# Patient Record
Sex: Male | Born: 1980 | Race: Black or African American | Hispanic: No | Marital: Married | Smoking: Never smoker
Health system: Southern US, Community
[De-identification: ages and names within clinical notes are randomized; demographics above are authoritative.]

## PROBLEM LIST (undated history)

## (undated) DIAGNOSIS — F329 Major depressive disorder, single episode, unspecified: Secondary | ICD-10-CM

## (undated) DIAGNOSIS — I10 Essential (primary) hypertension: Secondary | ICD-10-CM

## (undated) DIAGNOSIS — G4730 Sleep apnea, unspecified: Secondary | ICD-10-CM

## (undated) DIAGNOSIS — F259 Schizoaffective disorder, unspecified: Secondary | ICD-10-CM

## (undated) DIAGNOSIS — E669 Obesity, unspecified: Secondary | ICD-10-CM

## (undated) DIAGNOSIS — F419 Anxiety disorder, unspecified: Secondary | ICD-10-CM

## (undated) DIAGNOSIS — F250 Schizoaffective disorder, bipolar type: Secondary | ICD-10-CM

## (undated) DIAGNOSIS — F32A Depression, unspecified: Secondary | ICD-10-CM

## (undated) DIAGNOSIS — E785 Hyperlipidemia, unspecified: Secondary | ICD-10-CM

## (undated) HISTORY — PX: VASECTOMY: SHX75

## (undated) HISTORY — DX: Sleep apnea, unspecified: G47.30

## (undated) HISTORY — DX: Depression, unspecified: F32.A

## (undated) HISTORY — DX: Major depressive disorder, single episode, unspecified: F32.9

## (undated) HISTORY — DX: Anxiety disorder, unspecified: F41.9

---

## 2014-09-24 ENCOUNTER — Emergency Department (HOSPITAL_COMMUNITY)
Admission: EM | Admit: 2014-09-24 | Discharge: 2014-09-24 | Discharge status: Against Medical Advice | Payer: Medicaid Other | Attending: Emergency Medicine | Admitting: Emergency Medicine

## 2014-09-24 ENCOUNTER — Encounter (HOSPITAL_COMMUNITY): Payer: Self-pay | Admitting: Emergency Medicine

## 2014-09-24 DIAGNOSIS — R0789 Other chest pain: Secondary | ICD-10-CM | POA: Diagnosis not present

## 2014-09-24 DIAGNOSIS — R1013 Epigastric pain: Secondary | ICD-10-CM | POA: Insufficient documentation

## 2014-09-24 DIAGNOSIS — R079 Chest pain, unspecified: Secondary | ICD-10-CM | POA: Diagnosis present

## 2014-09-24 DIAGNOSIS — R42 Dizziness and giddiness: Secondary | ICD-10-CM | POA: Diagnosis not present

## 2014-09-24 HISTORY — DX: Schizoaffective disorder, bipolar type: F25.0

## 2014-09-24 HISTORY — DX: Schizoaffective disorder, unspecified: F25.9

## 2014-09-24 NOTE — ED Notes (Signed)
Intermittent epigastric pain; chest tightness; worse on left side. Feels lightheaded.

## 2014-09-24 NOTE — ED Notes (Signed)
Pt states "I am not going to wait." Pt in NAD.

## 2014-10-19 ENCOUNTER — Telehealth: Payer: Self-pay | Admitting: Neurology

## 2014-10-19 ENCOUNTER — Institutional Professional Consult (permissible substitution): Payer: Self-pay | Admitting: Neurology

## 2014-10-19 NOTE — Telephone Encounter (Signed)
Patient no showed for an appointment today, 10/19/2014, at 0830.

## 2014-10-21 ENCOUNTER — Encounter: Payer: Self-pay | Admitting: Neurology

## 2014-10-23 ENCOUNTER — Ambulatory Visit (HOSPITAL_COMMUNITY): Payer: Self-pay | Admitting: Psychiatry

## 2014-11-05 ENCOUNTER — Ambulatory Visit (HOSPITAL_COMMUNITY): Payer: Medicaid Other | Admitting: Psychiatry

## 2014-11-10 ENCOUNTER — Institutional Professional Consult (permissible substitution): Payer: Medicaid Other | Admitting: Neurology

## 2014-11-10 ENCOUNTER — Encounter: Payer: Self-pay | Admitting: Neurology

## 2014-11-10 ENCOUNTER — Telehealth: Payer: Self-pay | Admitting: Neurology

## 2014-11-10 NOTE — Telephone Encounter (Signed)
Patient is a no show for today's appointment(11/10/14) @10 :00

## 2014-11-13 ENCOUNTER — Encounter: Payer: Self-pay | Admitting: Neurology

## 2014-11-18 ENCOUNTER — Encounter (HOSPITAL_COMMUNITY): Payer: Self-pay | Admitting: Emergency Medicine

## 2014-11-18 ENCOUNTER — Inpatient Hospital Stay (HOSPITAL_COMMUNITY)
Admission: EM | Admit: 2014-11-18 | Discharge: 2014-11-21 | DRG: 392 | Disposition: A | Discharge status: Home / Self Care | Payer: Medicaid Other | Attending: Surgery | Admitting: Surgery

## 2014-11-18 ENCOUNTER — Emergency Department (HOSPITAL_COMMUNITY): Payer: Medicaid Other

## 2014-11-18 DIAGNOSIS — Z79899 Other long term (current) drug therapy: Secondary | ICD-10-CM

## 2014-11-18 DIAGNOSIS — F259 Schizoaffective disorder, unspecified: Secondary | ICD-10-CM | POA: Diagnosis present

## 2014-11-18 DIAGNOSIS — R1032 Left lower quadrant pain: Principal | ICD-10-CM | POA: Diagnosis present

## 2014-11-18 DIAGNOSIS — R109 Unspecified abdominal pain: Secondary | ICD-10-CM

## 2014-11-18 DIAGNOSIS — E119 Type 2 diabetes mellitus without complications: Secondary | ICD-10-CM | POA: Diagnosis present

## 2014-11-18 DIAGNOSIS — E669 Obesity, unspecified: Secondary | ICD-10-CM | POA: Diagnosis present

## 2014-11-18 DIAGNOSIS — G4730 Sleep apnea, unspecified: Secondary | ICD-10-CM | POA: Diagnosis present

## 2014-11-18 DIAGNOSIS — F329 Major depressive disorder, single episode, unspecified: Secondary | ICD-10-CM | POA: Diagnosis present

## 2014-11-18 DIAGNOSIS — Z6841 Body Mass Index (BMI) 40.0 and over, adult: Secondary | ICD-10-CM

## 2014-11-18 HISTORY — DX: Obesity, unspecified: E66.9

## 2014-11-18 LAB — LIPASE, BLOOD: LIPASE: 64 U/L — AB (ref 11–59)

## 2014-11-18 LAB — CBC WITH DIFFERENTIAL/PLATELET
Basophils Absolute: 0 10*3/uL (ref 0.0–0.1)
Basophils Relative: 1 % (ref 0–1)
EOS ABS: 0.1 10*3/uL (ref 0.0–0.7)
Eosinophils Relative: 2 % (ref 0–5)
HCT: 37.6 % — ABNORMAL LOW (ref 39.0–52.0)
HEMOGLOBIN: 12.8 g/dL — AB (ref 13.0–17.0)
Lymphocytes Relative: 34 % (ref 12–46)
Lymphs Abs: 2.2 10*3/uL (ref 0.7–4.0)
MCH: 28.4 pg (ref 26.0–34.0)
MCHC: 34 g/dL (ref 30.0–36.0)
MCV: 83.6 fL (ref 78.0–100.0)
MONO ABS: 0.7 10*3/uL (ref 0.1–1.0)
MONOS PCT: 12 % (ref 3–12)
Neutro Abs: 3.3 10*3/uL (ref 1.7–7.7)
Neutrophils Relative %: 51 % (ref 43–77)
Platelets: 183 10*3/uL (ref 150–400)
RBC: 4.5 MIL/uL (ref 4.22–5.81)
RDW: 13.6 % (ref 11.5–15.5)
WBC: 6.3 10*3/uL (ref 4.0–10.5)

## 2014-11-18 LAB — COMPREHENSIVE METABOLIC PANEL
ALT: 28 U/L (ref 0–53)
ANION GAP: 12 (ref 5–15)
AST: 33 U/L (ref 0–37)
Albumin: 3.6 g/dL (ref 3.5–5.2)
Alkaline Phosphatase: 85 U/L (ref 39–117)
BILIRUBIN TOTAL: 0.2 mg/dL — AB (ref 0.3–1.2)
BUN: 11 mg/dL (ref 6–23)
CO2: 24 mEq/L (ref 19–32)
CREATININE: 1.2 mg/dL (ref 0.50–1.35)
Calcium: 9.2 mg/dL (ref 8.4–10.5)
Chloride: 103 mEq/L (ref 96–112)
GFR calc Af Amer: >90 mL/min (ref >90)
GFR calc non Af Amer: 78 mL/min — ABNORMAL LOW (ref >90)
Glucose, Bld: 107 mg/dL — ABNORMAL HIGH (ref 70–99)
Potassium: 4.1 mEq/L (ref 3.7–5.3)
Sodium: 139 mEq/L (ref 137–147)
Total Protein: 7.4 g/dL (ref 6.0–8.3)

## 2014-11-18 LAB — I-STAT TROPONIN, ED: Troponin i, poc: 0 ng/mL (ref 0.00–0.08)

## 2014-11-18 MED ORDER — FAMOTIDINE IN NACL 20-0.9 MG/50ML-% IV SOLN
20.0000 mg | Freq: Once | INTRAVENOUS | Status: AC
Start: 2014-11-18 — End: 2014-11-18
  Administered 2014-11-18: 20 mg via INTRAVENOUS
  Filled 2014-11-18: qty 50

## 2014-11-18 MED ORDER — MORPHINE SULFATE 4 MG/ML IJ SOLN
4.0000 mg | Freq: Once | INTRAMUSCULAR | Status: AC
  Administered 2014-11-18: 4 mg via INTRAVENOUS
  Filled 2014-11-18: qty 1

## 2014-11-18 MED ORDER — IOHEXOL 300 MG/ML  SOLN
100.0000 mL | Freq: Once | INTRAMUSCULAR | Status: AC | PRN
  Administered 2014-11-18: 100 mL via INTRAVENOUS

## 2014-11-18 MED ORDER — SODIUM CHLORIDE 0.9 % IV BOLUS (SEPSIS)
1000.0000 mL | Freq: Once | INTRAVENOUS | Status: AC
  Administered 2014-11-18: 1000 mL via INTRAVENOUS

## 2014-11-18 NOTE — ED Provider Notes (Signed)
CSN: [id number]     Arrival date & time 11/18/14  2032 History   None    Chief Complaint  Patient presents with  . Abdominal Pain     (Consider location/radiation/quality/duration/timing/severity/associated sxs/prior Treatment) HPI Comments: Patient is a 33 year old male past medical history significant for schizoaffective schizophrenia, diabetes mellitus, anxiety, depression presenting to the emergency department for one week of mid abdominal burning sensation with radiation to left lower quadrant. He does endorse occasional emesis and diarrhea. States his pain has now localized to the left lower quadrant. He denies any fevers or chills. States he has had similar pain in the past, was advised to see a gastroenterologist for endoscopy but never followed up. He has not tried taking any medications at home for his symptoms. No abdominal surgical history.   Past Medical History  Diagnosis Date  . Schizo affective schizophrenia   . Diabetes mellitus without complication   . Sleep apnea   . Anxiety   . Depression   . Obesity    History reviewed. No pertinent past surgical history. No family history on file. History  Substance Use Topics  . Smoking status: Never Smoker   . Smokeless tobacco: Never Used  . Alcohol Use: No    Review of Systems  Gastrointestinal: Positive for nausea, vomiting, abdominal pain and diarrhea.  All other systems reviewed and are negative.     Allergies  Review of patient's allergies indicates no known allergies.  Home Medications   Prior to Admission medications   Medication Sig Start Date End Date Taking? Authorizing Provider  clonazePAM (KLONOPIN) 2 MG tablet Take 2 mg by mouth 2 (two) times daily.   Yes Historical Provider, MD  haloperidol (HALDOL) 5 MG tablet Take 5 mg by mouth at bedtime. 10/06/14  Yes Historical Provider, MD  OXcarbazepine ER 600 MG TB24 Take 1 tablet by mouth 2 (two) times daily.    Yes Historical Provider, MD  QUEtiapine  (SEROQUEL) 300 MG tablet Take 600 mg by mouth daily. 10/06/14  Yes Historical Provider, MD  simvastatin (ZOCOR) 20 MG tablet Take 20 mg by mouth at bedtime. 09/10/14  Yes Historical Provider, MD   BP 144/85 mmHg  Pulse 89  Temp(Src) 98.7 F (37.1 C) (Oral)  Resp 21  Ht 6\' 3"  (1.905 m)  Wt 397 lb (180.078 kg)  BMI 49.62 kg/m2  SpO2 98% Physical Exam  Constitutional: He is oriented to person, place, and time. He appears well-developed and well-nourished. No distress.  HENT:  Head: Normocephalic and atraumatic.  Right Ear: External ear normal.  Left Ear: External ear normal.  Nose: Nose normal.  Mouth/Throat: Oropharynx is clear and moist. No oropharyngeal exudate.  Eyes: Conjunctivae are normal.  Neck: Neck supple.  Cardiovascular: Normal rate, regular rhythm and normal heart sounds.   Pulmonary/Chest: Effort normal and breath sounds normal.  Abdominal: Soft. Bowel sounds are normal. He exhibits no distension. There is tenderness in the periumbilical area, suprapubic area and left lower quadrant. There is no rigidity, no rebound and no guarding.  Musculoskeletal: He exhibits no edema.  Neurological: He is alert and oriented to person, place, and time.  Skin: Skin is warm and dry. He is not diaphoretic.  Nursing note and vitals reviewed.   ED Course  Procedures (including critical care time) Medications  sodium chloride 0.9 % bolus 1,000 mL (0 mLs Intravenous Stopped 11/18/14 2306)  famotidine (PEPCID) IVPB 20 mg (0 mg Intravenous Stopped 11/18/14 2137)  morphine 4 MG/ML injection 4 mg (4 mg  Intravenous Given 11/18/14 2106)  iohexol (OMNIPAQUE) 300 MG/ML solution 100 mL (100 mLs Intravenous Contrast Given 11/18/14 2217)    Labs Review Labs Reviewed  CBC WITH DIFFERENTIAL - Abnormal; Notable for the following:    Hemoglobin 12.8 (*)    HCT 37.6 (*)    All other components within normal limits  COMPREHENSIVE METABOLIC PANEL - Abnormal; Notable for the following:    Glucose,  Bld 107 (*)    Total Bilirubin 0.2 (*)    GFR calc non Af Amer 78 (*)    All other components within normal limits  LIPASE, BLOOD - Abnormal; Notable for the following:    Lipase 64 (*)    All other components within normal limits  URINALYSIS, ROUTINE W REFLEX MICROSCOPIC  I-STAT TROPOININ, ED    Imaging Review Ct Abdomen Pelvis W Contrast  11/18/2014   CLINICAL DATA:  Mid abdominal burning and pain. Emesis and diarrhea. Negative for fever or chills.  EXAM: CT ABDOMEN AND PELVIS WITH CONTRAST  TECHNIQUE: Multidetector CT imaging of the abdomen and pelvis was performed using the standard protocol following bolus administration of intravenous contrast.  CONTRAST:  100mL OMNIPAQUE IOHEXOL 300 MG/ML  SOLN  COMPARISON:  None.  FINDINGS: Lower chest:  Lung bases are clear.  No pleural effusion identified.  Hepatobiliary: There is mild hepatic steatosis noted. The gallbladder appears normal. No biliary dilatation.  Pancreas: The pancreas is unremarkable.  Spleen: The spleen is normal.  Adrenals/Urinary Tract: Normal adrenal glands. The left kidney appears normal. The right kidney is also within normal limits. Urinary bladder appears normal.  Stomach/Bowel: The stomach is within normal limits. The small bowel loops have a normal course and caliber. No obstruction. Normal appearance of the colon. The appendix is visualized in the right lower quadrant. This measures 1.1 cm in maximum diameter, image 36/series 5. The wall of the appendix is slightly indistinct. There is gas identified within the proximal lumen. No significant very appendiceal free fluid.  Vascular/Lymphatic: Normal appearance of the abdominal aorta. No enlarged retroperitoneal or mesenteric adenopathy. No enlarged pelvic or inguinal lymph nodes.  Reproductive: The prostate gland and seminal vesicles are unremarkable.  Other: There is no ascites or focal fluid collections within the abdomen or pelvis.  Musculoskeletal: The visualized osseous  structures are unremarkable.  IMPRESSION: 1. The appearance of the appendix is equivocal for early appendicitis. Early appendicitis cannot be excluded. Correlation for any clinical signs or symptoms of acute appendicitis is recommended. 2. Mild hepatic steatosis.   Electronically Signed   By: Signa Kellaylor  Stroud M.D.   On: 11/18/2014 22:40     EKG Interpretation   Date/Time:  Wednesday November 18 2014 23:48:41 EST Ventricular Rate:  71 PR Interval:  174 QRS Duration: 84 QT Interval:  405 QTC Calculation: 440 R Axis:   10 Text Interpretation:  Sinus rhythm No previous ECGs available Confirmed by  Manus GunningANCOUR  MD, STEPHEN [phone]([id number]) on 11/18/2014 11:51:35 PM      Patient d/w with Dr. Luisa Hartornett who will see the patient in consultation.   MDM   Final diagnoses:  Abdominal pain  Abdominal pain    Filed Vitals:   11/19/14 0015  BP: 144/85  Pulse: 89  Temp:   Resp: 21   Afebrile, NAD, non-toxic appearing, AAOx4.  I have reviewed nursing notes, vital signs, and all appropriate lab and imaging results for this patient. Patient with early appendicitis on Ct scan discussed patient with Dr. Luisa Hartornett who will see and evaluate the patient. He  will admit the patient has service for observation and IV antibiotics. Patient d/w with Dr. Manus Gunning, agrees with plan.      Jeannetta Ellis, PA-C 11/19/14 0025  Glynn Octave, MD 11/19/14 [phone]

## 2014-11-18 NOTE — H&P (Signed)
Allen Sosa is an 33 y.o. male.   Chief Complaint: abdominal pain HPI: pt presents with 1 week history of abdominal pain and diarrhea.  Pain is burning constant and located around umbilicus and LLQ medicine makes it better.  No vomiting but some nausea.  Now wants to eat.  Has had some on off problems with stomach pain. Np blood in hi stool. No RLQ pain.   Past Medical History  Diagnosis Date  . Schizo affective schizophrenia   . Diabetes mellitus without complication   . Sleep apnea   . Anxiety   . Depression   . Obesity     History reviewed. No pertinent past surgical history.  No family history on file. Social History:  reports that he has never smoked. He has never used smokeless tobacco. He reports that he does not drink alcohol or use illicit drugs.  Allergies: No Known Allergies   (Not in a hospital admission)  Results for orders placed or performed during the hospital encounter of 11/18/14 (from the past 48 hour(s))  CBC with Differential     Status: Abnormal   Collection Time: 11/18/14  8:37 PM  Result Value Ref Range   WBC 6.3 4.0 - 10.5 K/uL   RBC 4.50 4.22 - 5.81 MIL/uL   Hemoglobin 12.8 (L) 13.0 - 17.0 g/dL   HCT 37.6 (L) 39.0 - 52.0 %   MCV 83.6 78.0 - 100.0 fL   MCH 28.4 26.0 - 34.0 pg   MCHC 34.0 30.0 - 36.0 g/dL   RDW 13.6 11.5 - 15.5 %   Platelets 183 150 - 400 K/uL   Neutrophils Relative % 51 43 - 77 %   Neutro Abs 3.3 1.7 - 7.7 K/uL   Lymphocytes Relative 34 12 - 46 %   Lymphs Abs 2.2 0.7 - 4.0 K/uL   Monocytes Relative 12 3 - 12 %   Monocytes Absolute 0.7 0.1 - 1.0 K/uL   Eosinophils Relative 2 0 - 5 %   Eosinophils Absolute 0.1 0.0 - 0.7 K/uL   Basophils Relative 1 0 - 1 %   Basophils Absolute 0.0 0.0 - 0.1 K/uL  Comprehensive metabolic panel     Status: Abnormal   Collection Time: 11/18/14  8:37 PM  Result Value Ref Range   Sodium 139 137 - 147 mEq/L   Potassium 4.1 3.7 - 5.3 mEq/L    Comment: HEMOLYSIS AT THIS LEVEL MAY AFFECT RESULT    Chloride 103 96 - 112 mEq/L   CO2 24 19 - 32 mEq/L   Glucose, Bld 107 (H) 70 - 99 mg/dL   BUN 11 6 - 23 mg/dL   Creatinine, Ser 1.20 0.50 - 1.35 mg/dL   Calcium 9.2 8.4 - 10.5 mg/dL   Total Protein 7.4 6.0 - 8.3 g/dL   Albumin 3.6 3.5 - 5.2 g/dL   AST 33 0 - 37 U/L    Comment: HEMOLYSIS AT THIS LEVEL MAY AFFECT RESULT   ALT 28 0 - 53 U/L    Comment: HEMOLYSIS AT THIS LEVEL MAY AFFECT RESULT   Alkaline Phosphatase 85 39 - 117 U/L   Total Bilirubin 0.2 (L) 0.3 - 1.2 mg/dL   GFR calc non Af Amer 78 (L) >90 mL/min   GFR calc Af Amer >90 >90 mL/min    Comment: (NOTE) The eGFR has been calculated using the CKD EPI equation. This calculation has not been validated in all clinical situations. eGFR's persistently <90 mL/min signify possible Chronic Kidney Disease.  Anion gap 12 5 - 15  Lipase, blood     Status: Abnormal   Collection Time: 11/18/14  8:37 PM  Result Value Ref Range   Lipase 64 (H) 11 - 59 U/L  I-stat troponin, ED     Status: None   Collection Time: 11/18/14  9:17 PM  Result Value Ref Range   Troponin i, poc 0.00 0.00 - 0.08 ng/mL   Comment 3            Comment: Due to the release kinetics of cTnI, a negative result within the first hours of the onset of symptoms does not rule out myocardial infarction with certainty. If myocardial infarction is still suspected, repeat the test at appropriate intervals.    Ct Abdomen Pelvis W Contrast  11/18/2014   CLINICAL DATA:  Mid abdominal burning and pain. Emesis and diarrhea. Negative for fever or chills.  EXAM: CT ABDOMEN AND PELVIS WITH CONTRAST  TECHNIQUE: Multidetector CT imaging of the abdomen and pelvis was performed using the standard protocol following bolus administration of intravenous contrast.  CONTRAST:  164m OMNIPAQUE IOHEXOL 300 MG/ML  SOLN  COMPARISON:  None.  FINDINGS: Lower chest:  Lung bases are clear.  No pleural effusion identified.  Hepatobiliary: There is mild hepatic steatosis noted. The gallbladder  appears normal. No biliary dilatation.  Pancreas: The pancreas is unremarkable.  Spleen: The spleen is normal.  Adrenals/Urinary Tract: Normal adrenal glands. The left kidney appears normal. The right kidney is also within normal limits. Urinary bladder appears normal.  Stomach/Bowel: The stomach is within normal limits. The small bowel loops have a normal course and caliber. No obstruction. Normal appearance of the colon. The appendix is visualized in the right lower quadrant. This measures 1.1 cm in maximum diameter, image 36/series 5. The wall of the appendix is slightly indistinct. There is gas identified within the proximal lumen. No significant very appendiceal free fluid.  Vascular/Lymphatic: Normal appearance of the abdominal aorta. No enlarged retroperitoneal or mesenteric adenopathy. No enlarged pelvic or inguinal lymph nodes.  Reproductive: The prostate gland and seminal vesicles are unremarkable.  Other: There is no ascites or focal fluid collections within the abdomen or pelvis.  Musculoskeletal: The visualized osseous structures are unremarkable.  IMPRESSION: 1. The appearance of the appendix is equivocal for early appendicitis. Early appendicitis cannot be excluded. Correlation for any clinical signs or symptoms of acute appendicitis is recommended. 2. Mild hepatic steatosis.   Electronically Signed   By: TKerby MoorsM.D.   On: 11/18/2014 22:40    Review of Systems  Constitutional: Positive for malaise/fatigue. Negative for fever and chills.  Eyes: Negative.   Respiratory: Negative.   Cardiovascular: Negative.   Gastrointestinal: Negative.   Genitourinary: Negative.   Musculoskeletal: Negative.   Skin: Negative.   Neurological: Negative.   Endo/Heme/Allergies: Negative.   Psychiatric/Behavioral: Negative.     Blood pressure 131/70, pulse 75, temperature 98.7 F (37.1 C), temperature source Oral, resp. rate 20, height 6' 3"  (1.905 m), weight 397 lb (180.078 kg), SpO2 94  %. Physical Exam  Constitutional: He is oriented to person, place, and time. He appears well-developed and well-nourished.  HENT:  Head: Normocephalic and atraumatic.  Eyes: Pupils are equal, round, and reactive to light. Scleral icterus is present.  Neck: Normal range of motion.  Cardiovascular: Normal rate and regular rhythm.   Respiratory: Effort normal and breath sounds normal.  GI: Soft. There is tenderness in the left lower quadrant. There is no rigidity, no rebound, no guarding  and no tenderness at McBurney's point.  Musculoskeletal: Normal range of motion.  Neurological: He is alert and oriented to person, place, and time.  Skin: Skin is warm and dry.  Psychiatric: He has a normal mood and affect. His behavior is normal. Judgment and thought content normal.     Assessment/Plan Abdominal pain 1 week LLQ and periumbilical with CT showing possible acute early appendicitis Pt exam and history do not favor appendicitis with pain and diarrhea for a week.  History of abdominal pain.  May have diverticulitis.  Admit for IV abx and follow exam.  May need GI consult to better define.  If he gets worse then laparoscopy.  Pt understands and agrees with plan.   Dontrel Smethers A. 11/18/2014, 11:41 PM

## 2014-11-18 NOTE — ED Notes (Signed)
Pt. arrived with PTAR reports mid abdominal pain " burning " onset this week with occasional emesis and diarrhea , denies fever or chills.

## 2014-11-19 ENCOUNTER — Encounter (HOSPITAL_COMMUNITY): Payer: Self-pay | Admitting: General Practice

## 2014-11-19 DIAGNOSIS — Z6841 Body Mass Index (BMI) 40.0 and over, adult: Secondary | ICD-10-CM | POA: Diagnosis not present

## 2014-11-19 DIAGNOSIS — E119 Type 2 diabetes mellitus without complications: Secondary | ICD-10-CM | POA: Diagnosis present

## 2014-11-19 DIAGNOSIS — R109 Unspecified abdominal pain: Secondary | ICD-10-CM | POA: Diagnosis present

## 2014-11-19 DIAGNOSIS — F329 Major depressive disorder, single episode, unspecified: Secondary | ICD-10-CM | POA: Diagnosis present

## 2014-11-19 DIAGNOSIS — E669 Obesity, unspecified: Secondary | ICD-10-CM | POA: Diagnosis present

## 2014-11-19 DIAGNOSIS — Z79899 Other long term (current) drug therapy: Secondary | ICD-10-CM | POA: Diagnosis not present

## 2014-11-19 DIAGNOSIS — R1032 Left lower quadrant pain: Secondary | ICD-10-CM | POA: Diagnosis present

## 2014-11-19 DIAGNOSIS — F259 Schizoaffective disorder, unspecified: Secondary | ICD-10-CM | POA: Diagnosis present

## 2014-11-19 DIAGNOSIS — G4730 Sleep apnea, unspecified: Secondary | ICD-10-CM | POA: Diagnosis present

## 2014-11-19 LAB — CBC
HCT: 37 % — ABNORMAL LOW (ref 39.0–52.0)
Hemoglobin: 12.6 g/dL — ABNORMAL LOW (ref 13.0–17.0)
MCH: 28.6 pg (ref 26.0–34.0)
MCHC: 34.1 g/dL (ref 30.0–36.0)
MCV: 83.9 fL (ref 78.0–100.0)
PLATELETS: 183 10*3/uL (ref 150–400)
RBC: 4.41 MIL/uL (ref 4.22–5.81)
RDW: 13.5 % (ref 11.5–15.5)
WBC: 5.9 10*3/uL (ref 4.0–10.5)

## 2014-11-19 LAB — COMPREHENSIVE METABOLIC PANEL
ALK PHOS: 69 U/L (ref 39–117)
ALT: 27 U/L (ref 0–53)
AST: 36 U/L (ref 0–37)
Albumin: 3.6 g/dL (ref 3.5–5.2)
Anion gap: 11 (ref 5–15)
BILIRUBIN TOTAL: 0.5 mg/dL (ref 0.3–1.2)
BUN: 10 mg/dL (ref 6–23)
CHLORIDE: 103 meq/L (ref 96–112)
CO2: 27 meq/L (ref 19–32)
Calcium: 8.9 mg/dL (ref 8.4–10.5)
Creatinine, Ser: 1.16 mg/dL (ref 0.50–1.35)
GFR, EST NON AFRICAN AMERICAN: 81 mL/min — AB (ref >90)
GLUCOSE: 93 mg/dL (ref 70–99)
Potassium: 4.4 mEq/L (ref 3.7–5.3)
SODIUM: 141 meq/L (ref 137–147)
Total Protein: 7.1 g/dL (ref 6.0–8.3)

## 2014-11-19 LAB — URINALYSIS, ROUTINE W REFLEX MICROSCOPIC
BILIRUBIN URINE: NEGATIVE
Glucose, UA: NEGATIVE mg/dL
Hgb urine dipstick: NEGATIVE
KETONES UR: NEGATIVE mg/dL
LEUKOCYTES UA: NEGATIVE
Nitrite: NEGATIVE
Protein, ur: NEGATIVE mg/dL
SPECIFIC GRAVITY, URINE: 1.025 (ref 1.005–1.030)
UROBILINOGEN UA: 0.2 mg/dL (ref 0.0–1.0)
pH: 6.5 (ref 5.0–8.0)

## 2014-11-19 LAB — CLOSTRIDIUM DIFFICILE BY PCR: CDIFFPCR: NEGATIVE

## 2014-11-19 MED ORDER — QUETIAPINE FUMARATE 300 MG PO TABS
600.0000 mg | ORAL_TABLET | Freq: Every day | ORAL | Status: DC
  Administered 2014-11-19 – 2014-11-20 (×2): 600 mg via ORAL
  Filled 2014-11-19 (×3): qty 2

## 2014-11-19 MED ORDER — DEXTROSE IN LACTATED RINGERS 5 % IV SOLN
INTRAVENOUS | Status: DC
  Administered 2014-11-19 – 2014-11-20 (×3): via INTRAVENOUS

## 2014-11-19 MED ORDER — SIMVASTATIN 20 MG PO TABS
20.0000 mg | ORAL_TABLET | Freq: Every day | ORAL | Status: DC
  Administered 2014-11-19 – 2014-11-20 (×3): 20 mg via ORAL
  Filled 2014-11-19 (×4): qty 1
  Filled 2014-11-19: qty 2

## 2014-11-19 MED ORDER — ENOXAPARIN SODIUM 100 MG/ML ~~LOC~~ SOLN
90.0000 mg | SUBCUTANEOUS | Status: DC
  Administered 2014-11-19 – 2014-11-20 (×2): 90 mg via SUBCUTANEOUS
  Filled 2014-11-19 (×3): qty 1

## 2014-11-19 MED ORDER — OXYCODONE HCL 5 MG PO TABS: 5.0000 mg | ORAL_TABLET | ORAL | Status: DC | PRN

## 2014-11-19 MED ORDER — PIPERACILLIN-TAZOBACTAM 3.375 G IVPB 30 MIN
3.3750 g | Freq: Once | INTRAVENOUS | Status: AC
  Administered 2014-11-19: 3.375 g via INTRAVENOUS
  Filled 2014-11-19: qty 50

## 2014-11-19 MED ORDER — QUETIAPINE FUMARATE 300 MG PO TABS
600.0000 mg | ORAL_TABLET | Freq: Every day | ORAL | Status: DC
  Filled 2014-11-19: qty 2

## 2014-11-19 MED ORDER — HYDROMORPHONE HCL 1 MG/ML IJ SOLN
1.0000 mg | INTRAMUSCULAR | Status: DC | PRN
  Administered 2014-11-19: 1 mg via INTRAVENOUS
  Filled 2014-11-19: qty 1

## 2014-11-19 MED ORDER — ONDANSETRON HCL 4 MG/2ML IJ SOLN: 4.0000 mg | Freq: Four times a day (QID) | INTRAMUSCULAR | Status: DC | PRN

## 2014-11-19 MED ORDER — PIPERACILLIN-TAZOBACTAM 3.375 G IVPB
3.3750 g | Freq: Three times a day (TID) | INTRAVENOUS | Status: DC
  Administered 2014-11-19 – 2014-11-21 (×7): 3.375 g via INTRAVENOUS
  Filled 2014-11-19 (×8): qty 50

## 2014-11-19 MED ORDER — HALOPERIDOL 5 MG PO TABS
5.0000 mg | ORAL_TABLET | Freq: Every day | ORAL | Status: DC
  Administered 2014-11-19 – 2014-11-20 (×3): 5 mg via ORAL
  Filled 2014-11-19 (×5): qty 1

## 2014-11-19 MED ORDER — CLONAZEPAM 1 MG PO TABS
2.0000 mg | ORAL_TABLET | Freq: Two times a day (BID) | ORAL | Status: DC
  Administered 2014-11-19 – 2014-11-21 (×6): 2 mg via ORAL
  Filled 2014-11-19 (×6): qty 2

## 2014-11-19 NOTE — Progress Notes (Signed)
Patient ID: Allen Sosa, male   DOB: 11-21-1981, 33 y.o.   MRN: [id number] [facility] Surgery Progress Note:   * No surgery found *  Subjective: Mental status is clear.  Patient interviewed with wife on speaker phone.  Pain is better but definitely on the left side.   Objective: Vital signs in last 24 hours: Temp:  [97.6 F (36.4 C)-98.7 F (37.1 C)] 97.6 F (36.4 C) (11/26 0558) Pulse Rate:  [60-98] 60 (11/26 0558) Resp:  [0-31] 19 (11/26 0558) BP: (119-166)/(62-128) 122/83 mmHg (11/26 0558) SpO2:  [93 %-98 %] 96 % (11/26 0558) Weight:  [397 lb (180.078 kg)-400 lb 12.8 oz (181.802 kg)] 400 lb 12.8 oz (181.802 kg) (11/26 0038)  Intake/Output from previous day: 11/25 0701 - 11/26 0700 In: 1246.3 [P.O.:840; I.V.:356.3; IV Piggyback:50] Out: 825 [Urine:825] Intake/Output this shift:    Physical Exam: Work of breathing is normal.  Abdomen is obese;  Complains of pain in the LLQ but no rebound or guarding.    Lab Results:  Results for orders placed or performed during the hospital encounter of 11/18/14 (from the past 48 hour(s))  CBC with Differential     Status: Abnormal   Collection Time: 11/18/14  8:37 PM  Result Value Ref Range   WBC 6.3 4.0 - 10.5 K/uL   RBC 4.50 4.22 - 5.81 MIL/uL   Hemoglobin 12.8 (L) 13.0 - 17.0 g/dL   HCT 37.6 (L) 39.0 - 52.0 %   MCV 83.6 78.0 - 100.0 fL   MCH 28.4 26.0 - 34.0 pg   MCHC 34.0 30.0 - 36.0 g/dL   RDW 13.6 11.5 - 15.5 %   Platelets 183 150 - 400 K/uL   Neutrophils Relative % 51 43 - 77 %   Neutro Abs 3.3 1.7 - 7.7 K/uL   Lymphocytes Relative 34 12 - 46 %   Lymphs Abs 2.2 0.7 - 4.0 K/uL   Monocytes Relative 12 3 - 12 %   Monocytes Absolute 0.7 0.1 - 1.0 K/uL   Eosinophils Relative 2 0 - 5 %   Eosinophils Absolute 0.1 0.0 - 0.7 K/uL   Basophils Relative 1 0 - 1 %   Basophils Absolute 0.0 0.0 - 0.1 K/uL  Comprehensive metabolic panel     Status: Abnormal   Collection Time: 11/18/14  8:37 PM  Result Value Ref Range   Sodium  139 137 - 147 mEq/L   Potassium 4.1 3.7 - 5.3 mEq/L    Comment: HEMOLYSIS AT THIS LEVEL MAY AFFECT RESULT   Chloride 103 96 - 112 mEq/L   CO2 24 19 - 32 mEq/L   Glucose, Bld 107 (H) 70 - 99 mg/dL   BUN 11 6 - 23 mg/dL   Creatinine, Ser 1.20 0.50 - 1.35 mg/dL   Calcium 9.2 8.4 - 10.5 mg/dL   Total Protein 7.4 6.0 - 8.3 g/dL   Albumin 3.6 3.5 - 5.2 g/dL   AST 33 0 - 37 U/L    Comment: HEMOLYSIS AT THIS LEVEL MAY AFFECT RESULT   ALT 28 0 - 53 U/L    Comment: HEMOLYSIS AT THIS LEVEL MAY AFFECT RESULT   Alkaline Phosphatase 85 39 - 117 U/L   Total Bilirubin 0.2 (L) 0.3 - 1.2 mg/dL   GFR calc non Af Amer 78 (L) >90 mL/min   GFR calc Af Amer >90 >90 mL/min    Comment: (NOTE) The eGFR has been calculated using the CKD EPI equation. This calculation has not been validated in  all clinical situations. eGFR's persistently <90 mL/min signify possible Chronic Kidney Disease.    Anion gap 12 5 - 15  Lipase, blood     Status: Abnormal   Collection Time: 11/18/14  8:37 PM  Result Value Ref Range   Lipase 64 (H) 11 - 59 U/L  I-stat troponin, ED     Status: None   Collection Time: 11/18/14  9:17 PM  Result Value Ref Range   Troponin i, poc 0.00 0.00 - 0.08 ng/mL   Comment 3            Comment: Due to the release kinetics of cTnI, a negative result within the first hours of the onset of symptoms does not rule out myocardial infarction with certainty. If myocardial infarction is still suspected, repeat the test at appropriate intervals.   Urinalysis, Routine w reflex microscopic     Status: None   Collection Time: 11/18/14 11:43 PM  Result Value Ref Range   Color, Urine YELLOW YELLOW   APPearance CLEAR CLEAR   Specific Gravity, Urine 1.025 1.005 - 1.030   pH 6.5 5.0 - 8.0   Glucose, UA NEGATIVE NEGATIVE mg/dL   Hgb urine dipstick NEGATIVE NEGATIVE   Bilirubin Urine NEGATIVE NEGATIVE   Ketones, ur NEGATIVE NEGATIVE mg/dL   Protein, ur NEGATIVE NEGATIVE mg/dL   Urobilinogen, UA 0.2 0.0  - 1.0 mg/dL   Nitrite NEGATIVE NEGATIVE   Leukocytes, UA NEGATIVE NEGATIVE    Comment: MICROSCOPIC NOT DONE ON URINES WITH NEGATIVE PROTEIN, BLOOD, LEUKOCYTES, NITRITE, OR GLUCOSE <1000 mg/dL.  Comprehensive metabolic panel     Status: Abnormal   Collection Time: 11/19/14  4:05 AM  Result Value Ref Range   Sodium 141 137 - 147 mEq/L   Potassium 4.4 3.7 - 5.3 mEq/L    Comment: HEMOLYSIS AT THIS LEVEL MAY AFFECT RESULT   Chloride 103 96 - 112 mEq/L   CO2 27 19 - 32 mEq/L   Glucose, Bld 93 70 - 99 mg/dL   BUN 10 6 - 23 mg/dL   Creatinine, Ser 1.16 0.50 - 1.35 mg/dL   Calcium 8.9 8.4 - 10.5 mg/dL   Total Protein 7.1 6.0 - 8.3 g/dL   Albumin 3.6 3.5 - 5.2 g/dL   AST 36 0 - 37 U/L   ALT 27 0 - 53 U/L   Alkaline Phosphatase 69 39 - 117 U/L   Total Bilirubin 0.5 0.3 - 1.2 mg/dL   GFR calc non Af Amer 81 (L) >90 mL/min   GFR calc Af Amer >90 >90 mL/min    Comment: (NOTE) The eGFR has been calculated using the CKD EPI equation. This calculation has not been validated in all clinical situations. eGFR's persistently <90 mL/min signify possible Chronic Kidney Disease.    Anion gap 11 5 - 15  CBC     Status: Abnormal   Collection Time: 11/19/14  4:05 AM  Result Value Ref Range   WBC 5.9 4.0 - 10.5 K/uL   RBC 4.41 4.22 - 5.81 MIL/uL   Hemoglobin 12.6 (L) 13.0 - 17.0 g/dL   HCT 37.0 (L) 39.0 - 52.0 %   MCV 83.9 78.0 - 100.0 fL   MCH 28.6 26.0 - 34.0 pg   MCHC 34.1 30.0 - 36.0 g/dL   RDW 13.5 11.5 - 15.5 %   Platelets 183 150 - 400 K/uL    Radiology/Results: Ct Abdomen Pelvis W Contrast  11/18/2014   CLINICAL DATA:  Mid abdominal burning and pain. Emesis and diarrhea. Negative  for fever or chills.  EXAM: CT ABDOMEN AND PELVIS WITH CONTRAST  TECHNIQUE: Multidetector CT imaging of the abdomen and pelvis was performed using the standard protocol following bolus administration of intravenous contrast.  CONTRAST:  115m OMNIPAQUE IOHEXOL 300 MG/ML  SOLN  COMPARISON:  None.  FINDINGS: Lower  chest:  Lung bases are clear.  No pleural effusion identified.  Hepatobiliary: There is mild hepatic steatosis noted. The gallbladder appears normal. No biliary dilatation.  Pancreas: The pancreas is unremarkable.  Spleen: The spleen is normal.  Adrenals/Urinary Tract: Normal adrenal glands. The left kidney appears normal. The right kidney is also within normal limits. Urinary bladder appears normal.  Stomach/Bowel: The stomach is within normal limits. The small bowel loops have a normal course and caliber. No obstruction. Normal appearance of the colon. The appendix is visualized in the right lower quadrant. This measures 1.1 cm in maximum diameter, image 36/series 5. The wall of the appendix is slightly indistinct. There is gas identified within the proximal lumen. No significant very appendiceal free fluid.  Vascular/Lymphatic: Normal appearance of the abdominal aorta. No enlarged retroperitoneal or mesenteric adenopathy. No enlarged pelvic or inguinal lymph nodes.  Reproductive: The prostate gland and seminal vesicles are unremarkable.  Other: There is no ascites or focal fluid collections within the abdomen or pelvis.  Musculoskeletal: The visualized osseous structures are unremarkable.  IMPRESSION: 1. The appearance of the appendix is equivocal for early appendicitis. Early appendicitis cannot be excluded. Correlation for any clinical signs or symptoms of acute appendicitis is recommended. 2. Mild hepatic steatosis.   Electronically Signed   By: TKerby MoorsM.D.   On: 11/18/2014 22:40    Anti-infectives: Anti-infectives    Start     Dose/Rate Route Frequency Ordered Stop   11/19/14 0800  piperacillin-tazobactam (ZOSYN) IVPB 3.375 g     3.375 g12.5 mL/hr over 240 Minutes Intravenous Every 8 hours 11/19/14 0032     11/19/14 0045  piperacillin-tazobactam (ZOSYN) IVPB 3.375 g     3.375 g100 mL/hr over 30 Minutes Intravenous  Once 11/19/14 0035 11/19/14 0207      Assessment/Plan: Problem  List: Patient Active Problem List   Diagnosis Date Noted  . Abdominal pain in male 11/18/2014    Patient has a history of diarrhea after eating and not constipation.  Pain location more like left colon and diverticulitis.  Would continue observation and Zosyn.  Patient agrees.  * No surgery found *    LOS: 1 day   Matt B. MHassell Done MD, FCharlton Memorial HospitalSurgery, P.A. [phone] 11/19/2014 8:19 AM

## 2014-11-19 NOTE — Progress Notes (Signed)
UR completed 

## 2014-11-20 DIAGNOSIS — R109 Unspecified abdominal pain: Secondary | ICD-10-CM

## 2014-11-20 NOTE — Progress Notes (Signed)
Patient ID: Allen Sosa, male   DOB: 04/29/1981, 33 y.o.   MRN: [id number] [facility] Progress Note:   * No surgery found *  Subjective: Mental status is alert.  Patient is hungry and wants to eat solids.  Not complaining of abdominal pain Objective: Vital signs in last 24 hours: Temp:  [98 F (36.7 C)-98.6 F (37 C)] 98 F (36.7 C) (11/27 0606) Pulse Rate:  [60-70] 68 (11/27 0606) Resp:  [14-19] 18 (11/27 0606) BP: (107-146)/(59-90) 122/70 mmHg (11/27 0606) SpO2:  [97 %-100 %] 99 % (11/27 0606)  Intake/Output from previous day: 11/26 0701 - 11/27 0700 In: 2134.5 [P.O.:220; I.V.:1814.5; IV Piggyback:100] Out: -  Intake/Output this shift:    Physical Exam: Work of breathing is normal.  Nontender abdomen exam  Lab Results:  Results for orders placed or performed during the hospital encounter of 11/18/14 (from the past 48 hour(s))  CBC with Differential     Status: Abnormal   Collection Time: 11/18/14  8:37 PM  Result Value Ref Range   WBC 6.3 4.0 - 10.5 K/uL   RBC 4.50 4.22 - 5.81 MIL/uL   Hemoglobin 12.8 (L) 13.0 - 17.0 g/dL   HCT 37.6 (L) 39.0 - 52.0 %   MCV 83.6 78.0 - 100.0 fL   MCH 28.4 26.0 - 34.0 pg   MCHC 34.0 30.0 - 36.0 g/dL   RDW 13.6 11.5 - 15.5 %   Platelets 183 150 - 400 K/uL   Neutrophils Relative % 51 43 - 77 %   Neutro Abs 3.3 1.7 - 7.7 K/uL   Lymphocytes Relative 34 12 - 46 %   Lymphs Abs 2.2 0.7 - 4.0 K/uL   Monocytes Relative 12 3 - 12 %   Monocytes Absolute 0.7 0.1 - 1.0 K/uL   Eosinophils Relative 2 0 - 5 %   Eosinophils Absolute 0.1 0.0 - 0.7 K/uL   Basophils Relative 1 0 - 1 %   Basophils Absolute 0.0 0.0 - 0.1 K/uL  Comprehensive metabolic panel     Status: Abnormal   Collection Time: 11/18/14  8:37 PM  Result Value Ref Range   Sodium 139 137 - 147 mEq/L   Potassium 4.1 3.7 - 5.3 mEq/L    Comment: HEMOLYSIS AT THIS LEVEL MAY AFFECT RESULT   Chloride 103 96 - 112 mEq/L   CO2 24 19 - 32 mEq/L   Glucose, Bld 107 (H) 70 - 99  mg/dL   BUN 11 6 - 23 mg/dL   Creatinine, Ser 1.20 0.50 - 1.35 mg/dL   Calcium 9.2 8.4 - 10.5 mg/dL   Total Protein 7.4 6.0 - 8.3 g/dL   Albumin 3.6 3.5 - 5.2 g/dL   AST 33 0 - 37 U/L    Comment: HEMOLYSIS AT THIS LEVEL MAY AFFECT RESULT   ALT 28 0 - 53 U/L    Comment: HEMOLYSIS AT THIS LEVEL MAY AFFECT RESULT   Alkaline Phosphatase 85 39 - 117 U/L   Total Bilirubin 0.2 (L) 0.3 - 1.2 mg/dL   GFR calc non Af Amer 78 (L) >90 mL/min   GFR calc Af Amer >90 >90 mL/min    Comment: (NOTE) The eGFR has been calculated using the CKD EPI equation. This calculation has not been validated in all clinical situations. eGFR's persistently <90 mL/min signify possible Chronic Kidney Disease.    Anion gap 12 5 - 15  Lipase, blood     Status: Abnormal   Collection Time: 11/18/14  8:37 PM  Result Value Ref Range   Lipase 64 (H) 11 - 59 U/L  I-stat troponin, ED     Status: None   Collection Time: 11/18/14  9:17 PM  Result Value Ref Range   Troponin i, poc 0.00 0.00 - 0.08 ng/mL   Comment 3            Comment: Due to the release kinetics of cTnI, a negative result within the first hours of the onset of symptoms does not rule out myocardial infarction with certainty. If myocardial infarction is still suspected, repeat the test at appropriate intervals.   Urinalysis, Routine w reflex microscopic     Status: None   Collection Time: 11/18/14 11:43 PM  Result Value Ref Range   Color, Urine YELLOW YELLOW   APPearance CLEAR CLEAR   Specific Gravity, Urine 1.025 1.005 - 1.030   pH 6.5 5.0 - 8.0   Glucose, UA NEGATIVE NEGATIVE mg/dL   Hgb urine dipstick NEGATIVE NEGATIVE   Bilirubin Urine NEGATIVE NEGATIVE   Ketones, ur NEGATIVE NEGATIVE mg/dL   Protein, ur NEGATIVE NEGATIVE mg/dL   Urobilinogen, UA 0.2 0.0 - 1.0 mg/dL   Nitrite NEGATIVE NEGATIVE   Leukocytes, UA NEGATIVE NEGATIVE    Comment: MICROSCOPIC NOT DONE ON URINES WITH NEGATIVE PROTEIN, BLOOD, LEUKOCYTES, NITRITE, OR GLUCOSE <1000  mg/dL.  Comprehensive metabolic panel     Status: Abnormal   Collection Time: 11/19/14  4:05 AM  Result Value Ref Range   Sodium 141 137 - 147 mEq/L   Potassium 4.4 3.7 - 5.3 mEq/L    Comment: HEMOLYSIS AT THIS LEVEL MAY AFFECT RESULT   Chloride 103 96 - 112 mEq/L   CO2 27 19 - 32 mEq/L   Glucose, Bld 93 70 - 99 mg/dL   BUN 10 6 - 23 mg/dL   Creatinine, Ser 1.16 0.50 - 1.35 mg/dL   Calcium 8.9 8.4 - 10.5 mg/dL   Total Protein 7.1 6.0 - 8.3 g/dL   Albumin 3.6 3.5 - 5.2 g/dL   AST 36 0 - 37 U/L   ALT 27 0 - 53 U/L   Alkaline Phosphatase 69 39 - 117 U/L   Total Bilirubin 0.5 0.3 - 1.2 mg/dL   GFR calc non Af Amer 81 (L) >90 mL/min   GFR calc Af Amer >90 >90 mL/min    Comment: (NOTE) The eGFR has been calculated using the CKD EPI equation. This calculation has not been validated in all clinical situations. eGFR's persistently <90 mL/min signify possible Chronic Kidney Disease.    Anion gap 11 5 - 15  CBC     Status: Abnormal   Collection Time: 11/19/14  4:05 AM  Result Value Ref Range   WBC 5.9 4.0 - 10.5 K/uL   RBC 4.41 4.22 - 5.81 MIL/uL   Hemoglobin 12.6 (L) 13.0 - 17.0 g/dL   HCT 37.0 (L) 39.0 - 52.0 %   MCV 83.9 78.0 - 100.0 fL   MCH 28.6 26.0 - 34.0 pg   MCHC 34.1 30.0 - 36.0 g/dL   RDW 13.5 11.5 - 15.5 %   Platelets 183 150 - 400 K/uL  Clostridium Difficile by PCR     Status: None   Collection Time: 11/19/14 10:50 AM  Result Value Ref Range   C difficile by pcr NEGATIVE NEGATIVE    Radiology/Results: Ct Abdomen Pelvis W Contrast  11/18/2014   CLINICAL DATA:  Mid abdominal burning and pain. Emesis and diarrhea. Negative for fever or chills.  EXAM: CT ABDOMEN  AND PELVIS WITH CONTRAST  TECHNIQUE: Multidetector CT imaging of the abdomen and pelvis was performed using the standard protocol following bolus administration of intravenous contrast.  CONTRAST:  122m OMNIPAQUE IOHEXOL 300 MG/ML  SOLN  COMPARISON:  None.  FINDINGS: Lower chest:  Lung bases are clear.  No  pleural effusion identified.  Hepatobiliary: There is mild hepatic steatosis noted. The gallbladder appears normal. No biliary dilatation.  Pancreas: The pancreas is unremarkable.  Spleen: The spleen is normal.  Adrenals/Urinary Tract: Normal adrenal glands. The left kidney appears normal. The right kidney is also within normal limits. Urinary bladder appears normal.  Stomach/Bowel: The stomach is within normal limits. The small bowel loops have a normal course and caliber. No obstruction. Normal appearance of the colon. The appendix is visualized in the right lower quadrant. This measures 1.1 cm in maximum diameter, image 36/series 5. The wall of the appendix is slightly indistinct. There is gas identified within the proximal lumen. No significant very appendiceal free fluid.  Vascular/Lymphatic: Normal appearance of the abdominal aorta. No enlarged retroperitoneal or mesenteric adenopathy. No enlarged pelvic or inguinal lymph nodes.  Reproductive: The prostate gland and seminal vesicles are unremarkable.  Other: There is no ascites or focal fluid collections within the abdomen or pelvis.  Musculoskeletal: The visualized osseous structures are unremarkable.  IMPRESSION: 1. The appearance of the appendix is equivocal for early appendicitis. Early appendicitis cannot be excluded. Correlation for any clinical signs or symptoms of acute appendicitis is recommended. 2. Mild hepatic steatosis.   Electronically Signed   By: TKerby MoorsM.D.   On: 11/18/2014 22:40    Anti-infectives: Anti-infectives    Start     Dose/Rate Route Frequency Ordered Stop   11/19/14 0800  piperacillin-tazobactam (ZOSYN) IVPB 3.375 g     3.375 g12.5 mL/hr over 240 Minutes Intravenous Every 8 hours 11/19/14 0032     11/19/14 0045  piperacillin-tazobactam (ZOSYN) IVPB 3.375 g     3.375 g100 mL/hr over 30 Minutes Intravenous  Once 11/19/14 0035 11/19/14 0207      Assessment/Plan: Problem List: Patient Active Problem List    Diagnosis Date Noted  . Abdominal pain 11/20/2014  . Abdominal pain in male 11/18/2014    Lab OK.  Pain resolved.  Hungry.  Will advance diet  * No surgery found *    LOS: 2 days   Matt B. MHassell Done MD, FPalms [facility], [facility] [phone] 11/20/2014 9:08 AM

## 2014-11-21 NOTE — Discharge Summary (Signed)
Physician Discharge Summary  Patient ID: Allen RenMichael Phillippi V MRN: [id number] DOB/AGE: May 13, 1981 33 y.o.  Admit date: 11/18/2014 Discharge date: 11/21/2014  Admission Diagnoses:  Abdominal pain LLQ  Discharge Diagnoses:  Same pain resolved  Active Problems:   Abdominal pain in male   Abdominal pain   Surgery:  none  Discharged Condition: improved;  No pain and diet resumed  Hospital Course:   Admitted for observation.  Pain was in LLQ initially and after one day it resolved.  Began eating and advanced to regular diet.  Dressed and ready to go home  Consults: none  Significant Diagnostic Studies: CT scan    Discharge Exam: Blood pressure 125/80, pulse 98, temperature 98 F (36.7 C), temperature source Oral, resp. rate 17, height 6\' 3"  (1.905 m), weight 400 lb 12.8 oz (181.802 kg), SpO2 98 %. Abdomen is nontender  Disposition: 07-Left Against Medical Advice  Discharge Instructions    Diet - low sodium heart healthy    Complete by:  As directed      Discharge instructions    Complete by:  As directed   Resume diet and home medications.     Increase activity slowly    Complete by:  As directed             Medication List    TAKE these medications        clonazePAM 2 MG tablet  Commonly known as:  KLONOPIN  Take 2 mg by mouth 2 (two) times daily.     haloperidol 5 MG tablet  Commonly known as:  HALDOL  Take 5 mg by mouth at bedtime.     OXcarbazepine ER 600 MG Tb24  Take 1 tablet by mouth 2 (two) times daily.     QUEtiapine 300 MG tablet  Commonly known as:  SEROQUEL  Take 600 mg by mouth daily.     simvastatin 20 MG tablet  Commonly known as:  ZOCOR  Take 20 mg by mouth at bedtime.           Follow-up Information    Follow up with [facility] [place]. Schedule an appointment as soon as possible for a visit in 3 weeks.   Why:  As needed   Contact information:   [address]   [place] [address] [phone]        Signed: Valarie MerinoMARTIN,Gokul Waybright B 11/21/2014, 9:32 AM

## 2014-11-21 NOTE — Discharge Instructions (Signed)
Resume you meds and your diet

## 2014-11-23 LAB — STOOL CULTURE: SPECIAL REQUESTS: NORMAL

## 2014-12-03 ENCOUNTER — Emergency Department (HOSPITAL_COMMUNITY)
Admission: EM | Admit: 2014-12-03 | Discharge: 2014-12-04 | Disposition: A | Discharge status: Home / Self Care | Payer: Medicaid Other | Attending: Emergency Medicine | Admitting: Emergency Medicine

## 2014-12-03 ENCOUNTER — Encounter (HOSPITAL_COMMUNITY): Payer: Self-pay | Admitting: Emergency Medicine

## 2014-12-03 DIAGNOSIS — R109 Unspecified abdominal pain: Secondary | ICD-10-CM | POA: Diagnosis not present

## 2014-12-03 DIAGNOSIS — F419 Anxiety disorder, unspecified: Secondary | ICD-10-CM | POA: Diagnosis not present

## 2014-12-03 DIAGNOSIS — R05 Cough: Secondary | ICD-10-CM | POA: Diagnosis present

## 2014-12-03 DIAGNOSIS — E669 Obesity, unspecified: Secondary | ICD-10-CM | POA: Insufficient documentation

## 2014-12-03 DIAGNOSIS — Z8669 Personal history of other diseases of the nervous system and sense organs: Secondary | ICD-10-CM | POA: Insufficient documentation

## 2014-12-03 DIAGNOSIS — F329 Major depressive disorder, single episode, unspecified: Secondary | ICD-10-CM | POA: Diagnosis not present

## 2014-12-03 DIAGNOSIS — E119 Type 2 diabetes mellitus without complications: Secondary | ICD-10-CM | POA: Insufficient documentation

## 2014-12-03 DIAGNOSIS — Z79899 Other long term (current) drug therapy: Secondary | ICD-10-CM | POA: Diagnosis not present

## 2014-12-03 DIAGNOSIS — F250 Schizoaffective disorder, bipolar type: Secondary | ICD-10-CM | POA: Diagnosis not present

## 2014-12-03 DIAGNOSIS — J069 Acute upper respiratory infection, unspecified: Secondary | ICD-10-CM | POA: Diagnosis not present

## 2014-12-03 NOTE — ED Notes (Signed)
Pt. reports nasal congestion , productive cough , fatigue and body aches for several days , denies fever or chills.

## 2014-12-03 NOTE — ED Provider Notes (Signed)
CSN: [id number]     Arrival date & time 12/03/14  2346 History  This chart was scribed for non-physician practitioner, Joycie PeekBenjamin Kersten Salmons, PA-C, working with Purvis SheffieldForrest Harrison, MD, by Bronson CurbJacqueline Melvin, ED Scribe. This patient was seen in room TR04C/TR04C and the patient's care was started at 11:54 PM.   Chief Complaint  Patient presents with  . Nasal Congestion  . Cough    The history is provided by the patient. No language interpreter was used.     HPI Comments: Allen Sosa is a 33 y.o. male who presents to the Emergency Department complaining of flu-like symptoms for the past 3 days. There is associated "itchy" throat, abdominal pain, nausea, one episode of vomiting, intermittent generalized body aches, rhinorrhea, nasal congestion, postnasal drip, and cough. Patient reports taking prescription pain pill for relief. He also reports taking a prescription nasal spray with some relief. He reports sick contacts, stating his 33 year old son is experiencing similar symptoms. He is unsure of fever, but denies diarrhea, constipation, hematemesis. Patient reports past medical history of schizo affective schizophrenia for which he takes medications. He has not received the flu vaccination. Patient is established with a PCP. No other modifying factors   Past Medical History  Diagnosis Date  . Schizo affective schizophrenia   . Diabetes mellitus without complication   . Sleep apnea   . Anxiety   . Depression   . Obesity    History reviewed. No pertinent past surgical history. No family history on file. History  Substance Use Topics  . Smoking status: Never Smoker   . Smokeless tobacco: Never Used  . Alcohol Use: No    Review of Systems  HENT: Positive for congestion and postnasal drip.   Respiratory: Positive for cough. Negative for stridor.   Gastrointestinal: Positive for nausea, vomiting and abdominal pain. Negative for diarrhea and constipation.  Musculoskeletal: Positive for  myalgias.  All other systems reviewed and are negative.     Allergies  Review of patient's allergies indicates no known allergies.  Home Medications   Prior to Admission medications   Medication Sig Start Date End Date Taking? Authorizing Provider  chlorpheniramine-HYDROcodone (TUSSIONEX PENNKINETIC ER) 10-8 MG/5ML LQCR Take 5 mLs by mouth every 12 (twelve) hours as needed for cough (Cough). 12/04/14   Earle GellBenjamin W Leshae Mcclay, PA-C  clonazePAM (KLONOPIN) 2 MG tablet Take 2 mg by mouth 2 (two) times daily.    Historical Provider, MD  diphenhydrAMINE (BENADRYL) 25 MG tablet Take 2 tablets (50 mg total) by mouth every 4 (four) hours as needed for itching. 12/04/14   Earle GellBenjamin W Miliyah Luper, PA-C  haloperidol (HALDOL) 5 MG tablet Take 5 mg by mouth at bedtime. 10/06/14   Historical Provider, MD  OXcarbazepine ER 600 MG TB24 Take 1 tablet by mouth 2 (two) times daily.     Historical Provider, MD  QUEtiapine (SEROQUEL) 300 MG tablet Take 600 mg by mouth daily. 10/06/14   Historical Provider, MD  simvastatin (ZOCOR) 20 MG tablet Take 20 mg by mouth at bedtime. 09/10/14   Historical Provider, MD   Triage Vitals: BP 149/81 mmHg  Pulse 73  Temp(Src) 98.7 F (37.1 C) (Oral)  Resp 18  SpO2 96%  Physical Exam  Constitutional: He is oriented to person, place, and time. He appears well-developed and well-nourished. No distress.  Obese  HENT:  Head: Normocephalic and atraumatic.  Mouth/Throat: Oropharynx is clear and moist. No oropharyngeal exudate.  Eyes: Conjunctivae and EOM are normal.  Neck: Normal range of motion.  Neck supple. No tracheal deviation present.  No meningismus  Cardiovascular: Normal rate, regular rhythm and normal heart sounds.  Exam reveals no gallop and no friction rub.   No murmur heard. Pulmonary/Chest: Effort normal and breath sounds normal. No respiratory distress.  Clear to auscultation bilaterally.  Abdominal: Soft. There is no tenderness.  Musculoskeletal: Normal range of  motion.  Lymphadenopathy:    He has no cervical adenopathy.  Neurological: He is alert and oriented to person, place, and time.  Skin: Skin is warm and dry.  Psychiatric: He has a normal mood and affect. His behavior is normal.  Nursing note and vitals reviewed.   ED Course  Procedures (including critical care time)  DIAGNOSTIC STUDIES: Oxygen Saturation is 96% on room air, adequate by my interpretation.    COORDINATION OF CARE: At 0002 Discussed treatment plan with patient. Patient agrees.   Labs Review Labs Reviewed - No data to display  Imaging Review No results found.   EKG Interpretation None     Filed Vitals:   12/03/14 2350 12/04/14 0030  BP: 149/81 143/86  Pulse: 73 72  Temp: 98.7 F (37.1 C) 98.6 F (37 C)  TempSrc: Oral Oral  Resp: 18 18  SpO2: 96% 95%    MDM  Vitals stable - WNL -afebrile Pt resting comfortably in ED. PE-not concerning further acute or emergent pathology. Benign lung exam, patient with moist mucous membranes, benign abdominal exam. No facial pain Patient likely suffering from viral process. Low concern for acute or emergent bacterial process at this time Encouraged symptomatic support at this time. Will DC with cough medicine, Benadryl, recommended OTC medications. Discussed f/u with PCP and return precautions, pt very amenable to plan. Patient stable, in good condition and is appropriate for discharge   Final diagnoses:  URI (upper respiratory infection)    I personally performed the services described in this documentation, which was scribed in my presence. The recorded information has been reviewed and is accurate.     Earle GellBenjamin W Glendaleartner, PA-C 12/04/14 2035  Purvis SheffieldForrest Harrison, MD 12/04/14 2046

## 2014-12-04 MED ORDER — HYDROCOD POLST-CHLORPHEN POLST 10-8 MG/5ML PO LQCR
5.0000 mL | Freq: Two times a day (BID) | ORAL | Status: DC | PRN
Start: 2014-12-04 — End: 2015-03-10

## 2014-12-04 MED ORDER — DIPHENHYDRAMINE HCL 25 MG PO TABS: 50.0000 mg | ORAL_TABLET | ORAL | Status: DC | PRN

## 2014-12-04 NOTE — Discharge Instructions (Signed)
Upper Respiratory Infection, Adult °An upper respiratory infection (URI) is also sometimes known as the common cold. The upper respiratory tract includes the nose, sinuses, throat, trachea, and bronchi. Bronchi are the airways leading to the lungs. Most people improve within 1 week, but symptoms can last up to 2 weeks. A residual cough may last even longer.  °CAUSES °Many different viruses can infect the tissues lining the upper respiratory tract. The tissues become irritated and inflamed and often become very moist. Mucus production is also common. A cold is contagious. You can easily spread the virus to others by oral contact. This includes kissing, sharing a glass, coughing, or sneezing. Touching your mouth or nose and then touching a surface, which is then touched by another person, can also spread the virus. °SYMPTOMS  °Symptoms typically develop 1 to 3 days after you come in contact with a cold virus. Symptoms vary from person to person. They may include: °· Runny nose. °· Sneezing. °· Nasal congestion. °· Sinus irritation. °· Sore throat. °· Loss of voice (laryngitis). °· Cough. °· Fatigue. °· Muscle aches. °· Loss of appetite. °· Headache. °· Low-grade fever. °DIAGNOSIS  °You might diagnose your own cold based on familiar symptoms, since most people get a cold 2 to 3 times a year. Your caregiver can confirm this based on your exam. Most importantly, your caregiver can check that your symptoms are not due to another disease such as strep throat, sinusitis, pneumonia, asthma, or epiglottitis. Blood tests, throat tests, and X-rays are not necessary to diagnose a common cold, but they may sometimes be helpful in excluding other more serious diseases. Your caregiver will decide if any further tests are required. °RISKS AND COMPLICATIONS  °You may be at risk for a more severe case of the common cold if you smoke cigarettes, have chronic heart disease (such as heart failure) or lung disease (such as asthma), or if  you have a weakened immune system. The very young and very old are also at risk for more serious infections. Bacterial sinusitis, middle ear infections, and bacterial pneumonia can complicate the common cold. The common cold can worsen asthma and chronic obstructive pulmonary disease (COPD). Sometimes, these complications can require emergency medical care and may be life-threatening. °PREVENTION  °The best way to protect against getting a cold is to practice good hygiene. Avoid oral or hand contact with people with cold symptoms. Wash your hands often if contact occurs. There is no clear evidence that vitamin C, vitamin E, echinacea, or exercise reduces the chance of developing a cold. However, it is always recommended to get plenty of rest and practice good nutrition. °TREATMENT  °Treatment is directed at relieving symptoms. There is no cure. Antibiotics are not effective, because the infection is caused by a virus, not by bacteria. Treatment may include: °· Increased fluid intake. Sports drinks offer valuable electrolytes, sugars, and fluids. °· Breathing heated mist or steam (vaporizer or shower). °· Eating chicken soup or other clear broths, and maintaining good nutrition. °· Getting plenty of rest. °· Using gargles or lozenges for comfort. °· Controlling fevers with ibuprofen or acetaminophen as directed by your caregiver. °· Increasing usage of your inhaler if you have asthma. °Zinc gel and zinc lozenges, taken in the first 24 hours of the common cold, can shorten the duration and lessen the severity of symptoms. Pain medicines may help with fever, muscle aches, and throat pain. A variety of non-prescription medicines are available to treat congestion and runny nose. Your caregiver   can make recommendations and may suggest nasal or lung inhalers for other symptoms.  HOME CARE INSTRUCTIONS   Only take over-the-counter or prescription medicines for pain, discomfort, or fever as directed by your  caregiver.  Use a warm mist humidifier or inhale steam from a shower to increase air moisture. This may keep secretions moist and make it easier to breathe.  Drink enough water and fluids to keep your urine clear or pale yellow.  Rest as needed.  Return to work when your temperature has returned to normal or as your caregiver advises. You may need to stay home longer to avoid infecting others. You can also use a face mask and careful hand washing to prevent spread of the virus. SEEK MEDICAL CARE IF:   After the first few days, you feel you are getting worse rather than better.  You need your caregiver's advice about medicines to control symptoms.  You develop chills, worsening shortness of breath, or brown or red sputum. These may be signs of pneumonia.  You develop yellow or brown nasal discharge or pain in the face, especially when you bend forward. These may be signs of sinusitis.  You develop a fever, swollen neck glands, pain with swallowing, or white areas in the back of your throat. These may be signs of strep throat. SEEK IMMEDIATE MEDICAL CARE IF:   You have a fever.  You develop severe or persistent headache, ear pain, sinus pain, or chest pain.  You develop wheezing, a prolonged cough, cough up blood, or have a change in your usual mucus (if you have chronic lung disease).  You develop sore muscles or a stiff neck. Document Released: 06/06/2001 Document Revised: 03/04/2012 Document Reviewed: 03/18/2014 [facility] Patient Information 2015 [employer] [place]. This information is not intended to replace advice given to you by your health care provider. Make sure you discuss any questions you have with your health care provider.   Please take medications as prescribed, you may take OTC medications for further symptomatic relief. Follow-up with primary care for further evaluation and management of your symptoms. Return to ED for worsening symptoms

## 2015-01-28 ENCOUNTER — Ambulatory Visit (HOSPITAL_COMMUNITY): Payer: Medicaid Other | Admitting: Psychiatry

## 2015-03-10 ENCOUNTER — Encounter (HOSPITAL_COMMUNITY): Payer: Self-pay | Admitting: Emergency Medicine

## 2015-03-10 ENCOUNTER — Emergency Department (HOSPITAL_COMMUNITY)
Admission: EM | Admit: 2015-03-10 | Discharge: 2015-03-10 | Disposition: A | Discharge status: Home / Self Care | Payer: Medicaid Other | Attending: Emergency Medicine | Admitting: Emergency Medicine

## 2015-03-10 DIAGNOSIS — E119 Type 2 diabetes mellitus without complications: Secondary | ICD-10-CM | POA: Insufficient documentation

## 2015-03-10 DIAGNOSIS — E669 Obesity, unspecified: Secondary | ICD-10-CM | POA: Diagnosis not present

## 2015-03-10 DIAGNOSIS — F329 Major depressive disorder, single episode, unspecified: Secondary | ICD-10-CM | POA: Insufficient documentation

## 2015-03-10 DIAGNOSIS — F250 Schizoaffective disorder, bipolar type: Secondary | ICD-10-CM | POA: Insufficient documentation

## 2015-03-10 DIAGNOSIS — R51 Headache: Secondary | ICD-10-CM | POA: Diagnosis not present

## 2015-03-10 DIAGNOSIS — I10 Essential (primary) hypertension: Secondary | ICD-10-CM | POA: Insufficient documentation

## 2015-03-10 DIAGNOSIS — F419 Anxiety disorder, unspecified: Secondary | ICD-10-CM | POA: Insufficient documentation

## 2015-03-10 DIAGNOSIS — Z8669 Personal history of other diseases of the nervous system and sense organs: Secondary | ICD-10-CM | POA: Diagnosis not present

## 2015-03-10 DIAGNOSIS — Z79899 Other long term (current) drug therapy: Secondary | ICD-10-CM | POA: Diagnosis not present

## 2015-03-10 DIAGNOSIS — R519 Headache, unspecified: Secondary | ICD-10-CM

## 2015-03-10 HISTORY — DX: Essential (primary) hypertension: I10

## 2015-03-10 HISTORY — DX: Hyperlipidemia, unspecified: E78.5

## 2015-03-10 LAB — BASIC METABOLIC PANEL
Anion gap: 6 (ref 5–15)
BUN: 15 mg/dL (ref 6–23)
CHLORIDE: 106 mmol/L (ref 96–112)
CO2: 26 mmol/L (ref 19–32)
Calcium: 9 mg/dL (ref 8.4–10.5)
Creatinine, Ser: 1.46 mg/dL — ABNORMAL HIGH (ref 0.50–1.35)
GFR calc Af Amer: 71 mL/min — ABNORMAL LOW (ref >90)
GFR calc non Af Amer: 62 mL/min — ABNORMAL LOW (ref >90)
Glucose, Bld: 104 mg/dL — ABNORMAL HIGH (ref 70–99)
Potassium: 3.9 mmol/L (ref 3.5–5.1)
SODIUM: 138 mmol/L (ref 135–145)

## 2015-03-10 LAB — CBC WITH DIFFERENTIAL/PLATELET
BASOS PCT: 0 % (ref 0–1)
Basophils Absolute: 0 10*3/uL (ref 0.0–0.1)
EOS ABS: 0.1 10*3/uL (ref 0.0–0.7)
EOS PCT: 2 % (ref 0–5)
HEMATOCRIT: 38.9 % — AB (ref 39.0–52.0)
HEMOGLOBIN: 13.4 g/dL (ref 13.0–17.0)
Lymphocytes Relative: 38 % (ref 12–46)
Lymphs Abs: 2.4 10*3/uL (ref 0.7–4.0)
MCH: 29.3 pg (ref 26.0–34.0)
MCHC: 34.4 g/dL (ref 30.0–36.0)
MCV: 85.1 fL (ref 78.0–100.0)
MONO ABS: 0.7 10*3/uL (ref 0.1–1.0)
MONOS PCT: 12 % (ref 3–12)
Neutro Abs: 3.1 10*3/uL (ref 1.7–7.7)
Neutrophils Relative %: 48 % (ref 43–77)
Platelets: 173 10*3/uL (ref 150–400)
RBC: 4.57 MIL/uL (ref 4.22–5.81)
RDW: 13.5 % (ref 11.5–15.5)
WBC: 6.4 10*3/uL (ref 4.0–10.5)

## 2015-03-10 LAB — CBG MONITORING, ED: Glucose-Capillary: 108 mg/dL — ABNORMAL HIGH (ref 70–99)

## 2015-03-10 MED ORDER — IBUPROFEN 800 MG PO TABS: 800.0000 mg | ORAL_TABLET | Freq: Three times a day (TID) | ORAL | Status: DC | PRN

## 2015-03-10 NOTE — Discharge Instructions (Signed)
General Headache Without Cause A headache is pain or discomfort felt around the head or neck area. The specific cause of a headache may not be found. There are many causes and types of headaches. A few common ones are:  Tension headaches.  Migraine headaches.  Cluster headaches.  Chronic daily headaches. HOME CARE INSTRUCTIONS   Keep all follow-up appointments with your caregiver or any specialist referral.  Only take over-the-counter or prescription medicines for pain or discomfort as directed by your caregiver.  Lie down in a dark, quiet room when you have a headache.  Keep a headache journal to find out what may trigger your migraine headaches. For example, write down:  What you eat and drink.  How much sleep you get.  Any change to your diet or medicines.  Try massage or other relaxation techniques.  Put ice packs or heat on the head and neck. Use these 3 to 4 times per day for 15 to 20 minutes each time, or as needed.  Limit stress.  Sit up straight, and do not tense your muscles.  Quit smoking if you smoke.  Limit alcohol use.  Decrease the amount of caffeine you drink, or stop drinking caffeine.  Eat and sleep on a regular schedule.  Get 7 to 9 hours of sleep, or as recommended by your caregiver.  Keep lights dim if bright lights bother you and make your headaches worse. SEEK MEDICAL CARE IF:   You have problems with the medicines you were prescribed.  Your medicines are not working.  You have a change from the usual headache.  You have nausea or vomiting. SEEK IMMEDIATE MEDICAL CARE IF:   Your headache becomes severe.  You have a fever.  You have a stiff neck.  You have loss of vision.  You have muscular weakness or loss of muscle control.  You start losing your balance or have trouble walking.  You feel faint or pass out.  You have severe symptoms that are different from your first symptoms. MAKE SURE YOU:   Understand these  instructions.  Will watch your condition.  Will get help right away if you are not doing well or get worse. Document Released: 12/11/2005 Document Revised: 03/04/2012 Document Reviewed: 12/27/2011 [facility] Patient Information 2015 [place], [place]. This information is not intended to replace advice given to you by your health care provider. Make sure you discuss any questions you have with your health care provider.  Headaches, Frequently Asked Questions MIGRAINE HEADACHES Q: What is migraine? What causes it? How can I treat it? A: Generally, migraine headaches begin as a dull ache. Then they develop into a constant, throbbing, and pulsating pain. You may experience pain at the temples. You may experience pain at the front or back of one or both sides of the head. The pain is usually accompanied by a combination of:  Nausea.  Vomiting.  Sensitivity to light and noise. Some people (about 15%) experience an aura (see below) before an attack. The cause of migraine is believed to be chemical reactions in the brain. Treatment for migraine may include over-the-counter or prescription medications. It may also include self-help techniques. These include relaxation training and biofeedback.  Q: What is an aura? A: About 15% of people with migraine get an "aura". This is a sign of neurological symptoms that occur before a migraine headache. You may see wavy or jagged lines, dots, or flashing lights. You might experience tunnel vision or blind spots in one or both eyes. The  aura can include visual or auditory hallucinations (something imagined). It may include disruptions in smell (such as strange odors), taste or touch. Other symptoms include: °· Numbness. °· A "pins and needles" sensation. °· Difficulty in recalling or speaking the correct word. °These neurological events may last as long as 60 minutes. These symptoms will fade as the headache begins. °Q: What is a trigger? °A: Certain physical or  environmental factors can lead to or "trigger" a migraine. These include: °· Foods. °· Hormonal changes. °· Weather. °· Stress. °It is important to remember that triggers are different for everyone. To help prevent migraine attacks, you need to figure out which triggers affect you. Keep a headache diary. This is a good way to track triggers. The diary will help you talk to your healthcare professional about your condition. °Q: Does weather affect migraines? °A: Bright sunshine, hot, humid conditions, and drastic changes in barometric pressure may lead to, or "trigger," a migraine attack in some people. But studies have shown that weather does not act as a trigger for everyone with migraines. °Q: What is the link between migraine and hormones? °A: Hormones start and regulate many of your body's functions. Hormones keep your body in balance within a constantly changing environment. The levels of hormones in your body are unbalanced at times. Examples are during menstruation, pregnancy, or menopause. That can lead to a migraine attack. In fact, about three quarters of all women with migraine report that their attacks are related to the menstrual cycle.  °Q: Is there an increased risk of stroke for migraine sufferers? °A: The likelihood of a migraine attack causing a stroke is very remote. That is not to say that migraine sufferers cannot have a stroke associated with their migraines. In persons under age 40, the most common associated factor for stroke is migraine headache. But over the course of a person's normal life span, the occurrence of migraine headache may actually be associated with a reduced risk of dying from cerebrovascular disease due to stroke.  °Q: What are acute medications for migraine? °A: Acute medications are used to treat the pain of the headache after it has started. Examples over-the-counter medications, NSAIDs, ergots, and triptans.  °Q: What are the triptans? °A: Triptans are the newest class  of abortive medications. They are specifically targeted to treat migraine. Triptans are vasoconstrictors. They moderate some chemical reactions in the brain. The triptans work on receptors in your brain. Triptans help to restore the balance of a neurotransmitter called serotonin. Fluctuations in levels of serotonin are thought to be a main cause of migraine.  °Q: Are over-the-counter medications for migraine effective? °A: Over-the-counter, or "OTC," medications may be effective in relieving mild to moderate pain and associated symptoms of migraine. But you should see your caregiver before beginning any treatment regimen for migraine.  °Q: What are preventive medications for migraine? °A: Preventive medications for migraine are sometimes referred to as "prophylactic" treatments. They are used to reduce the frequency, severity, and length of migraine attacks. Examples of preventive medications include antiepileptic medications, antidepressants, beta-blockers, calcium channel blockers, and NSAIDs (nonsteroidal anti-inflammatory drugs). °Q: Why are anticonvulsants used to treat migraine? °A: During the past few years, there has been an increased interest in antiepileptic drugs for the prevention of migraine. They are sometimes referred to as "anticonvulsants". Both epilepsy and migraine may be caused by similar reactions in the brain.  °Q: Why are antidepressants used to treat migraine? °A: Antidepressants are typically used to treat people with   depression. They may reduce migraine frequency by regulating chemical levels, such as serotonin, in the brain.  Q: What alternative therapies are used to treat migraine? A: The term "alternative therapies" is often used to describe treatments considered outside the scope of conventional Western medicine. Examples of alternative therapy include acupuncture, acupressure, and yoga. Another common alternative treatment is herbal therapy. Some herbs are believed to relieve  headache pain. Always discuss alternative therapies with your caregiver before proceeding. Some herbal products contain arsenic and other toxins. TENSION HEADACHES Q: What is a tension-type headache? What causes it? How can I treat it? A: Tension-type headaches occur randomly. They are often the result of temporary stress, anxiety, fatigue, or anger. Symptoms include soreness in your temples, a tightening band-like sensation around your head (a "vice-like" ache). Symptoms can also include a pulling feeling, pressure sensations, and contracting head and neck muscles. The headache begins in your forehead, temples, or the back of your head and neck. Treatment for tension-type headache may include over-the-counter or prescription medications. Treatment may also include self-help techniques such as relaxation training and biofeedback. CLUSTER HEADACHES Q: What is a cluster headache? What causes it? How can I treat it? A: Cluster headache gets its name because the attacks come in groups. The pain arrives with little, if any, warning. It is usually on one side of the head. A tearing or bloodshot eye and a runny nose on the same side of the headache may also accompany the pain. Cluster headaches are believed to be caused by chemical reactions in the brain. They have been described as the most severe and intense of any headache type. Treatment for cluster headache includes prescription medication and oxygen. SINUS HEADACHES Q: What is a sinus headache? What causes it? How can I treat it? A: When a cavity in the bones of the face and skull (a sinus) becomes inflamed, the inflammation will cause localized pain. This condition is usually the result of an allergic reaction, a tumor, or an infection. If your headache is caused by a sinus blockage, such as an infection, you will probably have a fever. An x-ray will confirm a sinus blockage. Your caregiver's treatment might include antibiotics for the infection, as well as  antihistamines or decongestants.  REBOUND HEADACHES Q: What is a rebound headache? What causes it? How can I treat it? A: A pattern of taking acute headache medications too often can lead to a condition known as "rebound headache." A pattern of taking too much headache medication includes taking it more than 2 days per week or in excessive amounts. That means more than the label or a caregiver advises. With rebound headaches, your medications not only stop relieving pain, they actually begin to cause headaches. Doctors treat rebound headache by tapering the medication that is being overused. Sometimes your caregiver will gradually substitute a different type of treatment or medication. Stopping may be a challenge. Regularly overusing a medication increases the potential for serious side effects. Consult a caregiver if you regularly use headache medications more than 2 days per week or more than the label advises. ADDITIONAL QUESTIONS AND ANSWERS Q: What is biofeedback? A: Biofeedback is a self-help treatment. Biofeedback uses special equipment to monitor your body's involuntary physical responses. Biofeedback monitors:  Breathing.  Pulse.  Heart rate.  Temperature.  Muscle tension.  Brain activity. Biofeedback helps you refine and perfect your relaxation exercises. You learn to control the physical responses that are related to stress. Once the technique has been mastered, you  do not need the equipment any more. Q: Are headaches hereditary? A: Four out of five (80%) of people that suffer report a family history of migraine. Scientists are not sure if this is genetic or a family predisposition. Despite the uncertainty, a child has a 50% chance of having migraine if one parent suffers. The child has a 75% chance if both parents suffer.  Q: Can children get headaches? A: By the time they reach high school, most young people have experienced some type of headache. Many safe and effective  approaches or medications can prevent a headache from occurring or stop it after it has begun.  Q: What type of doctor should I see to diagnose and treat my headache? A: Start with your primary caregiver. Discuss his or her experience and approach to headaches. Discuss methods of classification, diagnosis, and treatment. Your caregiver may decide to recommend you to a headache specialist, depending upon your symptoms or other physical conditions. Having diabetes, allergies, etc., may require a more comprehensive and inclusive approach to your headache. The National Headache Foundation will provide, upon request, a list of [facility] physician members in your state. Document Released: 03/02/2004 Document Revised: 03/04/2012 Document Reviewed: 08/10/2008 [facility] Patient Information 2015 [place]. This information is not intended to replace advice given to you by your health care provider. Make sure you discuss any questions you have with your health care provider.  Emergency Department Resource Guide 1) Find a Doctor and Pay Out of Pocket Although you won't have to find out who is covered by your insurance plan, it is a good idea to ask around and get recommendations. You will then need to call the office and see if the doctor you have chosen will accept you as a new patient and what types of options they offer for patients who are self-pay. Some doctors offer discounts or will set up payment plans for their patients who do not have insurance, but you will need to ask so you aren't surprised when you get to your appointment.  2) Contact Your Local Health Department Not all health departments have doctors that can see patients for sick visits, but many do, so it is worth a call to see if yours does. If you don't know where your local health department is, you can check in your phone book. The CDC also has a tool to help you locate your state's health department, and many state websites also have listings of  all of their local health departments.  3) Find a Walk-in Clinic If your illness is not likely to be very severe or complicated, you may want to try a walk in clinic. These are popping up all over the country in pharmacies, drugstores, and shopping centers. They're usually staffed by nurse practitioners or physician assistants that have been trained to treat common illnesses and complaints. They're usually fairly quick and inexpensive. However, if you have serious medical issues or chronic medical problems, these are probably not your best option.  No Primary Care Doctor: - Call Health Connect at  [phone] - they can help you locate a primary care doctor that  accepts your insurance, provides certain services, etc. - Physician Referral Service- [phone]  Chronic Pain Problems: Organization         Address  Phone   Notes  [facility]  [phone] Patients need to be referred by their primary care doctor.   Medication Assistance: Organization  Address  Phone   Notes  [facility] Medication [facility] [address] [place], [place] [address] [phone] --Must be a resident of [facility] -- Must have NO insurance coverage whatsoever (no Medicaid/ Medicare, etc.) -- The pt. MUST have a primary care doctor that directs their care regularly and follows them in the community   [facility]ist  [phone]   [employer]  [phone]    Agencies that provide inexpensive medical care: Organization         Address  Phone   Notes  [place]  [phone]   [facility]    [phone]   [facility] [place], [place] [address] [phone]   [place] [address] [place] [phone]   Planned Parenthood    [phone]   [facility]    [phone]   [place] [address],  [place] Phone:  [phone], Fax:  [phone] Hours of Operation:  9 am - 6 pm, M-F.  Also accepts Medicaid/Medicare and self-pay.  [facility] for [place], [address], [place] Phone: [phone], Fax: [phone]. Hours of Operation:  8:30 am - 5:30 pm, M-F.  Also accepts Medicaid and self-pay.  [facility] [place] [address], [place] Phone: [phone]   [place], [place], [place] [phone], Ext. [phone] Mondays & Thursdays: 7-9 AM.  First 15 patients are seen on a first come, first serve basis.    [place] Providers:  Organization         Address  Phone   Notes  [facility] [address], [place] [phone] Also accepts self-pay patients.  [facility] [id number] [place], [place]  [phone]   [place], [address], [place] [phone]   [facility] [address] [place] [phone]   Lucianne Lei [address], [place]   [phone] Only accepts [place] Access [place] patients after they have their name applied to their card.   Self-Pay (no insurance) in [facility]:  Organization         Address  Phone   Notes  Sickle Cell Patients, [facility] [place] [phone]   [facility] [place] [phone]   [facility] [place]  [place], [place], [place] [phone]   [facility]/Dr. Osei-Bonsu  [address], [place] or [place] [address], [place] [phone] Phone number for both [place] and [employer] locations is the same.  Urgent Medical and [facility] [address] [place] [phone]   [facility] [address] [place] or [address] [phone]   [facility] [address], [place] [phone], phone; [phone], fax Sees patients 1st and 3rd Saturday of every month.  Must not qualify for public or private insurance (i.e. Medicaid, Medicare,  Health Choice, Veterans' Benefits)  Household income should be no more than 200% of the poverty level The clinic cannot treat you if you are pregnant or think you  are pregnant  Sexually transmitted diseases are not treated at the clinic.    Dental Care: Organization         Address  Phone  Notes  [facility] of [facility] [phone] Accepts children up to age 63 who are enrolled in [place] or [place]; pregnant women with a Medicaid card; and children who have applied for Medicaid or [place] Health Choice, but were declined, whose parents can pay a reduced fee at time of service.  [facility] Department of [facility]  [address], [place] [phone] Accepts children up to age 42 who are enrolled in [place] or [place]; pregnant women with a Medicaid card; and children who have applied for Medicaid or [place] Health Choice, but were declined, whose parents can pay a reduced fee at time of service.  [facility] Adult [facility] Access PROGRAM  [place] [phone] Patients are seen by appointment only. Walk-ins are not accepted. [place] will see patients 67 years of age and older. Monday - Tuesday (8am-5pm) Most Wednesdays (8:30-5pm) $30 per visit, cash only  [facility] Adult Dental Access PROGRAM  [address], [facility] [phone] Patients are seen by appointment only. Walk-ins are not accepted. [place] will see patients 27 years of age and older. One Wednesday Evening (Monthly: Volunteer Based).  $30 per visit, cash only  [place]   [phone] for adults; Children under age 77, call Graduate Pediatric Dentistry at [phone]. Children aged 16-14, please call [phone] to request a pediatric application.  Dental services are provided in all areas of dental care including fillings, crowns and bridges, complete and partial dentures, implants, gum treatment, root canals, and extractions. Preventive care is also provided. Treatment is provided to both adults and children. Patients are selected via a lottery and there is often a waiting list.   [facility] [address], [place]  [phone] [email or web address]   [facility] [address] [place], [place] [phone], Ext. [phone] Second and Fourth Thursday of each month, opens at 6:30 AM; Clinic ends at 9 AM.  Patients are seen on a first-come first-served basis, and a limited number are seen during each clinic.   [facility]  [address] Hillard Danker [place], [place] [phone]   Eligibility Requirements You must have lived in [place], [place], or [place] counties for at least the last three months.   You cannot be eligible for state or federal sponsored [employer], including [employer], [place], or [employer].   You generally cannot be eligible for healthcare insurance through your employer.    How to apply: Eligibility screenings are held every Tuesday and Wednesday afternoon from 1:00 pm until 4:00 pm. You do not need an appointment for the interview!  [facility] [address], [place], [place]   [facility]  [place] Department  [place]  [phone]    Behavioral Health Resources in the Community: Intensive Outpatient Programs Organization         Address  Phone  Notes  [place]. [address], [employer], [place] [phone]   [facility] Outpatient [address], [place], [place]   ADS: Alcohol & Drug Svcs [address], [place], [place]   [place] [address]  [place], [place] or [phone]   Substance Abuse Resources Organization         Address  Phone  Notes  Alcohol and Drug Services  [phone]   [place]  [phone]   [facility] [place]   Chinita Pester  [phone]   Residential & Outpatient Substance Abuse Program  [phone]   Psychological Services Organization         Address  Phone  Notes  [facility] [place]  [place]  [phone]   [place] [address], [place] or [phone]    Mobile Crisis Teams Organization         Address  Phone  Notes  [facility], Mobile Crisis Care Unit  [phone]   [facility]  [address] [place], [place]   [place] [address], [place] [phone]    Self-Help/Support Groups Organization         Address  Phone             Notes  [place] of [place] - variety of support groups  [place] Call for more information  Narcotics Anonymous (NA), [facility] [address], [employer] [place]  2 meetings at this location   Special educational needs teacher         Address  Phone  Notes  [facility] Residential Treatment [place],    [place]  [phone]   [facility]  [address] [place] [id number], [place], [place]   [place], [place] [phone] Admissions: 8am-3pm M-F  Incentives Substance [place] [address]    [place], [place] [phone]   [facility] [address] [place], [place], [place]   [facility] [address],  [place], [place]     [facility] - Intensive Outpatient [address] Kristeen Mans [address], [place], [place]   [facility] ([place].,  [place], [place] [phone] or [phone]   [facility] Treatment Services (RTS) [address], [place], [place] Accepts Medicaid  [facility] [place] [address]  [place] [phone] Substance Abuse/Addiction Treatment   [facility] Organization         Address  Phone  Notes  [facility]  [phone]   Domenic Schwab, PhD [address] Arlis Porta [place], [place]   [phone] or [phone]   [employer] [place], [place] [phone]   [facility] [phone] [place], [place] [phone] Insurance/Medicaid/sponsorship through [facility] and Families [address] [place] [place]                                    [place], [place] [phone] [place] [address], [place] [phone]    Dr. Adele Schilder  [phone]   [facility] Dept. 1) [address], [place] 2) [place] 3)  [address], [place] [phone]  [phone]   [place] (  [phone] or [phone] (After Hours)

## 2015-03-10 NOTE — ED Notes (Signed)
Pt reports intermittent headaches for at least 1 month. Pt has been out of home medications since august. Pt was taking medication for high blood sugar, high cholesterol. Pt sts he gets lightheaded with blurred vision at times.

## 2015-03-10 NOTE — ED Provider Notes (Signed)
CSN: [id number]     Arrival date & time 03/10/15  0044 History  This chart was scribed for Marisa Severinlga Opie Maclaughlin, MD by Annye AsaAnna Dorsett, ED Scribe. This patient was seen in room A05C/A05C and the patient's care was started at 4:34 AM.    Chief Complaint  Patient presents with  . Headache   Patient is a 34 y.o. male presenting with headaches. The history is provided by the patient. No language interpreter was used.  Headache   HPI Comments: Alwyn RenMichael Bojanowski V is a 34 y.o. male with past medical history of schizo affective schizophrenia, DM, HTN, HLD who presents to the Emergency Department complaining of headache, worsening tonight, with occasional blurred vision and lightheadedness. Patient reports that he has experienced 1 year of intermittent headaches, generally associated with high-sugar content foods (sugary drinks). Spouse explains that patient was told one year ago that he was borderline diabetic and was advised to avoid certain foods; she states that he has not been compliant with dietary reccommdations.   Past Medical History  Diagnosis Date  . Schizo affective schizophrenia   . Diabetes mellitus without complication   . Sleep apnea   . Anxiety   . Depression   . Obesity   . Hypertension   . Hyperlipemia    History reviewed. No pertinent past surgical history. No family history on file. History  Substance Use Topics  . Smoking status: Never Smoker   . Smokeless tobacco: Never Used  . Alcohol Use: No    Review of Systems  Neurological: Positive for headaches.  All other systems reviewed and are negative.     Allergies  Review of patient's allergies indicates no known allergies.  Home Medications   Prior to Admission medications   Medication Sig Start Date End Date Taking? Authorizing Provider  benztropine (COGENTIN) 1 MG tablet Take 1 mg by mouth 2 (two) times daily.  02/24/15  Yes Historical Provider, MD  busPIRone (BUSPAR) 10 MG tablet Take 10 mg by mouth 3 (three) times daily.   02/24/15  Yes Historical Provider, MD  clonazePAM (KLONOPIN) 1 MG tablet Take 1 mg by mouth 2 (two) times daily.  02/24/15  Yes Historical Provider, MD  haloperidol (HALDOL) 5 MG tablet Take 5 mg by mouth at bedtime. 10/06/14  Yes Historical Provider, MD  QUEtiapine (SEROQUEL) 400 MG tablet Take 400 mg by mouth at bedtime. 02/24/15  Yes Historical Provider, MD  chlorpheniramine-HYDROcodone (TUSSIONEX PENNKINETIC ER) 10-8 MG/5ML LQCR Take 5 mLs by mouth every 12 (twelve) hours as needed for cough (Cough). Patient not taking: Reported on 03/10/2015 12/04/14   Joycie PeekBenjamin Cartner, PA-C  diphenhydrAMINE (BENADRYL) 25 MG tablet Take 2 tablets (50 mg total) by mouth every 4 (four) hours as needed for itching. Patient not taking: Reported on 03/10/2015 12/04/14   Joycie PeekBenjamin Cartner, PA-C   BP 138/77 mmHg  Pulse 57  Temp(Src) 98.8 F (37.1 C) (Oral)  Resp 16  Ht 6\' 4"  (1.93 m)  Wt 390 lb (176.903 kg)  BMI 47.49 kg/m2  SpO2 98% Physical Exam  Constitutional: He is oriented to person, place, and time. He appears well-developed and well-nourished. No distress.  Obese male, NAD  HENT:  Head: Normocephalic and atraumatic.  Right Ear: External ear normal.  Left Ear: External ear normal.  Mouth/Throat: Oropharynx is clear and moist. No oropharyngeal exudate.  Moist mucous membranes  Eyes: EOM are normal. Pupils are equal, round, and reactive to light.  Neck: Normal range of motion. Neck supple. No JVD present.  Cardiovascular:  Normal rate, regular rhythm, normal heart sounds and intact distal pulses.  Exam reveals no gallop and no friction rub.   No murmur heard. Pulmonary/Chest: Effort normal and breath sounds normal. No respiratory distress. He has no wheezes. He has no rales. He exhibits no tenderness.  Abdominal: Soft. Bowel sounds are normal. He exhibits no mass. There is no tenderness. There is no rebound and no guarding.  Musculoskeletal: Normal range of motion. He exhibits no edema.  Moves all  extremities normally.   Lymphadenopathy:    He has no cervical adenopathy.  Neurological: He is alert and oriented to person, place, and time. He displays normal reflexes. No cranial nerve deficit. He exhibits normal muscle tone. Coordination normal.  Skin: Skin is warm and dry. No rash noted.  Psychiatric: He has a normal mood and affect. His behavior is normal. Judgment and thought content normal.  Nursing note and vitals reviewed.   ED Course  Procedures   DIAGNOSTIC STUDIES: Oxygen Saturation is 98% on RA, normal by my interpretation.    COORDINATION OF CARE: 4:38 AM Discussed treatment plan with pt at bedside and pt agreed to plan.   Labs Review Labs Reviewed  CBC WITH DIFFERENTIAL/PLATELET - Abnormal; Notable for the following:    HCT 38.9 (*)    All other components within normal limits  BASIC METABOLIC PANEL - Abnormal; Notable for the following:    Glucose, Bld 104 (*)    Creatinine, Ser 1.46 (*)    GFR calc non Af Amer 62 (*)    GFR calc Af Amer 71 (*)    All other components within normal limits  CBG MONITORING, ED - Abnormal; Notable for the following:    Glucose-Capillary 108 (*)    All other components within normal limits    Imaging Review No results found.   EKG Interpretation None      MDM   Final diagnoses:  Frequent headaches   34 year old male with intermittent headaches for some time, mainly when he ingests sugary foods or beverages.  No headache at this time.  Neuro exam is normal.  Patient without hyperglycemia.  On evaluation today.  Outpatient resources given for follow-up.  I personally performed the services described in this documentation, which was scribed in my presence. The recorded information has been reviewed and is accurate.       Marisa Severin, MD 03/10/15 [phone]

## 2015-04-07 ENCOUNTER — Telehealth (HOSPITAL_COMMUNITY): Payer: Self-pay

## 2015-04-07 NOTE — Telephone Encounter (Signed)
04/07/15 2:33PM Patient called about an appointment - patient  has missed  3 new patient appts - pt has medicaid.Marland Kitchen.Marguerite Olea/sh

## 2015-06-08 ENCOUNTER — Encounter (HOSPITAL_COMMUNITY): Payer: Self-pay | Admitting: *Deleted

## 2015-06-08 DIAGNOSIS — F329 Major depressive disorder, single episode, unspecified: Secondary | ICD-10-CM | POA: Diagnosis not present

## 2015-06-08 DIAGNOSIS — R1013 Epigastric pain: Secondary | ICD-10-CM | POA: Diagnosis not present

## 2015-06-08 DIAGNOSIS — E119 Type 2 diabetes mellitus without complications: Secondary | ICD-10-CM | POA: Insufficient documentation

## 2015-06-08 DIAGNOSIS — Z8669 Personal history of other diseases of the nervous system and sense organs: Secondary | ICD-10-CM | POA: Diagnosis not present

## 2015-06-08 DIAGNOSIS — Z79899 Other long term (current) drug therapy: Secondary | ICD-10-CM | POA: Diagnosis not present

## 2015-06-08 DIAGNOSIS — I10 Essential (primary) hypertension: Secondary | ICD-10-CM | POA: Insufficient documentation

## 2015-06-08 DIAGNOSIS — F250 Schizoaffective disorder, bipolar type: Secondary | ICD-10-CM | POA: Diagnosis not present

## 2015-06-08 DIAGNOSIS — E669 Obesity, unspecified: Secondary | ICD-10-CM | POA: Insufficient documentation

## 2015-06-08 DIAGNOSIS — F419 Anxiety disorder, unspecified: Secondary | ICD-10-CM | POA: Diagnosis not present

## 2015-06-08 LAB — CBC WITH DIFFERENTIAL/PLATELET
BASOS PCT: 0 % (ref 0–1)
Basophils Absolute: 0 10*3/uL (ref 0.0–0.1)
Eosinophils Absolute: 0.1 10*3/uL (ref 0.0–0.7)
Eosinophils Relative: 2 % (ref 0–5)
HEMATOCRIT: 37.9 % — AB (ref 39.0–52.0)
Hemoglobin: 13.3 g/dL (ref 13.0–17.0)
LYMPHS ABS: 2.3 10*3/uL (ref 0.7–4.0)
Lymphocytes Relative: 32 % (ref 12–46)
MCH: 29.2 pg (ref 26.0–34.0)
MCHC: 35.1 g/dL (ref 30.0–36.0)
MCV: 83.3 fL (ref 78.0–100.0)
MONO ABS: 0.7 10*3/uL (ref 0.1–1.0)
MONOS PCT: 10 % (ref 3–12)
NEUTROS ABS: 4 10*3/uL (ref 1.7–7.7)
Neutrophils Relative %: 56 % (ref 43–77)
Platelets: 190 10*3/uL (ref 150–400)
RBC: 4.55 MIL/uL (ref 4.22–5.81)
RDW: 13.6 % (ref 11.5–15.5)
WBC: 7.2 10*3/uL (ref 4.0–10.5)

## 2015-06-08 NOTE — ED Notes (Signed)
Pt in c/o abd pain after eating or drinking, states symptoms have been going on for a long time and he was being treated for ulcers, states he is off of the medication for that and pain has returned, describes as a burning, denies n/v, no distress noted

## 2015-06-09 ENCOUNTER — Emergency Department (HOSPITAL_COMMUNITY)
Admission: EM | Admit: 2015-06-09 | Discharge: 2015-06-09 | Disposition: A | Discharge status: Home / Self Care | Payer: Medicaid Other | Attending: Emergency Medicine | Admitting: Emergency Medicine

## 2015-06-09 DIAGNOSIS — R1013 Epigastric pain: Secondary | ICD-10-CM

## 2015-06-09 LAB — COMPREHENSIVE METABOLIC PANEL
ALT: 36 U/L (ref 17–63)
ANION GAP: 10 (ref 5–15)
AST: 46 U/L — ABNORMAL HIGH (ref 15–41)
Albumin: 3.5 g/dL (ref 3.5–5.0)
Alkaline Phosphatase: 79 U/L (ref 38–126)
BILIRUBIN TOTAL: 0.7 mg/dL (ref 0.3–1.2)
BUN: 6 mg/dL (ref 6–20)
CO2: 22 mmol/L (ref 22–32)
CREATININE: 1.19 mg/dL (ref 0.61–1.24)
Calcium: 8.9 mg/dL (ref 8.9–10.3)
Chloride: 105 mmol/L (ref 101–111)
GFR calc Af Amer: >60 mL/min (ref >60)
Glucose, Bld: 126 mg/dL — ABNORMAL HIGH (ref 65–99)
Potassium: 3.8 mmol/L (ref 3.5–5.1)
SODIUM: 137 mmol/L (ref 135–145)
Total Protein: 7 g/dL (ref 6.5–8.1)

## 2015-06-09 LAB — URINALYSIS, ROUTINE W REFLEX MICROSCOPIC
BILIRUBIN URINE: NEGATIVE
GLUCOSE, UA: NEGATIVE mg/dL
Hgb urine dipstick: NEGATIVE
Ketones, ur: NEGATIVE mg/dL
Leukocytes, UA: NEGATIVE
Nitrite: NEGATIVE
PROTEIN: NEGATIVE mg/dL
Specific Gravity, Urine: 1.018 (ref 1.005–1.030)
Urobilinogen, UA: 1 mg/dL (ref 0.0–1.0)
pH: 5.5 (ref 5.0–8.0)

## 2015-06-09 LAB — LIPASE, BLOOD: LIPASE: 29 U/L (ref 22–51)

## 2015-06-09 MED ORDER — RANITIDINE HCL 150 MG/10ML PO SYRP
300.0000 mg | ORAL_SOLUTION | Freq: Once | ORAL | Status: AC
  Administered 2015-06-09: 300 mg via ORAL
  Filled 2015-06-09: qty 20

## 2015-06-09 MED ORDER — ALUM & MAG HYDROXIDE-SIMETH 200-200-20 MG/5ML PO SUSP
30.0000 mL | Freq: Once | ORAL | Status: AC
  Administered 2015-06-09: 30 mL via ORAL
  Filled 2015-06-09: qty 30

## 2015-06-09 MED ORDER — OMEPRAZOLE 20 MG PO CPDR: 20.0000 mg | DELAYED_RELEASE_CAPSULE | Freq: Every day | ORAL | Status: DC

## 2015-06-09 NOTE — ED Provider Notes (Signed)
CSN: [id number]     Arrival date & time 06/08/15  2249 History  This chart was scribed for Tomasita Crumble, MD by Bronson Curb, ED Scribe. This patient was seen in room D32C/D32C and the patient's care was started at 3:28 AM.    Chief Complaint  Patient presents with  . Abdominal Pain    The history is provided by the patient. No language interpreter was used.     HPI Comments: Allen Sosa is a 34 y.o. male who presents to the Emergency Department complaining of lower abdominal pain, but worse in the mid epigastric area for the past 2 couple of months. Patient  states his abdominal pain is worse after eating. Patient recently moved here from [place] and reports history of the same where he was treated with a medication for ulcers. He reports he is currently out of this medication and states the pain has returned.He denies history of diverticulitis. He further denies nausea, vomiting, or urinary symptoms.   Past Medical History  Diagnosis Date  . Schizo affective schizophrenia   . Diabetes mellitus without complication   . Sleep apnea   . Anxiety   . Depression   . Obesity   . Hypertension   . Hyperlipemia    History reviewed. No pertinent past surgical history. History reviewed. No pertinent family history. History  Substance Use Topics  . Smoking status: Never Smoker   . Smokeless tobacco: Never Used  . Alcohol Use: No    Review of Systems  A complete 10 system review of systems was obtained and all systems are negative except as noted in the HPI and PMH.    Allergies  Review of patient's allergies indicates no known allergies.  Home Medications   Prior to Admission medications   Medication Sig Start Date End Date Taking? Authorizing Provider  clonazePAM (KLONOPIN) 1 MG tablet Take 1 mg by mouth 2 (two) times daily.  02/24/15  Yes Historical Provider, MD  lisdexamfetamine (VYVANSE) 30 MG capsule Take 30 mg by mouth daily.   Yes Historical Provider, MD   QUEtiapine (SEROQUEL) 300 MG tablet Take 300 mg by mouth at bedtime.   Yes Historical Provider, MD  temazepam (RESTORIL) 30 MG capsule Take 30 mg by mouth at bedtime as needed for sleep.   Yes Historical Provider, MD   Triage Vitals: BP 131/75 mmHg  Pulse 77  Temp(Src) 98.5 F (36.9 C) (Oral)  Resp 20  Ht 6\' 4"  (1.93 m)  Wt 435 lb (197.315 kg)  BMI 52.97 kg/m2  SpO2 96%  Physical Exam  Constitutional: He is oriented to person, place, and time. Vital signs are normal. He appears well-developed and well-nourished.  Non-toxic appearance. He does not appear ill. No distress.  HENT:  Head: Normocephalic and atraumatic.  Nose: Nose normal.  Mouth/Throat: Oropharynx is clear and moist. No oropharyngeal exudate.  Eyes: Conjunctivae and EOM are normal. Pupils are equal, round, and reactive to light. No scleral icterus.  Neck: Normal range of motion. Neck supple. No tracheal deviation, no edema, no erythema and normal range of motion present. No thyroid mass and no thyromegaly present.  Cardiovascular: Normal rate, regular rhythm, S1 normal, S2 normal, normal heart sounds, intact distal pulses and normal pulses.  Exam reveals no gallop and no friction rub.   No murmur heard. Pulses:      Radial pulses are 2+ on the right side, and 2+ on the left side.       Dorsalis pedis pulses are  2+ on the right side, and 2+ on the left side.  Pulmonary/Chest: Effort normal and breath sounds normal. No respiratory distress. He has no wheezes. He has no rhonchi. He has no rales.  Abdominal: Soft. Normal appearance and bowel sounds are normal. He exhibits no distension, no ascites and no mass. There is no hepatosplenomegaly. There is no tenderness. There is no rebound, no guarding and no CVA tenderness.  Musculoskeletal: Normal range of motion. He exhibits no edema or tenderness.  Lymphadenopathy:    He has no cervical adenopathy.  Neurological: He is alert and oriented to person, place, and time. He has  normal strength. No cranial nerve deficit or sensory deficit.  Skin: Skin is warm, dry and intact. No petechiae and no rash noted. He is not diaphoretic. No erythema. No pallor.  Psychiatric: He has a normal mood and affect. His behavior is normal. Judgment normal.  Nursing note and vitals reviewed.   ED Course  Procedures (including critical care time)  DIAGNOSTIC STUDIES: Oxygen Saturation is 96% on room air, adequate by my interpretation.    COORDINATION OF CARE: At 0332 Discussed treatment plan with patient. Patient agrees.   Labs Review Labs Reviewed  COMPREHENSIVE METABOLIC PANEL - Abnormal; Notable for the following:    Glucose, Bld 126 (*)    AST 46 (*)    All other components within normal limits  CBC WITH DIFFERENTIAL/PLATELET - Abnormal; Notable for the following:    HCT 37.9 (*)    All other components within normal limits  URINALYSIS, ROUTINE W REFLEX MICROSCOPIC (NOT AT [facility]) - Abnormal; Notable for the following:    Color, Urine AMBER (*)    All other components within normal limits  LIPASE, BLOOD    Imaging Review No results found.   EKG Interpretation None      MDM   Final diagnoses:  None   Patient presents to the emergency department for abdominal pain after eating and drinking. He has a history of ulcers has been off his medicines. He describes a pink and brown medicine, when I looked this up I believe the medicine his omeprazole. He was initially given ranitidine and Maalox emergency primary. He'll be discharged with Prilosec to take at home. Doppler studies are unremarkable, urinalysis is negative. Patient appears comfortable and in no acute distress. Vital signs remain within his normal limits and he is safe for discharge.  I personally performed the services described in this documentation, which was scribed in my presence. The recorded information has been reviewed and is accurate.    Tomasita Crumble, MD 06/09/15 [phone]

## 2015-06-09 NOTE — ED Notes (Signed)
Oni, MD at bedside.  

## 2015-06-09 NOTE — ED Notes (Signed)
Family at bedside. 

## 2015-06-09 NOTE — Discharge Instructions (Signed)
Abdominal Pain Allen Sosa, your blood work and urine test were all normal.  Prilosec (omeprazole) is the pink/brown pill you were taking before.  Take this for your symptoms and see a primary care doctor within 3 days for close follow-up. For any worsening symptoms come back to the emergency department immediately. Thank you.   Many things can cause belly (abdominal) pain. Most times, the belly pain is not dangerous. Many cases of belly pain can be watched and treated at home. HOME CARE   Do not take medicines that help you go poop (laxatives) unless told to by your doctor.  Only take medicine as told by your doctor.  Eat or drink as told by your doctor. Your doctor will tell you if you should be on a special diet. GET HELP IF:  You do not know what is causing your belly pain.  You have belly pain while you are sick to your stomach (nauseous) or have runny poop (diarrhea).  You have pain while you pee or poop.  Your belly pain wakes you up at night.  You have belly pain that gets worse or better when you eat.  You have belly pain that gets worse when you eat fatty foods.  You have a fever. GET HELP RIGHT AWAY IF:   The pain does not go away within 2 hours.  You keep throwing up (vomiting).  The pain changes and is only in the right or left part of the belly.  You have bloody or tarry looking poop. MAKE SURE YOU:   Understand these instructions.  Will watch your condition.  Will get help right away if you are not doing well or get worse. Document Released: 05/29/2008 Document Revised: 12/16/2013 Document Reviewed: 08/20/2013 [facility] Patient Information 2015 [place], [place]. This information is not intended to replace advice given to you by your health care provider. Make sure you discuss any questions you have with your health care provider.

## 2015-07-28 ENCOUNTER — Emergency Department (HOSPITAL_COMMUNITY)
Admission: EM | Admit: 2015-07-28 | Discharge: 2015-07-29 | Disposition: A | Discharge status: Home / Self Care | Payer: Medicaid Other | Attending: Emergency Medicine | Admitting: Emergency Medicine

## 2015-07-28 ENCOUNTER — Encounter (HOSPITAL_COMMUNITY): Payer: Self-pay | Admitting: *Deleted

## 2015-07-28 DIAGNOSIS — E119 Type 2 diabetes mellitus without complications: Secondary | ICD-10-CM | POA: Diagnosis not present

## 2015-07-28 DIAGNOSIS — Z79899 Other long term (current) drug therapy: Secondary | ICD-10-CM | POA: Diagnosis not present

## 2015-07-28 DIAGNOSIS — Z8669 Personal history of other diseases of the nervous system and sense organs: Secondary | ICD-10-CM | POA: Diagnosis not present

## 2015-07-28 DIAGNOSIS — F419 Anxiety disorder, unspecified: Secondary | ICD-10-CM | POA: Insufficient documentation

## 2015-07-28 DIAGNOSIS — F259 Schizoaffective disorder, unspecified: Secondary | ICD-10-CM | POA: Diagnosis not present

## 2015-07-28 DIAGNOSIS — I10 Essential (primary) hypertension: Secondary | ICD-10-CM | POA: Insufficient documentation

## 2015-07-28 DIAGNOSIS — R2243 Localized swelling, mass and lump, lower limb, bilateral: Secondary | ICD-10-CM | POA: Diagnosis present

## 2015-07-28 DIAGNOSIS — E669 Obesity, unspecified: Secondary | ICD-10-CM | POA: Insufficient documentation

## 2015-07-28 DIAGNOSIS — F329 Major depressive disorder, single episode, unspecified: Secondary | ICD-10-CM | POA: Diagnosis not present

## 2015-07-28 DIAGNOSIS — R600 Localized edema: Secondary | ICD-10-CM | POA: Diagnosis not present

## 2015-07-28 DIAGNOSIS — R609 Edema, unspecified: Secondary | ICD-10-CM

## 2015-07-28 NOTE — ED Notes (Signed)
Patient transported to X-ray 

## 2015-07-28 NOTE — ED Provider Notes (Signed)
CSN: [id number]     Arrival date & time 07/28/15  2306 History   First MD Initiated Contact with Patient 07/28/15 2342     Chief Complaint  Patient presents with  . Joint Swelling     (Consider location/radiation/quality/duration/timing/severity/associated sxs/prior Treatment) HPI Comments: Patient is a 34 yo M PMHx significant for DM, HTN, HLD, Schizo affective schizophrenia presenting to the ED for bilateral ankle and hand swelling with some dyspnea on exertion. Patient states the swelling has been present for two days, he states he did not notice he was becoming more short of breath with exertion. Patient states his family mentioned it to him. He endorses an associated mild non-productive cough. PERC negative.    Past Medical History  Diagnosis Date  . Schizo affective schizophrenia   . Diabetes mellitus without complication   . Sleep apnea   . Anxiety   . Depression   . Obesity   . Hypertension   . Hyperlipemia    History reviewed. No pertinent past surgical history. No family history on file. History  Substance Use Topics  . Smoking status: Never Smoker   . Smokeless tobacco: Never Used  . Alcohol Use: No    Review of Systems  Cardiovascular: Positive for leg swelling. Negative for chest pain and palpitations.  All other systems reviewed and are negative.     Allergies  Review of patient's allergies indicates no known allergies.  Home Medications   Prior to Admission medications   Medication Sig Start Date End Date Taking? Authorizing Provider  clonazePAM (KLONOPIN) 1 MG tablet Take 1 mg by mouth 2 (two) times daily.  02/24/15  Yes Historical Provider, MD  lisdexamfetamine (VYVANSE) 30 MG capsule Take 30 mg by mouth daily.   Yes Historical Provider, MD  QUEtiapine (SEROQUEL) 300 MG tablet Take 300 mg by mouth at bedtime.   Yes Historical Provider, MD  furosemide (LASIX) 20 MG tablet Take 1 tablet (20 mg total) by mouth daily. 07/29/15   Maki Hege, PA-C   omeprazole (PRILOSEC) 20 MG capsule Take 1 capsule (20 mg total) by mouth daily. Patient not taking: Reported on 07/29/2015 06/09/15   Tomasita Crumble, MD   BP 166/72 mmHg  Pulse 93  Temp(Src) 98.3 F (36.8 C) (Oral)  Resp 22  Ht 6\' 4"  (1.93 m)  Wt 456 lb 2 oz (206.897 kg)  BMI 55.54 kg/m2  SpO2 96% Physical Exam  Constitutional: He is oriented to person, place, and time. He appears well-developed and well-nourished.  Exam limited due to body habitus.   HENT:  Head: Normocephalic and atraumatic.  Right Ear: External ear normal.  Left Ear: External ear normal.  Nose: Nose normal.  Eyes: Conjunctivae are normal.  Neck: Neck supple.  Cardiovascular: Normal rate, regular rhythm and normal heart sounds.   Pulmonary/Chest: Effort normal and breath sounds normal. No respiratory distress. He has no wheezes. He exhibits no tenderness.  Abdominal: Soft. There is no tenderness.  Musculoskeletal: Normal range of motion. He exhibits edema (Bilateral ankle 1+ non pitting. no erythema or warmth).  Neurological: He is alert and oriented to person, place, and time.  Skin: Skin is warm and dry.  Nursing note and vitals reviewed.   ED Course  Procedures (including critical care time) Medications  furosemide (LASIX) tablet 20 mg (20 mg Oral Given 07/29/15 0131)    Labs Review Labs Reviewed  CBC WITH DIFFERENTIAL/PLATELET - Abnormal; Notable for the following:    Hemoglobin 12.7 (*)    HCT 36.7 (*)  Monocytes Relative 14 (*)    All other components within normal limits  COMPREHENSIVE METABOLIC PANEL - Abnormal; Notable for the following:    Glucose, Bld 106 (*)    Total Protein 6.4 (*)    AST 53 (*)    All other components within normal limits  BRAIN NATRIURETIC PEPTIDE  TROPONIN I    Imaging Review Dg Chest 2 View  07/29/2015   CLINICAL DATA:  Left-sided chest pain and dyspnea.  EXAM: CHEST  2 VIEW  COMPARISON:  None.  FINDINGS: The heart size and mediastinal contours are within normal  limits. Both lungs are clear. The visualized skeletal structures are unremarkable.  IMPRESSION: No active cardiopulmonary disease.   Electronically Signed   By: Ellery Plunk M.D.   On: 07/29/2015 00:50     EKG Interpretation None      MDM   Final diagnoses:  Peripheral edema    Filed Vitals:   07/29/15 0230  BP: 166/72  Pulse: 93  Temp: 98.3 F (36.8 C)  Resp: 22    I have reviewed nursing notes, vital signs, and all lab and all imaging results as noted above.  Patient presenting to the ED for two days of worsening peripheral edema. Family has noticed increased dyspnea on exertion. Patient endorses a night time cough w/o new orthopnea or PND. No upper extremity edema appreciated on examination. Exam is limited due to body habitus however. Bilateral lower extremity edema noted ankles, 1+ nonpitting. No diminished breath sounds appreciated on examination. No hypoxia noted. Will obtain BNP and chest x-ray to evaluate for possible cardiac etiology of peripheral edema.  Chest x-ray is unremarkable, no pulmonary vasculature edema noted. BMP is within normal limits. Troponin is also negative and EKG is unremarkable. So patient will benefit from dose of Lasix for peripheral edema. Patient is able to handle even the emergency department without complaint of shortness of breath. He is also able to handle it without hypoxia. Patient did become tachycardic while ambulating around emergency department, asymptomatic, resolved with rest. Advised PCP follow-up. Return precautions discussed. Patient is agreeable to plan and stable at time of discharge.  Francee Piccolo, PA-C 07/29/15 0510  Blake Divine, MD 07/29/15 [phone]

## 2015-07-28 NOTE — ED Notes (Signed)
The pt is c/o bi-lateral ankle swelling and swollen hands with some back pain fior the past 2 days.  Sob  For 2 days

## 2015-07-29 ENCOUNTER — Emergency Department (HOSPITAL_COMMUNITY): Payer: Medicaid Other

## 2015-07-29 LAB — CBC WITH DIFFERENTIAL/PLATELET
BASOS ABS: 0 10*3/uL (ref 0.0–0.1)
Basophils Relative: 0 % (ref 0–1)
EOS PCT: 4 % (ref 0–5)
Eosinophils Absolute: 0.2 10*3/uL (ref 0.0–0.7)
HCT: 36.7 % — ABNORMAL LOW (ref 39.0–52.0)
Hemoglobin: 12.7 g/dL — ABNORMAL LOW (ref 13.0–17.0)
LYMPHS PCT: 30 % (ref 12–46)
Lymphs Abs: 1.9 10*3/uL (ref 0.7–4.0)
MCH: 29.5 pg (ref 26.0–34.0)
MCHC: 34.6 g/dL (ref 30.0–36.0)
MCV: 85.3 fL (ref 78.0–100.0)
MONOS PCT: 14 % — AB (ref 3–12)
Monocytes Absolute: 0.9 10*3/uL (ref 0.1–1.0)
Neutro Abs: 3.3 10*3/uL (ref 1.7–7.7)
Neutrophils Relative %: 52 % (ref 43–77)
Platelets: 192 10*3/uL (ref 150–400)
RBC: 4.3 MIL/uL (ref 4.22–5.81)
RDW: 14.1 % (ref 11.5–15.5)
WBC: 6.3 10*3/uL (ref 4.0–10.5)

## 2015-07-29 LAB — COMPREHENSIVE METABOLIC PANEL
ALBUMIN: 3.5 g/dL (ref 3.5–5.0)
ALT: 45 U/L (ref 17–63)
AST: 53 U/L — AB (ref 15–41)
Alkaline Phosphatase: 74 U/L (ref 38–126)
Anion gap: 8 (ref 5–15)
BUN: 8 mg/dL (ref 6–20)
CO2: 25 mmol/L (ref 22–32)
Calcium: 8.9 mg/dL (ref 8.9–10.3)
Chloride: 104 mmol/L (ref 101–111)
Creatinine, Ser: 1.09 mg/dL (ref 0.61–1.24)
GFR calc Af Amer: >60 mL/min (ref >60)
GFR calc non Af Amer: >60 mL/min (ref >60)
Glucose, Bld: 106 mg/dL — ABNORMAL HIGH (ref 65–99)
POTASSIUM: 4.2 mmol/L (ref 3.5–5.1)
Sodium: 137 mmol/L (ref 135–145)
TOTAL PROTEIN: 6.4 g/dL — AB (ref 6.5–8.1)
Total Bilirubin: 0.6 mg/dL (ref 0.3–1.2)

## 2015-07-29 LAB — TROPONIN I: Troponin I: <0.03 ng/mL (ref <0.031)

## 2015-07-29 LAB — BRAIN NATRIURETIC PEPTIDE: B NATRIURETIC PEPTIDE 5: 10 pg/mL (ref 0.0–100.0)

## 2015-07-29 MED ORDER — FUROSEMIDE 20 MG PO TABS: 20.0000 mg | ORAL_TABLET | Freq: Every day | ORAL | Status: DC

## 2015-07-29 MED ORDER — FUROSEMIDE 20 MG PO TABS
20.0000 mg | ORAL_TABLET | Freq: Once | ORAL | Status: AC
  Administered 2015-07-29: 20 mg via ORAL
  Filled 2015-07-29: qty 1

## 2015-07-29 NOTE — ED Notes (Signed)
Pt left with all belongings and ambulated out of treatment area.  

## 2015-07-29 NOTE — ED Notes (Signed)
Pt with an O2 sat of 96% and HR of 115 after ambulating around the department.

## 2015-07-29 NOTE — Discharge Instructions (Signed)
Please follow up with your primary care physician in 1-2 days. If you do not have one please call the [facility] number listed above. Please read all discharge instructions and return precautions.  ° ° °Peripheral Edema °You have swelling in your legs (peripheral edema). This swelling is due to excess accumulation of salt and water in your body. Edema may be a sign of heart, kidney or liver disease, or a side effect of a medication. It may also be due to problems in the leg veins. Elevating your legs and using special support stockings may be very helpful, if the cause of the swelling is due to poor venous circulation. Avoid long periods of standing, whatever the cause. °Treatment of edema depends on identifying the cause. Chips, pretzels, pickles and other salty foods should be avoided. Restricting salt in your diet is almost always needed. Water pills (diuretics) are often used to remove the excess salt and water from your body via urine. These medicines prevent the kidney from reabsorbing sodium. This increases urine flow. °Diuretic treatment may also result in lowering of potassium levels in your body. Potassium supplements may be needed if you have to use diuretics daily. Daily weights can help you keep track of your progress in clearing your edema. You should call your caregiver for follow up care as recommended. °SEEK IMMEDIATE MEDICAL CARE IF:  °· You have increased swelling, pain, redness, or heat in your legs. °· You develop shortness of breath, especially when lying down. °· You develop chest or abdominal pain, weakness, or fainting. °· You have a fever. °Document Released: 01/18/2005 Document Revised: 03/04/2012 Document Reviewed: 12/29/2009 °[employer]® Patient Information ©2015 [employer]. This information is not intended to replace advice given to you by your health care provider. Make sure you discuss any questions you have with your health care provider. ° °

## 2015-08-13 ENCOUNTER — Emergency Department (HOSPITAL_COMMUNITY)
Admission: EM | Admit: 2015-08-13 | Discharge: 2015-08-13 | Disposition: A | Discharge status: Home / Self Care | Payer: Medicaid Other | Attending: Emergency Medicine | Admitting: Emergency Medicine

## 2015-08-13 ENCOUNTER — Encounter (HOSPITAL_COMMUNITY): Payer: Self-pay | Admitting: Vascular Surgery

## 2015-08-13 DIAGNOSIS — Z8669 Personal history of other diseases of the nervous system and sense organs: Secondary | ICD-10-CM | POA: Insufficient documentation

## 2015-08-13 DIAGNOSIS — I10 Essential (primary) hypertension: Secondary | ICD-10-CM | POA: Diagnosis not present

## 2015-08-13 DIAGNOSIS — E119 Type 2 diabetes mellitus without complications: Secondary | ICD-10-CM | POA: Diagnosis not present

## 2015-08-13 DIAGNOSIS — E785 Hyperlipidemia, unspecified: Secondary | ICD-10-CM | POA: Diagnosis not present

## 2015-08-13 DIAGNOSIS — E669 Obesity, unspecified: Secondary | ICD-10-CM | POA: Insufficient documentation

## 2015-08-13 DIAGNOSIS — R440 Auditory hallucinations: Secondary | ICD-10-CM | POA: Insufficient documentation

## 2015-08-13 DIAGNOSIS — R451 Restlessness and agitation: Secondary | ICD-10-CM | POA: Diagnosis not present

## 2015-08-13 DIAGNOSIS — Z79899 Other long term (current) drug therapy: Secondary | ICD-10-CM | POA: Insufficient documentation

## 2015-08-13 DIAGNOSIS — R45851 Suicidal ideations: Secondary | ICD-10-CM | POA: Diagnosis present

## 2015-08-13 NOTE — Discharge Instructions (Signed)
Stress and Stress Management °Stress is a normal reaction to life events. It is what you feel when life demands more than you are used to or more than you can handle. Some stress can be useful. For example, the stress reaction can help you catch the last bus of the day, study for a test, or meet a deadline at work. But stress that occurs too often or for too long can cause problems. It can affect your emotional health and interfere with relationships and normal daily activities. Too much stress can weaken your immune system and increase your risk for physical illness. If you already have a medical problem, stress can make it worse. °CAUSES  °All sorts of life events may cause stress. An event that causes stress for one person may not be stressful for another person. Major life events commonly cause stress. These may be positive or negative. Examples include losing your job, moving into a new home, getting married, having a baby, or losing a loved one. Less obvious life events may also cause stress, especially if they occur day after day or in combination. Examples include working long hours, driving in traffic, caring for children, being in debt, or being in a difficult relationship. °SIGNS AND SYMPTOMS °Stress may cause emotional symptoms including, the following: °· Anxiety. This is feeling worried, afraid, on edge, overwhelmed, or out of control. °· Anger. This is feeling irritated or impatient. °· Depression. This is feeling sad, down, helpless, or guilty. °· Difficulty focusing, remembering, or making decisions. °Stress may cause physical symptoms, including the following:  °· Aches and pains. These may affect your head, neck, back, stomach, or other areas of your body. °· Tight muscles or clenched jaw. °· Low energy or trouble sleeping.  °Stress may cause unhealthy behaviors, including the following:  °· Eating to feel better (overeating) or skipping meals. °· Sleeping too little, too much, or both. °· Working  too much or putting off tasks (procrastination). °· Smoking, drinking alcohol, or using drugs to feel better. °DIAGNOSIS  °Stress is diagnosed through an assessment by your health care provider. Your health care provider will ask questions about your symptoms and any stressful life events. Your health care provider will also ask about your medical history and may order blood tests or other tests. Certain medical conditions and medicine can cause physical symptoms similar to stress.  Mental illness can cause emotional symptoms and unhealthy behaviors similar to stress. Your health care provider may refer you to a mental health professional for further evaluation.  °TREATMENT  °Stress management is the recommended treatment for stress. The goals of stress management are reducing stressful life events and coping with stress in healthy ways.  °Techniques for reducing stressful life events include the following: °· Stress identification. Self-monitor for stress and identify what causes stress for you. These skills may help you to avoid some stressful events. °· Time management. Set your priorities, keep a calendar of events, and learn to say "no." These tools can help you avoid making too many commitments. °Techniques for coping with stress include the following: °· Rethinking the problem. Try to think realistically about stressful events rather than ignoring them or overreacting. Try to find the positives in a stressful situation rather than focusing on the negatives. °· Exercise. Physical exercise can release both physical and emotional tension. The key is to find a form of exercise you enjoy and do it regularly. °· Relaxation techniques. These relax the body and mind. Examples include yoga, meditation, tai chi, biofeedback, deep   breathing, progressive muscle relaxation, listening to music, being out in nature, journaling, and other hobbies. Again, the key is to find one or more that you enjoy and can do  regularly.  Healthy lifestyle. Eat a balanced diet, get plenty of sleep, and do not smoke. Avoid using alcohol or drugs to relax.  Strong support network. Spend time with family, friends, or other people you enjoy being around.Express your feelings and talk things over with someone you trust. Counseling or talktherapy with a mental health professional may be helpful if you are having difficulty managing stress on your own. Medicine is typically not recommended for the treatment of stress.Talk to your health care provider if you think you need medicine for symptoms of stress. HOME CARE INSTRUCTIONS  Keep all follow-up visits as directed by your health care provider.  Take all medicines as directed by your health care provider. SEEK MEDICAL CARE IF:  Your symptoms get worse or you start having new symptoms.  You feel overwhelmed by your problems and can no longer manage them on your own. SEEK IMMEDIATE MEDICAL CARE IF:  You feel like hurting yourself or someone else. Document Released: 06/06/2001 Document Revised: 04/27/2014 Document Reviewed: 08/05/2013 [facility] Patient Information 2015 [place], [place]. This information is not intended to replace advice given to you by your health care provider. Make sure you discuss any questions you have with your health care provider.  Anger Management Anger is a normal human emotion. However, anger can range from mild irritation to rage. When your anger becomes harmful to yourself or others, it is unhealthy anger.  CAUSES  There are many reasons for unhealthy anger. Many people learn how to express anger from observing how their family expressed anger. In troubled, chaotic, or abusive families, anger can be expressed as rage or even violence. Children can grow up never learning how healthy anger can be expressed. Factors that contribute to unhealthy anger include:   Drug or alcohol abuse.  Post-traumatic stress disorder.  Traumatic brain  injury. COMPLICATIONS  People with unhealthy anger tend to overreact and retaliate against a real or imagined threat. The need to retaliate can turn into violence or verbal abuse against another person. Chronic anger can lead to health problems, such as hypertension, high blood pressure, and depression. TREATMENT  Exercising, relaxing, meditating, or writing out your feelings all can be beneficial in managing moderate anger. For unhealthy anger, the following methods may be used:  Cognitive-behavioral counseling (learning skills to change the thoughts that influence your mood).  Relaxation training.  Interpersonal counseling.  Assertive communication skills.  Medication. Document Released: 10/08/2007 Document Revised: 03/04/2012 Document Reviewed: 02/16/2011 [facility] Patient Information 2015 [place], [place]. This information is not intended to replace advice given to you by your health care provider. Make sure you discuss any questions you have with your health care provider.   Emergency Department Resource Guide 1) Find a Doctor and Pay Out of Pocket Although you won't have to find out who is covered by your insurance plan, it is a good idea to ask around and get recommendations. You will then need to call the office and see if the doctor you have chosen will accept you as a new patient and what types of options they offer for patients who are self-pay. Some doctors offer discounts or will set up payment plans for their patients who do not have insurance, but you will need to ask so you aren't surprised when you get to your appointment.  2) Contact Your  Local Health Department Not all health departments have doctors that can see patients for sick visits, but many do, so it is worth a call to see if yours does. If you don't know where your local health department is, you can check in your phone book. The CDC also has a tool to help you locate your state's health department, and many state  websites also have listings of all of their local health departments.  3) Find a [facility] If your illness is not likely to be very severe or complicated, you may want to try a walk in clinic. These are popping up all over the country in pharmacies, drugstores, and shopping centers. They're usually staffed by nurse practitioners or physician assistants that have been trained to treat common illnesses and complaints. They're usually fairly quick and inexpensive. However, if you have serious medical issues or chronic medical problems, these are probably not your best option.  No Primary Care Doctor: - Call Health Connect at  [phone] - they can help you locate a primary care doctor that  accepts your insurance, provides certain services, etc. - Physician Referral Service- [phone]  Chronic Pain Problems: Organization         Address  Phone   Notes  [facility]  [phone] Patients need to be referred by their primary care doctor.   Medication Assistance: Organization         Address  Phone   Notes  [facility] Medication [facility] [address] [place], [place] [address] [phone] --Must be a resident of [facility] -- Must have NO insurance coverage whatsoever (no Medicaid/ Medicare, etc.) -- The pt. MUST have a primary care doctor that directs their care regularly and follows them in the community   [facility]ist  [phone]   [employer]  [phone]    Agencies that provide inexpensive medical care: Organization         Address  Phone   Notes  [place]  [phone]   [facility]    [phone]   [facility] [address] [place], [place] [address] [phone]   [place] [address], [place] [phone]   Planned Parenthood    [phone]   [facility]    [phone]   [place] [address] [place] [address], [place] Phone:  [phone], Fax:  [phone] Hours of Operation:  9 am - 6 pm, M-F.  Also accepts Medicaid/Medicare and self-pay.  [facility] for [place], [address], [place] Phone: [phone], Fax: [phone]. Hours of Operation:  8:30 am - 5:30 pm, M-F.  Also accepts Medicaid and self-pay.  [facility] [place] [address], [place] Phone: [phone]   [place], [place], [place] [phone], Ext. [phone] Mondays & Thursdays: 7-9 AM.  First 15 patients are seen on a first come, first serve basis.    [place] Providers:  Organization         Address  Phone   Notes  [facility] [address],  [phone] Also accepts self-pay patients.  [place], [place]  [phone]   [place], [address], [place] [phone])  [place] [address], [place] [phone]   Lucianne Lei [address], [place]   [phone] Only accepts [place] Access [place] patients after they have their name applied to their card.   Self-Pay (no insurance) in [facility]:  Organization         Address  Phone   Notes  Sickle Cell Patients, [facility] [place] [phone]   [facility] Urgent Care [place] [phone]   [facility] [place]  [place], [place], [place] [phone]   [facility]/Dr. Osei-Bonsu  [address] [place] or [address], [place] [phone] Phone number for both [place] and [place] locations is the same.  Urgent Medical and [facility] [address] [place] [phone]   [facility] [address] [place] or [address] [phone]   [facility] [address] [place] [phone], phone; [phone], fax Sees patients 1st and 3rd Saturday of every month.  Must not qualify for public or private insurance (i.e. Medicaid, Medicare, [place] Health Choice, Veterans' Benefits)  Household income should be no more than 200% of the poverty level The clinic cannot treat you if you are pregnant or think you are pregnant  Sexually transmitted diseases are not treated at the clinic.    Dental Care: Organization         Address  Phone  Notes  [facility] Department of [facility] [place] [phone] Accepts children up to age 31 who are enrolled in [place] or [place]; pregnant women with a Medicaid card; and children who have applied for Medicaid or [place] Health Choice, but were declined, whose parents can pay a reduced fee at time of service.  [facility] Department of [facility]  [address], [place] [phone] Accepts children up to age 66 who are enrolled in [place] or [place]; pregnant women with a Medicaid card; and children who have applied for Medicaid or  Health Choice, but were declined, whose parents can pay a reduced fee at time of service.  [place] PRO[facility]  [place] [phone] Patients are seen by appointment only. Walk-ins are not accepted. [place] will see patients 20 years of age and older. Monday - Tuesday (8am-5pm) Most Wednesdays (8:30-5pm) $30 per visit, cash only  [facility] Adult [facility] Access PROGRAM  [address], [facility] [phone] Patients are seen by appointment only. Walk-ins are not accepted. [place] will see patients 80 years of age and older. One Wednesday Evening (Monthly: Volunteer Based).  $30 per visit, cash only  [employer]  [phone] for adults; Children under age 45, call Graduate Pediatric Dentistry at [phone]. Children aged 36-14, please call [phone] to request a pediatric application.  Dental services are provided in all areas of dental care including fillings, crowns and bridges, complete and partial dentures, implants, gum treatment, root canals, and extractions. Preventive care is also provided. Treatment is provided to both adults and children. Patients are selected via a lottery and there is often a waiting list.   [facility] [address], [place]  [phone])  [phone] [email or web address]   [facility] [address] [place], [place] [phone], Ext. [phone] Second and Fourth Thursday of each month, opens at 6:30 AM; Clinic ends at 9 AM.  Patients are seen on a first-come first-served basis, and a limited number are seen during each clinic.   [facility]  [address] Hillard Danker [place], [place] [phone]   Eligibility Requirements You must have lived in [place], [place], or [place] counties for at least the last three months.   You cannot be eligible for state or federal sponsored [employer], including [employer], [place], or [employer].   You generally cannot be eligible for healthcare insurance through your employer.    How to apply: Eligibility screenings are held every Tuesday and Wednesday afternoon from 1:00 pm until 4:00 pm. You do not need an appointment for the interview!  [facility] [place] [address], [place], [place]   [place]  [place] Department  [place]  [phone]    Behavioral Health Resources in the Community: Intensive Outpatient Programs Organization         Address  Phone  Notes  [place]. [address], [place], [place]  [phone]   [facility] Outpatient [address] [place], [place]   ADS: Alcohol & Drug Svcs [address] [place], [place]   [place] [address],  [place], [place] or [phone]   Substance Abuse Resources Organization         Address  Phone  Notes  Alcohol and Drug Services  [phone]   [place]  [phone]   [facility] [place]   Chinita Pester  [phone]   Residential & Outpatient Substance Abuse Program  [phone]   Psychological Services Organization         Address  Phone  Notes  [facility] [place]  [place]  [phone]   [place] [address], [place] or [phone]    Mobile Crisis Teams Organization         Address  Phone  Notes  [facility], Mobile Crisis Care Unit  [phone]   Assertive Psychotherapeutic Services  [address] [place], [place]   [place] [address] [place] [phone]    Self-Help/Support Groups Organization         Address  Phone             Notes  [place]. of [place] - variety of support groups  [place] Call for more information  Narcotics Anonymous (NA), [facility] [address], [employer] [place]  2 meetings at this location   Special educational needs teacher         Address  Phone  Notes  [facility] [place],    [place]  [phone]   [facility]  [address], [place] [address], [place], [place]   [place], [place] [phone] Admissions: 8am-3pm M-F  Incentives Substance [place] [address]    [place], [place] [phone]   [facility] [address] Jadene Pierini [place], [place]   [facility] [place] [address]   [place], [place]   [facility] -  Intensive Outpatient [address], [place], [place] [phone]   [facility] [address]  [place], [place] [phone] or [phone]   [facility] (RTS) [address], [place], [place] Accepts Medicaid  [facility] [place] [address],  [place] [phone] Substance Abuse/Addiction Treatment   [facility] Organization         Address  Phone  Notes  [facility]  [phone]   Domenic Schwab, PhD [address] Arlis Port[place], [place]   [phone] or [phone]   [place] Kellogg Williams [place], [place] [phone]   [address], [place], [place] [phone] Insurance/Medicaid/sponsorship through [facility] and Families [address], Ste [place]                                    [place], [place] [phone] [place] [address], [place] [phone]    Dr. Adele Schilder  [phone]   [facility] Dept. 1) [address], [place] 2) [place] 3)  [address], [place] [phone]  [phone]   Start [phone] or [phone] (After Hours)

## 2015-08-13 NOTE — ED Notes (Signed)
Pt reports to the ED for eval of SI/HI. He reports he has a hx of schizophrenia and he has been compliant with all of his psych medication but he reports that since he moved here with his wife a year ago they have been arguing a lot and tonight he heard voices telling him to hurt her. Denies any plan, substance abuse, or hallucinations at this time. Pt reports a 4/10 HA at this time but denies any other complaints. Denies any substance abuse. Pt A&Ox4, resp e/u, and skin warm and dry.

## 2015-08-13 NOTE — ED Provider Notes (Signed)
CSN: [id number]     Arrival date & time 08/13/15  2148 History   First MD Initiated Contact with Patient 08/13/15 2203     Chief Complaint  Patient presents with  . Homicidal  . Suicidal    (Consider location/radiation/quality/duration/timing/severity/associated sxs/prior Treatment) HPI Comments: 34 year old male with history of schizoaffective schizophrenia, diabetes mellitus, depression, hypertension, and dyslipidemia presents to the emergency department for further evaluation of agitation. Patient reports that he got into an argument with his wife this evening which caused him to become very angry. He states that his level of anger prompted him to call the police. He states that when he lived in [place] he had a place where he could go to "cool off" for sure period of time before he returned home. He states that, since moving to [place], he has not had a similar place that he can go to. He states that he has been talking to his psychiatrist about this. He has plans to enter a program on Monday to help with anger and stress management as well as social skills. He reports dealing hallucinations, but explicitly denies command hallucinations. He denies any suicidal or homicidal thoughts. Patient states that he was brought in by [facility] who urged him to speak to someone. He does not believe he needs to be evaluated by a counselor. Patient has been compliant with his psychiatric medications.  The history is provided by the patient. No language interpreter was used.    Past Medical History  Diagnosis Date  . Schizo affective schizophrenia   . Diabetes mellitus without complication   . Sleep apnea   . Anxiety   . Depression   . Obesity   . Hypertension   . Hyperlipemia    No past surgical history on file. No family history on file. Social History  Substance Use Topics  . Smoking status: Never Smoker   . Smokeless tobacco: Never Used  . Alcohol Use: No    Review of Systems   Psychiatric/Behavioral: Positive for hallucinations, behavioral problems and agitation. Negative for suicidal ideas.  All other systems reviewed and are negative.   Allergies  Review of patient's allergies indicates no known allergies.  Home Medications   Prior to Admission medications   Medication Sig Start Date End Date Taking? Authorizing Provider  clonazePAM (KLONOPIN) 1 MG tablet Take 1 mg by mouth 2 (two) times daily.  02/24/15   Historical Provider, MD  furosemide (LASIX) 20 MG tablet Take 1 tablet (20 mg total) by mouth daily. 07/29/15   Jennifer Piepenbrink, PA-C  lisdexamfetamine (VYVANSE) 30 MG capsule Take 30 mg by mouth daily.    Historical Provider, MD  omeprazole (PRILOSEC) 20 MG capsule Take 1 capsule (20 mg total) by mouth daily. Patient not taking: Reported on 07/29/2015 06/09/15   Tomasita Crumble, MD  QUEtiapine (SEROQUEL) 300 MG tablet Take 300 mg by mouth at bedtime.    Historical Provider, MD   BP 160/100 mmHg  Pulse 97  Temp(Src) 98.7 F (37.1 C) (Oral)  Resp 18  Ht  (1.93 m)  Wt 440 lb (199.583 kg)  BMI 53.58 kg/m2  SpO2 94%   Physical Exam  Constitutional: He is oriented to person, place, and time. He appears well-developed and well-nourished. No distress.  Nontoxic/nonseptic appearing  HENT:  Head: Normocephalic and atraumatic.  Eyes: Conjunctivae and EOM are normal. No scleral icterus.  Neck: Normal range of motion.  Pulmonary/Chest: Effort normal. No respiratory distress.  Respirations even and unlabored  Musculoskeletal: Normal range of motion.  Neurological: He is alert and oriented to person, place, and time. He exhibits normal muscle tone. Coordination normal.  GCS 15. Patient moving all extremities.  Skin: Skin is warm and dry. No rash noted. He is not diaphoretic. No erythema. No pallor.  Psychiatric: He has a normal mood and affect. His speech is normal. He is actively hallucinating. He expresses no homicidal and no suicidal ideation. He  expresses no suicidal plans and no homicidal plans.  Patient denies command hallucinations, suicidal ideations, and homicidal thoughts.  Nursing note and vitals reviewed.   ED Course  Procedures (including critical care time) Labs Review Labs Reviewed - No data to display  Imaging Review No results found. I have personally reviewed and evaluated these images and lab results as part of my medical decision-making.   EKG Interpretation None      MDM   Final diagnoses:  Agitation    35 year old male with history of schizoaffective schizophrenia presents to the emergency department with [facility]. He reports that he had an argument with his wife this evening which prompted him to call the police. He denies any suicidal or homicidal thoughts. He states that he has calmed down and he wishes to go back home. He reports having hallucinations on a daily basis, but denies any command hallucinations. He denies hearing voices telling him to hurt his wife. I have spoken with the patient's wife, Muslima. She is comfortable with the patient returning home. She denies the patient ever expressing suicidal ideations at home. I do not believe the patient is a danger to himself or others. Plan to d/c the patient with outpatient resource guide.   Filed Vitals:   08/13/15 2155  BP: 160/100  Pulse: 97  Temp: 98.7 F (37.1 C)  TempSrc: Oral  Resp: 18  Height: 6\' 4"  (1.93 m)  Weight: 440 lb (199.583 kg)  SpO2: 94%     Antony Madura, PA-C 08/13/15 2252  Elwin Mocha, MD 08/14/15 0009

## 2015-10-15 ENCOUNTER — Encounter (HOSPITAL_COMMUNITY): Payer: Self-pay | Admitting: Emergency Medicine

## 2015-10-15 ENCOUNTER — Emergency Department (HOSPITAL_COMMUNITY)
Admission: EM | Admit: 2015-10-15 | Discharge: 2015-10-15 | Disposition: A | Discharge status: Home / Self Care | Payer: Medicaid Other | Attending: Emergency Medicine | Admitting: Emergency Medicine

## 2015-10-15 DIAGNOSIS — Z8669 Personal history of other diseases of the nervous system and sense organs: Secondary | ICD-10-CM | POA: Diagnosis not present

## 2015-10-15 DIAGNOSIS — F439 Reaction to severe stress, unspecified: Secondary | ICD-10-CM

## 2015-10-15 DIAGNOSIS — I10 Essential (primary) hypertension: Secondary | ICD-10-CM | POA: Insufficient documentation

## 2015-10-15 DIAGNOSIS — E119 Type 2 diabetes mellitus without complications: Secondary | ICD-10-CM | POA: Insufficient documentation

## 2015-10-15 DIAGNOSIS — F430 Acute stress reaction: Secondary | ICD-10-CM | POA: Insufficient documentation

## 2015-10-15 DIAGNOSIS — Z008 Encounter for other general examination: Secondary | ICD-10-CM | POA: Diagnosis present

## 2015-10-15 DIAGNOSIS — Z79899 Other long term (current) drug therapy: Secondary | ICD-10-CM | POA: Insufficient documentation

## 2015-10-15 DIAGNOSIS — F419 Anxiety disorder, unspecified: Secondary | ICD-10-CM | POA: Diagnosis not present

## 2015-10-15 NOTE — Discharge Instructions (Signed)
Stress and Stress Management °Stress is a normal reaction to life events. It is what you feel when life demands more than you are used to or more than you can handle. Some stress can be useful. For example, the stress reaction can help you catch the last bus of the day, study for a test, or meet a deadline at work. But stress that occurs too often or for too long can cause problems. It can affect your emotional health and interfere with relationships and normal daily activities. Too much stress can weaken your immune system and increase your risk for physical illness. If you already have a medical problem, stress can make it worse. °CAUSES  °All sorts of life events may cause stress. An event that causes stress for one person may not be stressful for another person. Major life events commonly cause stress. These may be positive or negative. Examples include losing your job, moving into a new home, getting married, having a baby, or losing a loved one. Less obvious life events may also cause stress, especially if they occur day after day or in combination. Examples include working long hours, driving in traffic, caring for children, being in debt, or being in a difficult relationship. °SIGNS AND SYMPTOMS °Stress may cause emotional symptoms including, the following: °· Anxiety. This is feeling worried, afraid, on edge, overwhelmed, or out of control. °· Anger. This is feeling irritated or impatient. °· Depression. This is feeling sad, down, helpless, or guilty. °· Difficulty focusing, remembering, or making decisions. °Stress may cause physical symptoms, including the following:  °· Aches and pains. These may affect your head, neck, back, stomach, or other areas of your body. °· Tight muscles or clenched jaw. °· Low energy or trouble sleeping.  °Stress may cause unhealthy behaviors, including the following:  °· Eating to feel better (overeating) or skipping meals. °· Sleeping too little, too much, or both. °· Working  too much or putting off tasks (procrastination). °· Smoking, drinking alcohol, or using drugs to feel better. °DIAGNOSIS  °Stress is diagnosed through an assessment by your health care provider. Your health care provider will ask questions about your symptoms and any stressful life events. Your health care provider will also ask about your medical history and may order blood tests or other tests. Certain medical conditions and medicine can cause physical symptoms similar to stress.  Mental illness can cause emotional symptoms and unhealthy behaviors similar to stress. Your health care provider may refer you to a mental health professional for further evaluation.  °TREATMENT  °Stress management is the recommended treatment for stress. The goals of stress management are reducing stressful life events and coping with stress in healthy ways.  °Techniques for reducing stressful life events include the following: °· Stress identification. Self-monitor for stress and identify what causes stress for you. These skills may help you to avoid some stressful events. °· Time management. Set your priorities, keep a calendar of events, and learn to say "no." These tools can help you avoid making too many commitments. °Techniques for coping with stress include the following: °· Rethinking the problem. Try to think realistically about stressful events rather than ignoring them or overreacting. Try to find the positives in a stressful situation rather than focusing on the negatives. °· Exercise. Physical exercise can release both physical and emotional tension. The key is to find a form of exercise you enjoy and do it regularly. °· Relaxation techniques. These relax the body and mind. Examples include yoga, meditation, tai chi, biofeedback, deep   breathing, progressive muscle relaxation, listening to music, being out in nature, journaling, and other hobbies. Again, the key is to find one or more that you enjoy and can do  regularly.  Healthy lifestyle. Eat a balanced diet, get plenty of sleep, and do not smoke. Avoid using alcohol or drugs to relax.  Strong support network. Spend time with family, friends, or other people you enjoy being around.Express your feelings and talk things over with someone you trust. Counseling or talktherapy with a mental health professional may be helpful if you are having difficulty managing stress on your own. Medicine is typically not recommended for the treatment of stress.Talk to your health care provider if you think you need medicine for symptoms of stress. HOME CARE INSTRUCTIONS  Keep all follow-up visits as directed by your health care provider.  Take all medicines as directed by your health care provider. SEEK MEDICAL CARE IF:  Your symptoms get worse or you start having new symptoms.  You feel overwhelmed by your problems and can no longer manage them on your own. SEEK IMMEDIATE MEDICAL CARE IF:  You feel like hurting yourself or someone else.   This information is not intended to replace advice given to you by your health care provider. Make sure you discuss any questions you have with your health care provider.   Document Released: 06/06/2001 Document Revised: 01/01/2015 Document Reviewed: 08/05/2013 Elsevier Interactive Patient Education [employer].

## 2015-10-15 NOTE — ED Provider Notes (Signed)
CSN: [id number]     Arrival date & time 10/15/15  1211 History   First MD Initiated Contact with Patient 10/15/15 1227     Chief Complaint  Patient presents with  . Medical Clearance  . Stress     (Consider location/radiation/quality/duration/timing/severity/associated sxs/prior Treatment) HPI Comments: 34 year old male presenting voluntarily feeling stressed out. States he is about to be homeless which is making him very stressed in causing him guilt because his wife and son are about to be homeless and it is his fault. Once he got to the emergency department he started to feel better and states he "just needed to walk away". Denies suicidal or homicidal ideations. Denies any alcohol or drug use. Initially stated he hears "quotes" in his head, however on my evaluation, patient is denying this and stating he is fine.  The history is provided by the patient.    Past Medical History  Diagnosis Date  . Schizo affective schizophrenia (HCC)   . Diabetes mellitus without complication (HCC)   . Sleep apnea   . Anxiety   . Depression   . Obesity   . Hypertension   . Hyperlipemia    No past surgical history on file. No family history on file. Social History  Substance Use Topics  . Smoking status: Never Smoker   . Smokeless tobacco: Never Used  . Alcohol Use: No    Review of Systems  Psychiatric/Behavioral: Positive for dysphoric mood. Negative for hallucinations.  All other systems reviewed and are negative.     Allergies  Review of patient's allergies indicates no known allergies.  Home Medications   Prior to Admission medications   Medication Sig Start Date End Date Taking? Authorizing Provider  clonazePAM (KLONOPIN) 2 MG tablet Take 2 mg by mouth 3 (three) times daily. 09/30/15  Yes Historical Provider, MD  LATUDA 60 MG TABS Take 60 mg by mouth at bedtime. 09/24/15  Yes Historical Provider, MD  temazepam (RESTORIL) 30 MG capsule Take 30 mg by mouth at bedtime as needed  for sleep.  10/05/15  Yes Historical Provider, MD  VYVANSE 50 MG capsule Take 50 mg by mouth every morning. 09/24/15  Yes Historical Provider, MD  furosemide (LASIX) 20 MG tablet Take 1 tablet (20 mg total) by mouth daily. Patient not taking: Reported on 10/15/2015 07/29/15   Francee Piccolo, PA-C  omeprazole (PRILOSEC) 20 MG capsule Take 1 capsule (20 mg total) by mouth daily. Patient not taking: Reported on 07/29/2015 06/09/15   Tomasita Crumble, MD   BP 143/100 mmHg  Pulse 96  Temp(Src) 98.8 F (37.1 C) (Oral)  Resp 18  SpO2 97% Physical Exam  Constitutional: He is oriented to person, place, and time. He appears well-developed and well-nourished. No distress.  Morbidly obese.  HENT:  Head: Normocephalic and atraumatic.  Eyes: Conjunctivae and EOM are normal. Pupils are equal, round, and reactive to light.  Neck: Normal range of motion. Neck supple.  Cardiovascular: Normal rate, regular rhythm and normal heart sounds.   Pulmonary/Chest: Effort normal and breath sounds normal.  Musculoskeletal: Normal range of motion. He exhibits no edema.  Neurological: He is alert and oriented to person, place, and time.  Skin: Skin is warm and dry.  Psychiatric: He has a normal mood and affect. His speech is normal and behavior is normal. He is not actively hallucinating. He expresses no homicidal and no suicidal ideation.  Nursing note and vitals reviewed.   ED Course  Procedures (including critical care time) Labs Review Labs Reviewed -  No data to display  Imaging Review No results found. I have personally reviewed and evaluated these images and lab results as part of my medical decision-making.   EKG Interpretation None      MDM   Final diagnoses:  Feeling stressed out   Nontoxic appearing, NAD. AFVSS. Presenting with feeling stressed out due to possibly losing his home. No SI/HI. No hallucinations. Requesting to be discharged without any further evaluation by psych. I do not feel any  further workup is necessary today as the patient is not expressing any self-harm, suicidal/homicidal ideation, hallucination, confusion. Discussed stress management and advised him to follow-up with his PCP and psychologist. Stable for discharge. Return precautions given. Pt/family/caregiver aware medical decision making process and agreeable with plan.  Kathrynn SpeedRobyn M Shaan Rhoads, PA-C 10/15/15 1245  Lyndal Pulleyaniel Knott, MD 10/16/15 [phone]

## 2015-10-15 NOTE — ED Notes (Addendum)
Pt BIB [facility] voluntarily.  Pt states that he is stressed out because he is about to be homeless.  States that he feels guilt because his wife and son are about to be homeless and it is his fault.  Pt adamantly denies SI/HI.  Denies substance abuse.  Pt states he is hearing "quotes" in his head.

## 2015-12-02 ENCOUNTER — Encounter (HOSPITAL_COMMUNITY): Payer: Self-pay | Admitting: Physical Medicine and Rehabilitation

## 2015-12-02 ENCOUNTER — Emergency Department (HOSPITAL_COMMUNITY)
Admission: EM | Admit: 2015-12-02 | Discharge: 2015-12-03 | Disposition: A | Discharge status: Home / Self Care | Payer: Medicaid Other | Attending: Emergency Medicine | Admitting: Emergency Medicine

## 2015-12-02 DIAGNOSIS — Z79899 Other long term (current) drug therapy: Secondary | ICD-10-CM | POA: Diagnosis not present

## 2015-12-02 DIAGNOSIS — R0602 Shortness of breath: Secondary | ICD-10-CM | POA: Diagnosis present

## 2015-12-02 DIAGNOSIS — F419 Anxiety disorder, unspecified: Secondary | ICD-10-CM | POA: Insufficient documentation

## 2015-12-02 DIAGNOSIS — G4733 Obstructive sleep apnea (adult) (pediatric): Secondary | ICD-10-CM | POA: Diagnosis not present

## 2015-12-02 DIAGNOSIS — E119 Type 2 diabetes mellitus without complications: Secondary | ICD-10-CM | POA: Insufficient documentation

## 2015-12-02 DIAGNOSIS — F209 Schizophrenia, unspecified: Secondary | ICD-10-CM | POA: Diagnosis not present

## 2015-12-02 DIAGNOSIS — E669 Obesity, unspecified: Secondary | ICD-10-CM | POA: Diagnosis not present

## 2015-12-02 DIAGNOSIS — I10 Essential (primary) hypertension: Secondary | ICD-10-CM | POA: Insufficient documentation

## 2015-12-02 DIAGNOSIS — F329 Major depressive disorder, single episode, unspecified: Secondary | ICD-10-CM | POA: Diagnosis not present

## 2015-12-02 NOTE — ED Provider Notes (Signed)
CSN: [id number]     Arrival date & time 12/02/15  2341 History  By signing my name below, I, Allen Sosa, attest that this documentation has been prepared under the direction and in the presence of Tomasita CrumbleAdeleke Shamar Engelmann, MD. Electronically Signed: Bethel BornBritney Sosa, ED Scribe. 12/03/2015. 12:34 AM   Chief Complaint  Patient presents with  . Shortness of Breath   The history is provided by the patient. No language interpreter was used.   Allen Sosa is a 34 y.o. male with history of HTN, HLD, DM, sleep apnea, and obesity who presents to the Emergency Department complaining of chronic SOB with onset years ago. Pt states that he wakes up in the middle of the night gasping for air.  In the past he slept with a CPAP machine that helped, but he lost it in a move. Pt states that he "dozes off" in the middle of the day regardless of what time he goes to bed and that his brain feels "weak" throughout the day, this has been worse over the last several weeks. He has a scheduled appointment in 2 months for a sleep study, but he states that it is too far away. Pt denies fever, rhinorrhea, cough, and chest pain.  Past Medical History  Diagnosis Date  . Schizo affective schizophrenia (HCC)   . Diabetes mellitus without complication (HCC)   . Sleep apnea   . Anxiety   . Depression   . Obesity   . Hypertension   . Hyperlipemia    History reviewed. No pertinent past surgical history. History reviewed. No pertinent family history. Social History  Substance Use Topics  . Smoking status: Never Smoker   . Smokeless tobacco: Never Used  . Alcohol Use: No    Review of Systems 10 Systems reviewed and all are negative for acute change except as noted in the HPI.  Allergies  Review of patient's allergies indicates no known allergies.  Home Medications   Prior to Admission medications   Medication Sig Start Date End Date Taking? Authorizing Provider  clonazePAM (KLONOPIN) 2 MG tablet Take 2 mg by mouth 3  (three) times daily. 09/30/15   Historical Provider, MD  furosemide (LASIX) 20 MG tablet Take 1 tablet (20 mg total) by mouth daily. Patient not taking: Reported on 10/15/2015 07/29/15   Victorino DikeJennifer Piepenbrink, PA-C  LATUDA 60 MG TABS Take 60 mg by mouth at bedtime. 09/24/15   Historical Provider, MD  omeprazole (PRILOSEC) 20 MG capsule Take 1 capsule (20 mg total) by mouth daily. Patient not taking: Reported on 07/29/2015 06/09/15   Tomasita CrumbleAdeleke Maralee Higuchi, MD  temazepam (RESTORIL) 30 MG capsule Take 30 mg by mouth at bedtime as needed for sleep.  10/05/15   Historical Provider, MD  VYVANSE 50 MG capsule Take 50 mg by mouth every morning. 09/24/15   Historical Provider, MD   BP 134/96 mmHg  Pulse 79  Temp(Src) 98.5 F (36.9 C) (Oral)  Resp 18  Ht 6\' 4"  (1.93 m)  Wt 440 lb (199.583 kg)  BMI 53.58 kg/m2  SpO2 99% Physical Exam  Constitutional: He is oriented to person, place, and time. Vital signs are normal. He appears well-developed and well-nourished.  Non-toxic appearance. He does not appear ill. No distress.  HENT:  Head: Normocephalic and atraumatic.  Nose: Nose normal.  Mouth/Throat: Oropharynx is clear and moist. No oropharyngeal exudate.  Eyes: Conjunctivae and EOM are normal. Pupils are equal, round, and reactive to light. No scleral icterus.  Neck: Normal range of motion.  Neck supple. No tracheal deviation, no edema, no erythema and normal range of motion present. No thyroid mass and no thyromegaly present.  Cardiovascular: Normal rate, regular rhythm, S1 normal, S2 normal, normal heart sounds, intact distal pulses and normal pulses.  Exam reveals no gallop and no friction rub.   No murmur heard. Pulmonary/Chest: Effort normal and breath sounds normal. No respiratory distress. He has no wheezes. He has no rhonchi. He has no rales.  Abdominal: Soft. Normal appearance and bowel sounds are normal. He exhibits no distension, no ascites and no mass. There is no hepatosplenomegaly. There is no tenderness.  There is no rebound, no guarding and no CVA tenderness.  Musculoskeletal: Normal range of motion. He exhibits no edema or tenderness.  Lymphadenopathy:    He has no cervical adenopathy.  Neurological: He is alert and oriented to person, place, and time. He has normal strength. No cranial nerve deficit or sensory deficit.  Skin: Skin is warm, dry and intact. No petechiae and no rash noted. He is not diaphoretic. No erythema. No pallor.  Psychiatric: He has a normal mood and affect. His behavior is normal. Judgment normal.  Nursing note and vitals reviewed.   ED Course  Procedures (including critical care time)  DIAGNOSTIC STUDIES: Oxygen Saturation is 99% on RA,  normal by my interpretation.    COORDINATION OF CARE: 12:25 AM Discussed treatment plan which includes lab work, CXR, EKG with pt at bedside and pt agreed to plan.  Labs Review Labs Reviewed  COMPREHENSIVE METABOLIC PANEL - Abnormal; Notable for the following:    Creatinine, Ser 1.38 (*)    All other components within normal limits  CBC WITH DIFFERENTIAL/PLATELET  Rosezena Sensor, ED    Imaging Review Dg Chest 2 View  12/03/2015  CLINICAL DATA:  Shortness of breath.  Sleep apnea. EXAM: CHEST  2 VIEW COMPARISON:  07/28/2015 FINDINGS: The heart size and mediastinal contours are within normal limits. Both lungs are clear. The visualized skeletal structures are unremarkable. IMPRESSION: No active cardiopulmonary disease. Electronically Signed   By: Myles Rosenthal M.D.   On: 12/03/2015 00:05   I have personally reviewed and evaluated these images and lab results as part of my medical decision-making.   EKG Interpretation   Date/Time:  Thursday December 02 2015 23:51:47 EST Ventricular Rate:  76 PR Interval:  170 QRS Duration: 80 QT Interval:  386 QTC Calculation: 434 R Axis:   18 Text Interpretation:  Normal sinus rhythm Normal ECG No significant change  since last tracing Confirmed by Erroll Luna [phone]) on  12/03/2015  12:08:13 AM      MDM   Final diagnoses:  OSA (obstructive sleep apnea)   Patient presents to the emergency department for shortness of breath. This is likely related to his obstructive sleep apnea. Extensive education was provided. He was offered to stay in emergency department to see social work for [facility] at [facility]. He elects to leave and possibly come back. He also has a sleep appointment in the next 2 months. He was advised to call and schedule an earlier appointment. Patient states good understanding of the plan. He appears well and in no acute distress, vital signs were within his normal limits and he is safe for discharge.   I personally performed the services described in this documentation, which was scribed in my presence. The recorded information has been reviewed and is accurate.      Tomasita Crumble, MD 12/03/15 [phone]

## 2015-12-02 NOTE — ED Notes (Signed)
Pt reports SOB x2 days, increases on exertion. Respirations unlabored upon arrival to ED. Speaking complete sentences. Pt is alert and oriented x4.

## 2015-12-03 ENCOUNTER — Emergency Department (HOSPITAL_COMMUNITY): Payer: Medicaid Other

## 2015-12-03 LAB — CBC WITH DIFFERENTIAL/PLATELET
Basophils Absolute: 0 10*3/uL (ref 0.0–0.1)
Basophils Relative: 1 %
EOS ABS: 0.1 10*3/uL (ref 0.0–0.7)
Eosinophils Relative: 2 %
HCT: 39.6 % (ref 39.0–52.0)
Hemoglobin: 13.9 g/dL (ref 13.0–17.0)
LYMPHS ABS: 2 10*3/uL (ref 0.7–4.0)
LYMPHS PCT: 34 %
MCH: 29.1 pg (ref 26.0–34.0)
MCHC: 35.1 g/dL (ref 30.0–36.0)
MCV: 83 fL (ref 78.0–100.0)
MONO ABS: 0.9 10*3/uL (ref 0.1–1.0)
Monocytes Relative: 15 %
Neutro Abs: 2.9 10*3/uL (ref 1.7–7.7)
Neutrophils Relative %: 48 %
PLATELETS: 206 10*3/uL (ref 150–400)
RBC: 4.77 MIL/uL (ref 4.22–5.81)
RDW: 13.6 % (ref 11.5–15.5)
WBC: 5.9 10*3/uL (ref 4.0–10.5)

## 2015-12-03 LAB — I-STAT TROPONIN, ED: Troponin i, poc: 0 ng/mL (ref 0.00–0.08)

## 2015-12-03 LAB — COMPREHENSIVE METABOLIC PANEL
ALT: 41 U/L (ref 17–63)
AST: 40 U/L (ref 15–41)
Albumin: 3.9 g/dL (ref 3.5–5.0)
Alkaline Phosphatase: 79 U/L (ref 38–126)
Anion gap: 10 (ref 5–15)
BUN: 8 mg/dL (ref 6–20)
CHLORIDE: 102 mmol/L (ref 101–111)
CO2: 26 mmol/L (ref 22–32)
CREATININE: 1.38 mg/dL — AB (ref 0.61–1.24)
Calcium: 9.4 mg/dL (ref 8.9–10.3)
GFR calc non Af Amer: >60 mL/min (ref >60)
Glucose, Bld: 96 mg/dL (ref 65–99)
POTASSIUM: 4.3 mmol/L (ref 3.5–5.1)
Sodium: 138 mmol/L (ref 135–145)
Total Bilirubin: 0.4 mg/dL (ref 0.3–1.2)
Total Protein: 7.6 g/dL (ref 6.5–8.1)

## 2015-12-03 NOTE — Discharge Instructions (Signed)
Sleep Apnea Allen Sosa, you will need a CPAP mask to help with your symptoms and to treat your OSA.  Weight loss will tremendously help as well.  Call your doctor to get an earlier appointment for treatment.  If any symptoms worsen, come back to the ED immediately. Thank you. Sleep apnea is disorder that affects a person's sleep. A person with sleep apnea has abnormal pauses in their breathing when they sleep. It is hard for them to get a good sleep. This makes a person tired during the day. It also can lead to other physical problems. There are three types of sleep apnea. One type is when breathing stops for a short time because your airway is blocked (obstructive sleep apnea). Another type is when the brain sometimes fails to give the normal signal to breathe to the muscles that control your breathing (central sleep apnea). The third type is a combination of the other two types. HOME CARE  Do not sleep on your back. Try to sleep on your side.  Take all medicine as told by your doctor.  Avoid alcohol, calming medicines (sedatives), and depressant drugs.  Try to lose weight if you are overweight. Talk to your doctor about a healthy weight goal. Your doctor may have you use a device that helps to open your airway. It can help you get the air that you need. It is called a positive airway pressure (PAP) device. There are three types of PAP devices:  Continuous positive airway pressure (CPAP) device.  Nasal expiratory positive airway pressure (EPAP) device.  Bilevel positive airway pressure (BPAP) device. MAKE SURE YOU:  Understand these instructions.  Will watch your condition.  Will get help right away if you are not doing well or get worse.   This information is not intended to replace advice given to you by your health care provider. Make sure you discuss any questions you have with your health care provider.   Document Released: 09/19/2008 Document Revised: 01/01/2015 Document Reviewed:  04/13/2012 Elsevier Interactive Patient Education 2016 Elsevier Inc. CPAP and BIPAP Information CPAP and BIPAP are methods of helping you breathe with the use of air pressure. CPAP stands for "continuous positive airway pressure." BIPAP stands for "bi-level positive airway pressure." In both methods, air is blown into your air passages to help keep you breathing well. With CPAP, the amount of pressure stays the same while you breathe in and out. CPAP is most commonly used for obstructive sleep apnea. For obstructive sleep apnea, CPAP works by holding your airways open so that they do not collapse when your muscles relax during sleep. BIPAP is similar to CPAP except the amount of pressure is increased when you inhale. This helps you take larger breaths. Your health care provider will recommend whether CPAP or BIPAP would be more helpful for you.  WHY ARE CPAP AND BIPAP TREATMENTS USED? CPAP or BIPAP can be helpful if you have:   Sleep apnea.   Chronic obstructive pulmonary disease (COPD).   Diseases that weaken the muscles of the chest, including muscular dystrophy or neurological diseases such as amyotrophic lateral sclerosis (ALS).  Other problems that cause breathing to be weak, abnormal, or difficult.  HOW IS CPAP OR BIPAP ADMINISTERED? Both CPAP and BIPAP are provided by a small machine with a flexible plastic tube that attaches to a plastic mask. The mask fits on your face, and air is blown into your air passages through your nose or mouth. The amount of pressure that is  used to blow the air into your air passages can be set on the machine. Your health care provider will determine the pressure setting that should be used based on your individual needs. WHEN SHOULD CPAP OR BIPAP BE USED? In most cases, the mask is worn only when sleeping. Generally, you will need to wear the mask throughout the night and during the daytime if you take a nap. In a few cases involving certain medical  conditions, people also need to wear the mask at other times when they are awake. Follow your health care provider's instructions for when to use the machine.  USING THE MASK  Because the mask needs to be snug, some people feel a trapped or closed-in feeling (claustrophobic) when first using the mask. You may need to get used to the mask gradually. To do this, you can first hold the mask loosely over your nose or mouth. Gradually apply the mask more snugly. You can also gradually increase the amount of time that you use the mask.  Masks are available in various types and sizes. Some fit over your mouth and nose, and some fit over just your nose. If your mask does not fit well, talk to your health care provider about getting a different one.  If you are using a nasal mask and you tend to breathe through your mouth, a chin strap may be applied to help keep your mouth closed.   The CPAP and BIPAP machines have alarms that may sound if the mask comes off or develops a leak.  If you have trouble with the mask, it is very important that you talk to your health care provider about finding a way to make the mask easier to tolerate. Do not stop using the mask. This could have a negative impact on your health. TIPS FOR USING THE MACHINE  Place your CPAP or BIPAP machine on a secure table or stand near an electrical outlet.   Know where the on-off switch is located on the machine.  Follow your health care provider's instructions for how to set the pressure on your machine and when you should use it.  Do not eat or drink while the CPAP or BIPAP machine is on. Food or fluids could get pushed into your lungs by the pressure of the CPAP or BIPAP.  Do not smoke. Tobacco smoke residue can damage the machine.   For home use, CPAP and BIPAP machines can be rented or purchased through home health care companies. Many different brands of machines are available. Renting a machine before purchasing may help you  find out which particular machine works well for you. SEEK IMMEDIATE MEDICAL CARE IF:  You have redness or open areas around your nose or mouth where the mask fits.   You have trouble operating the CPAP or BIPAP machine.   You cannot tolerate wearing the CPAP or BIPAP mask.    This information is not intended to replace advice given to you by your health care provider. Make sure you discuss any questions you have with your health care provider.   Document Released: 09/08/2004 Document Revised: 01/01/2015 Document Reviewed: 07/10/2013 Elsevier Interactive Patient Education [employer]2016 Elsevier Inc.

## 2015-12-03 NOTE — ED Notes (Signed)
Pt left with all his belongings and ambulated out of the treatment area.  

## 2016-02-08 ENCOUNTER — Telehealth: Payer: Self-pay

## 2016-02-08 ENCOUNTER — Institutional Professional Consult (permissible substitution): Payer: Medicaid Other | Admitting: Neurology

## 2016-02-08 NOTE — Telephone Encounter (Signed)
Patient came but did not stay for appt due to having another appt at 10:40.

## 2016-02-09 ENCOUNTER — Encounter: Payer: Self-pay | Admitting: Neurology

## 2016-02-09 ENCOUNTER — Telehealth: Payer: Self-pay | Admitting: *Deleted

## 2016-02-09 ENCOUNTER — Ambulatory Visit (INDEPENDENT_AMBULATORY_CARE_PROVIDER_SITE_OTHER): Payer: Medicaid Other | Admitting: Neurology

## 2016-02-09 VITALS — BP 122/78 | HR 78 | Resp 22 | Ht 76.0 in | Wt >= 6400 oz

## 2016-02-09 DIAGNOSIS — R51 Headache: Secondary | ICD-10-CM

## 2016-02-09 DIAGNOSIS — G4719 Other hypersomnia: Secondary | ICD-10-CM

## 2016-02-09 DIAGNOSIS — G4733 Obstructive sleep apnea (adult) (pediatric): Secondary | ICD-10-CM

## 2016-02-09 DIAGNOSIS — R519 Headache, unspecified: Secondary | ICD-10-CM

## 2016-02-09 DIAGNOSIS — G2581 Restless legs syndrome: Secondary | ICD-10-CM

## 2016-02-09 NOTE — Telephone Encounter (Signed)
Release faxed to [facility] requesting records on 02/09/16.

## 2016-02-09 NOTE — Progress Notes (Signed)
Subjective:    Patient ID: Allen Sosa is a 35 y.o. male.  HPI     [name], MD, PhD [facility] [address], [address] [address] [place], [place] [address]  Dear Dr. Regan Lemming,   I saw your patient, Allen Sosa, upon your kind request in my neurologic clinic today for initial consultation of his obstructive sleep apnea, in particular, reevaluation of a prior diagnosis of severe OSA. The patient is unaccompanied today. Of note, the patient no showed for an appointment with me on 11/10/2014 and was not able to keep his appointment for 02/08/2016. As you know, Mr. Allen Sosa is a 35 year old right-handed gentleman with an underlying medical history of reflux disease, mood disorder with a diagnosis of schizoaffective disorder, reactive hypoglycemia, and morbid obesity, who was previously diagnosed with severe obstructive sleep apnea. He had a split-night sleep study on 07/22/2014 which I reviewed. The study was conducted at the [facility] at [facility] [place], [place]. This was a split-night sleep study. Baseline RDI was reported to be 41.6 per hour, arousal index was 17.6 per hour. **and o CPAP was titrated from 4 cm to 10 cm.his sleep disordered breathing was apparently not eliminated with CPAP pressure of 10 cm. Patient apparently could not tolerate higher CPAP pressures and a BiPAP titration was recommended at the time.  He does not have a CPAP or BiPAP machine currently. Originally his sleep apnea was diagnosed in 2013 when he was still residing in [place]. I also reviewed his sleep study results from 04/12/2012 which showed a sleep latency of 28 minutes, REM latency of 35 minutes. His sleep efficiency was 91.5%. His total RDI was 35.1 per hour. Snoring was noted to be severe. Average oxygen saturation was 94%, nadir was 80%. Infrequent PVCs were seen. No significant PLMS were seen. On 04/13/2012 he had an MSLT, mean sleep  latency was 3 minutes with no sleep onset REM periods.  His Epworth sleepiness score is 15 out of 24 today, his fatigue score is 56 out of 63. His bedtime is around 11 PM, rise time is 5 or 6 AM. 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 He lives at home with his wife an 18 year old son. He is a nonsmoker, does not drink alcohol or use illicit drugs. He drinks caffeine in excess in the form of coffee, 8-10 cups per day. He also drinks a lot of water he says. He denies a family history of OSA. He has restless leg symptoms at night and finds it difficult to find a comfortable sleeping position. He ends up sleeping on his stomach with his face in his hands most of the time. His wife has noticed apneic pauses while he is asleep. His snoring is loud. He is a restless sleeper. He wakes up with a headache frequently. He does not have night to night nocturia. This is a dull achy headache typically in the mornings. He never wakes up rested.  His Past Medical History Is Significant For: Past Medical History  Diagnosis Date  . Schizo affective schizophrenia (HCC)   . Diabetes mellitus without complication (HCC)   . Sleep apnea   . Anxiety   . Depression   . Obesity   . Hypertension   . Hyperlipemia     His Past Surgical History Is Significant For: No past surgical history on file.  His Family History Is Significant For: Family History  Problem Relation Age  of Onset  . Heart attack Mother   . Cancer Mother   . Cancer Sister     His Social History Is Significant For: Social History   Social History  . Marital Status: Married    Spouse Name: N/A  . Number of Children: 1  . Years of Education: 11   Occupational History  . [employer]    Social History Main Topics  . Smoking status: Never Smoker   . Smokeless tobacco: Never Used  . Alcohol Use: No  . Drug Use: No  . Sexual Activity: Not Asked   Other Topics Concern  . None   Social History Narrative   Drinks caffeine  daily     His Allergies Are:  No Known Allergies:   His Current Medications Are:  Outpatient Encounter Prescriptions as of 02/09/2016  Medication Sig  . clonazePAM (KLONOPIN) 2 MG tablet Take 2 mg by mouth 3 (three) times daily.  . furosemide (LASIX) 20 MG tablet Take 1 tablet (20 mg total) by mouth daily.  Marland Kitchen LATUDA 60 MG TABS Take 60 mg by mouth at bedtime.  Marland Kitchen omeprazole (PRILOSEC) 20 MG capsule Take 1 capsule (20 mg total) by mouth daily.  . temazepam (RESTORIL) 30 MG capsule Take 30 mg by mouth at bedtime as needed for sleep.   Marland Kitchen VYVANSE 50 MG capsule Take 50 mg by mouth every morning.   No facility-administered encounter medications on file as of 02/09/2016.  :  Review of Systems:  Out of a complete 14 point review of systems, all are reviewed and negative with the exception of these symptoms as listed below:   Review of Systems  Respiratory: Positive for shortness of breath.   Cardiovascular: Positive for chest pain.  Neurological: Positive for headaches.       Patient had sleep study 2-3 years ago. He was put on CPAP but has not used in about 2 years.   Wakes up feeling tired in the morning, daytime tiredness, morning headaches, reports dozing off during the day.   Psychiatric/Behavioral:       Depression and anxiety, hallucinations, racing thoughts.    Epworth Sleepiness Scale 0= would never doze 1= slight chance of dozing 2= moderate chance of dozing 3= high chance of dozing  Sitting and reading:2 Watching TV:3 Sitting inactive in a public place (ex. Theater or meeting):3 As a passenger in a car for an hour without a break:2 Lying down to rest in the afternoon:1 Sitting and talking to someone:2 Sitting quietly after lunch (no alcohol):1 In a car, while stopped in traffic:1 Total:15  Objective:  Neurologic Exam  Physical Exam Physical Examination:   Filed Vitals:   02/09/16 1123  BP: 122/78  Pulse: 78  Resp: 22   General Examination: The patient is a  very pleasant 35 y.o. male in no acute distress. He is morbidly obese, he is adequately groomed.   HEENT: Normocephalic, atraumatic, pupils are equal, round and reactive to light and accommodation. Funduscopic exam is normal with sharp disc margins noted. Extraocular tracking is good without limitation to gaze excursion or nystagmus noted. Normal smooth pursuit is noted. Hearing is grossly intact. Tympanic membranes are clear bilaterally. Face is symmetric with normal facial animation and normal facial sensation. Speech is clear with no dysarthria noted. There is no hypophonia. There is no lip, neck/head, jaw or voice tremor. Neck is supple with full range of passive and active motion. There are no carotid bruits on auscultation. Oropharynx exam reveals: mild mouth dryness,  adequate dental hygiene and severe airway crowding, due Larger tongue, larger tonsils, large uvula, narrow airway entry, thicker soft palate . Mallampati is class III. Tongue protrudes centrally and palate elevates symmetrically. Tonsils appear to be 3+. Neck size is 21 inches.  He has a mild overbite. Nasal inspection reveals no significant nasal mucosal bogginess or redness and no septal deviation.   Chest: Clear to auscultation without wheezing, rhonchi or crackles noted.  Heart: S1+S2+0, regular and normal without murmurs, rubs or gallops noted.   Abdomen: Soft, non-tender and non-distended with normal bowel sounds appreciated on auscultation.  Extremities: There is trace pitting edema in the distal lower extremities bilaterally. Pedal pulses are intact.  Skin: Warm and dry without trophic changes noted. There are no varicose veins.  Musculoskeletal: exam reveals no obvious joint deformities, tenderness or joint swelling or erythema.   Neurologically:  Mental status: The patient is awake, alert and oriented in all 4 spheres. His immediate and remote memory, attention, language skills and fund of knowledge are appropriate.  There is no evidence of aphasia, agnosia, apraxia or anomia. Speech is clear with normal prosody and enunciation. Thought process is linear. Mood is normal and affect is normal.  Cranial nerves II - XII are as described above under HEENT exam. In addition: shoulder shrug is normal with equal shoulder height noted. Motor exam: Normal bulk, strength and tone is noted. There is no drift, tremor or rebound. Romberg is negative. Reflexes are trace to 1+ throughout. Fine motor skills and coordination: intact with normal finger taps, normal hand movements, normal rapid alternating patting, normal foot taps and normal foot agility.  Cerebellar testing: No dysmetria or intention tremor on finger to nose testing. Heel to shin is limited secondary to body habitus.   Sensory exam: intact to light touch, pinprick, vibration, temperature sense in the upper and lower extremities.  Gait, station and balance: He stands with difficulty. No veering to one side is noted. No leaning to one side is noted. Posture is age-appropriate and stance is narrow based. Gait shows normal stride length and normal pace. No problems turning are noted. He turns en bloc. Tandem walk is  slightly difficult for him, likely due to body habitus.               Assessment and Plan:   In summary, Chad Donoghue is a very pleasant 35 y.o.-year old male with an underlying medical history of reflux disease, mood disorder with a diagnosis of schizoaffective disorder, reactive hypoglycemia, and morbid obesity, whose history and physical exam are in keeping with obstructive sleep apnea (OSA). I had a long chat with the patient about my findings and the diagnosis of OSA, its prognosis and treatment options. We talked about medical treatments, surgical interventions and non-pharmacological approaches. I explained in particular the risks and ramifications of untreated moderate to severe OSA, especially with respect to developing cardiovascular disease down  the Road, including congestive heart failure, difficult to treat hypertension, cardiac arrhythmias, or stroke. Even type 2 diabetes has, in part, been linked to untreated OSA. Symptoms of untreated OSA include daytime sleepiness, memory problems, mood irritability and mood disorder such as depression and anxiety, lack of energy, as well as recurrent headaches, especially morning headaches. We talked about limiting caffeine and trying to maintain a healthy lifestyle in general, as well as the importance of weight control. I encouraged the patient to eat healthy, exercise daily and keep well hydrated, to keep a scheduled bedtime and wake time routine,  to not skip any meals and eat healthy snacks in between meals. I advised the patient not to drive when feeling sleepy. I recommended the following at this time: sleep study with potential positive airway pressure titration. (We will score hypopneas at 4% and split the sleep study into diagnostic and treatment portion, if the estimated. 2 hour AHI is >15/h).   I explained the sleep test procedure to the patient and also outlined possible surgical and non-surgical treatment options of OSA, including the use of a custom-made dental device (which would require a referral to a specialist dentist or oral surgeon), upper airway surgical options, such as pillar implants, radiofrequency surgery, tongue base surgery, and UPPP (which would involve a referral to an ENT surgeon). Rarely, jaw surgery such as mandibular advancement may be considered.  I also explained the CPAP treatment option to the patient, who indicated that he would be willing to try CPAP if the need arises. I explained the importance of being compliant with PAP treatment, not only for insurance purposes but primarily to improve His symptoms, and for the patient's long term health benefit, including to reduce Her cardiovascular risks. I answered all his questions today and the patient was in agreement. I would  like to see him back after the sleep study is completed and encouraged him to call with any interim questions, concerns, problems or updates.   Thank you very much for allowing me to participate in the care of this nice patient. If I can be of any further assistance to you please do not hesitate to call me at [phone].  Sincerely,   [name], MD, PhD

## 2016-02-09 NOTE — Patient Instructions (Addendum)
Based on your symptoms and your exam I believe you are at risk for obstructive sleep apnea or OSA, and I think we should proceed with a sleep study to determine whether you do or do not have OSA and how severe it is. If you have more than mild OSA, I want you to consider treatment with CPAP. Please remember, the risks and ramifications of moderate to severe obstructive sleep apnea or OSA are: Cardiovascular disease, including congestive heart failure, stroke, difficult to control hypertension, arrhythmias, and even type 2 diabetes has been linked to untreated OSA. Sleep apnea causes disruption of sleep and sleep deprivation in most cases, which, in turn, can cause recurrent headaches, problems with memory, mood, concentration, focus, and vigilance. Most people with untreated sleep apnea report excessive daytime sleepiness, which can affect their ability to drive. Please do not drive if you feel sleepy.   I will likely see you back after your sleep study to go over the test results and where to go from there. We will call you after your sleep study to advise about the results (most likely, you will hear from Allen Sosa, my nurse) and to set up an appointment at the time, as necessary.    We can consider surgical referral to ENT in the future. Weight loss is key you as well.  Our sleep lab administrative assistant, [name] will meet with you or call you to schedule your sleep study. If you don't hear back from her by next week please feel free to call her at [phone]. This is her direct line and please leave a message with your phone number to call back if you get the voicemail box. She will call back as soon as possible.

## 2016-03-03 ENCOUNTER — Ambulatory Visit (INDEPENDENT_AMBULATORY_CARE_PROVIDER_SITE_OTHER): Payer: Medicaid Other | Admitting: Neurology

## 2016-03-03 DIAGNOSIS — G479 Sleep disorder, unspecified: Secondary | ICD-10-CM

## 2016-03-03 DIAGNOSIS — G4734 Idiopathic sleep related nonobstructive alveolar hypoventilation: Secondary | ICD-10-CM

## 2016-03-03 DIAGNOSIS — G4733 Obstructive sleep apnea (adult) (pediatric): Secondary | ICD-10-CM

## 2016-03-03 DIAGNOSIS — G4720 Circadian rhythm sleep disorder, unspecified type: Secondary | ICD-10-CM

## 2016-03-03 NOTE — Sleep Study (Signed)
Please see the scanned sleep study interpretation located in the procedure tab within the chart review section.   

## 2016-03-07 ENCOUNTER — Telehealth: Payer: Self-pay | Admitting: Neurology

## 2016-03-07 DIAGNOSIS — G4733 Obstructive sleep apnea (adult) (pediatric): Secondary | ICD-10-CM

## 2016-03-07 DIAGNOSIS — G479 Sleep disorder, unspecified: Secondary | ICD-10-CM

## 2016-03-07 DIAGNOSIS — G4734 Idiopathic sleep related nonobstructive alveolar hypoventilation: Secondary | ICD-10-CM

## 2016-03-07 DIAGNOSIS — G4720 Circadian rhythm sleep disorder, unspecified type: Secondary | ICD-10-CM

## 2016-03-07 NOTE — Telephone Encounter (Signed)
Patient referred by Dr. Regan LemmingGerlach, seen by me on 02/09/16, diagnostic PSG on 03/03/16, ins: MCD.   Please call and notify the patient that the recent sleep study did confirm the diagnosis of Severe obstructive sleep apnea and that I recommend treatment for this in the form of CPAP. This will require a repeat sleep study for proper titration and mask fitting. We did not split his study as he did not sleep enough during this study. Please explain to patient and arrange for a CPAP titration study. I have placed an order in the chart. Thanks, and please route to [name] for scheduling next sleep study.  Huston FoleySaima Aron Inge, MD, PhD [facility])

## 2016-03-07 NOTE — Telephone Encounter (Signed)
I spoke to patient and he is aware of results and recommendations. He is willing to proceed with titration study.  

## 2016-03-07 NOTE — Telephone Encounter (Signed)
LM for patient to call back for results

## 2016-03-22 ENCOUNTER — Encounter (HOSPITAL_COMMUNITY): Payer: Self-pay

## 2016-03-22 ENCOUNTER — Emergency Department (HOSPITAL_COMMUNITY)
Admission: EM | Admit: 2016-03-22 | Discharge: 2016-03-22 | Disposition: A | Discharge status: Home / Self Care | Payer: Medicaid Other | Attending: Emergency Medicine | Admitting: Emergency Medicine

## 2016-03-22 DIAGNOSIS — Z046 Encounter for general psychiatric examination, requested by authority: Secondary | ICD-10-CM | POA: Diagnosis present

## 2016-03-22 DIAGNOSIS — E669 Obesity, unspecified: Secondary | ICD-10-CM | POA: Diagnosis not present

## 2016-03-22 DIAGNOSIS — I10 Essential (primary) hypertension: Secondary | ICD-10-CM | POA: Diagnosis not present

## 2016-03-22 LAB — CBC
HCT: 40 % (ref 39.0–52.0)
Hemoglobin: 13.8 g/dL (ref 13.0–17.0)
MCH: 28.4 pg (ref 26.0–34.0)
MCHC: 34.5 g/dL (ref 30.0–36.0)
MCV: 82.3 fL (ref 78.0–100.0)
PLATELETS: 217 10*3/uL (ref 150–400)
RBC: 4.86 MIL/uL (ref 4.22–5.81)
RDW: 13.9 % (ref 11.5–15.5)
WBC: 5.6 10*3/uL (ref 4.0–10.5)

## 2016-03-22 LAB — COMPREHENSIVE METABOLIC PANEL
ALK PHOS: 91 U/L (ref 38–126)
ALT: 42 U/L (ref 17–63)
AST: 36 U/L (ref 15–41)
Albumin: 4 g/dL (ref 3.5–5.0)
Anion gap: 6 (ref 5–15)
BUN: 10 mg/dL (ref 6–20)
CALCIUM: 9.6 mg/dL (ref 8.9–10.3)
CO2: 27 mmol/L (ref 22–32)
CREATININE: 1.23 mg/dL (ref 0.61–1.24)
Chloride: 102 mmol/L (ref 101–111)
Glucose, Bld: 111 mg/dL — ABNORMAL HIGH (ref 65–99)
Potassium: 4.3 mmol/L (ref 3.5–5.1)
Sodium: 135 mmol/L (ref 135–145)
Total Bilirubin: 0.6 mg/dL (ref 0.3–1.2)
Total Protein: 7.6 g/dL (ref 6.5–8.1)

## 2016-03-22 LAB — SALICYLATE LEVEL

## 2016-03-22 LAB — ACETAMINOPHEN LEVEL: Acetaminophen (Tylenol), Serum: <10 ug/mL — ABNORMAL LOW (ref 10–30)

## 2016-03-22 LAB — ETHANOL

## 2016-03-22 NOTE — ED Notes (Signed)
Pt here for medication reasons.  Pt denies SI/HI.  Pt does not want to stay and feels he can manage at home.  Pt decided to d/c

## 2016-03-22 NOTE — ED Notes (Signed)
Patient states, "I need some help" Patient states, "I am struggling with my Schizoeffective disorder." Patient denies SI/HI. Patient states he did not take his medications this AM. Patient stated that he feels so tired from his medication and does not want to feel that way. Patient denies any alcohol or drug use.

## 2016-03-25 ENCOUNTER — Encounter (HOSPITAL_COMMUNITY): Payer: Self-pay | Admitting: *Deleted

## 2016-03-25 ENCOUNTER — Emergency Department (HOSPITAL_COMMUNITY): Payer: Medicaid Other

## 2016-03-25 ENCOUNTER — Emergency Department (HOSPITAL_COMMUNITY)
Admission: EM | Admit: 2016-03-25 | Discharge: 2016-03-25 | Disposition: A | Discharge status: Home / Self Care | Payer: Medicaid Other | Attending: Physician Assistant | Admitting: Physician Assistant

## 2016-03-25 DIAGNOSIS — F329 Major depressive disorder, single episode, unspecified: Secondary | ICD-10-CM | POA: Diagnosis not present

## 2016-03-25 DIAGNOSIS — M79671 Pain in right foot: Secondary | ICD-10-CM | POA: Insufficient documentation

## 2016-03-25 DIAGNOSIS — Z8669 Personal history of other diseases of the nervous system and sense organs: Secondary | ICD-10-CM | POA: Diagnosis not present

## 2016-03-25 DIAGNOSIS — I10 Essential (primary) hypertension: Secondary | ICD-10-CM | POA: Diagnosis not present

## 2016-03-25 DIAGNOSIS — F250 Schizoaffective disorder, bipolar type: Secondary | ICD-10-CM | POA: Insufficient documentation

## 2016-03-25 DIAGNOSIS — E669 Obesity, unspecified: Secondary | ICD-10-CM | POA: Insufficient documentation

## 2016-03-25 DIAGNOSIS — F419 Anxiety disorder, unspecified: Secondary | ICD-10-CM | POA: Insufficient documentation

## 2016-03-25 DIAGNOSIS — Z79899 Other long term (current) drug therapy: Secondary | ICD-10-CM | POA: Insufficient documentation

## 2016-03-25 NOTE — ED Notes (Signed)
Pt is in stable condition upon d/c and is escorted from ED via wheelchair. 

## 2016-03-25 NOTE — ED Provider Notes (Signed)
CSN: [id number]     Arrival date & time 03/25/16  2023 History  By signing my name below, I, Marisue Humble, attest that this documentation has been prepared under the direction and in the presence of non-physician practitioner, Rhea Bleacher PA-C. Electronically Signed: Marisue Humble, Scribe. 03/25/2016. 8:56 PM.   Chief Complaint  Patient presents with  . Foot Pain   The history is provided by the patient. No language interpreter was used.   HPI Comments:  Allen Sosa is a 35 y.o. male with PMHx of obesity, HTN and HLD who presents to the Emergency Department complaining of intermittent, worsening, moderate-severe right foot pain for the past few weeks, worsening today. Pt reports associated mild swelling to top of right foot and notes pain radiates to calf when putting pressure on foot. No alleviating factors noted. Pt notes he takes Lasix and HCTZ for HTN, and Advil for pain. Pt denies injury, fall, or h/o heart issues.   Past Medical History  Diagnosis Date  . Schizo affective schizophrenia (HCC)   . Sleep apnea   . Anxiety   . Depression   . Obesity   . Hypertension   . Hyperlipemia    Past Surgical History  Procedure Laterality Date  . Vasectomy     Family History  Problem Relation Age of Onset  . Heart attack Mother   . Cancer Mother   . Cancer Sister    Social History  Substance Use Topics  . Smoking status: Never Smoker   . Smokeless tobacco: Never Used  . Alcohol Use: No    Review of Systems  Constitutional: Negative for activity change.  Musculoskeletal: Positive for myalgias and arthralgias (right foot). Negative for back pain, joint swelling, gait problem and neck pain.  Skin: Negative for wound.  Neurological: Negative for weakness and numbness.   Allergies  Review of patient's allergies indicates no known allergies.  Home Medications   Prior to Admission medications   Medication Sig Start Date End Date Taking? Authorizing Provider  clonazePAM  (KLONOPIN) 2 MG tablet Take 2 mg by mouth 3 (three) times daily as needed for anxiety.  09/30/15  Yes Historical Provider, MD  dicyclomine (BENTYL) 20 MG tablet 1 TABLET BY MOUTH THREE TIMES DAILY 02/08/16  Yes Historical Provider, MD  hydrochlorothiazide (HYDRODIURIL) 25 MG tablet Take 25 mg by mouth daily. 02/08/16  Yes Historical Provider, MD  LATUDA 120 MG TABS Take 1 tablet by mouth at bedtime. 02/17/16  Yes Historical Provider, MD  temazepam (RESTORIL) 30 MG capsule Take 30 mg by mouth at bedtime as needed for sleep.  10/05/15  Yes Historical Provider, MD   BP 151/101 mmHg  Pulse 95  Temp(Src) 98.3 F (36.8 C) (Oral)  Resp 24  Ht  (1.854 m)  Wt 441 lb (200.036 kg)  BMI 58.20 kg/m2  SpO2 96%  Physical Exam  Constitutional: He appears well-developed and well-nourished.  HENT:  Head: Normocephalic and atraumatic.  Eyes: Conjunctivae are normal.  Neck: Normal range of motion. Neck supple.  Cardiovascular:  Pulses:      Dorsalis pedis pulses are 2+ on the right side, and 2+ on the left side.  Pulmonary/Chest: No respiratory distress.  Musculoskeletal: He exhibits tenderness.       Right knee: Normal.       Right ankle: Normal. No tenderness.       Right lower leg: Normal.       Right foot: There is tenderness. There is normal range of motion,  no bony tenderness and no swelling.       Feet:  Neurological: He is alert.  Skin: Skin is warm and dry.  Psychiatric: He has a normal mood and affect.  Nursing note and vitals reviewed.   ED Course  Procedures  DIAGNOSTIC STUDIES:  Oxygen Saturation is 96% on RA, adequate by my interpretation.    COORDINATION OF CARE:  8:48 PM Will x-ray right foot. Recommended follow-up with podiatrist or orthopedist. Discussed treatment plan with pt at bedside and pt agreed to plan. Patient has been ambulatory in the department.  Imaging Review Dg Foot Complete Right  03/25/2016  CLINICAL DATA:  Right foot pain for several weeks. No trauma  history submitted. EXAM: RIGHT FOOT COMPLETE - 3+ VIEW COMPARISON:  None. FINDINGS: No acute fracture or dislocation. No radiopaque foreign object. No periosteal reaction or callus deposition. IMPRESSION: No acute osseous abnormality. Electronically Signed   By: Jeronimo GreavesKyle  Talbot M.D.   On: 03/25/2016 22:14   I have personally reviewed and evaluated these images and lab results as part of my medical decision-making.  10:28 PM X-ray results reviewed. Patient counseled. He will use over-the-counter medications for pain and inflammation. Discussed that he should follow-up with PCP or podiatry in 1 week if still having pain. Patient verbalizes understanding and agrees with plan.  MDM   Final diagnoses:  Right foot pain   Patient with pain in the bottom of his right foot. X-rays are negative. No signs of infection. No signs of gouty arthritis. Foot is well-perfused with normal pulses and cap refill. Possible plantar fasciitis.  I personally performed the services described in this documentation, which was scribed in my presence. The recorded information has been reviewed and is accurate.    Renne CriglerJoshua Braedan Meuth, PA-C 03/25/16 2229  Courteney Randall AnLyn Mackuen, MD 03/25/16 2356

## 2016-03-25 NOTE — Discharge Instructions (Signed)
Please read and follow all provided instructions.  Your diagnoses today include:  1. Right foot pain     Tests performed today include:  An x-ray of the affected area - does NOT show any broken bones  Vital signs. See below for your results today.   Medications prescribed:   None  Take any prescribed medications only as directed.  Home care instructions:   Follow any educational materials contained in this packet  Follow R.I.C.E. Protocol:  R - rest your injury   I  - use ice on injury without applying directly to skin  C - compress injury with bandage or splint  E - elevate the injury as much as possible  Follow-up instructions: Please follow-up with your primary care provider if you continue to have significant pain in 1 week.   Return instructions:   Please return if your toes or feet are numb or tingling, appear gray or blue, or you have severe pain (also elevate the leg and loosen splint or wrap if you were given one)  Please return to the Emergency Department if you experience worsening symptoms.   Please return if you have any other emergent concerns.  Additional Information:  Your vital signs today were: BP 151/101 mmHg   Pulse 95   Temp(Src) 98.3 F (36.8 C) (Oral)   Resp 24   Ht 6\' 1"  (1.854 m)   Wt 200.036 kg   BMI 58.20 kg/m2   SpO2 96% If your blood pressure (BP) was elevated above 135/85 this visit, please have this repeated by your doctor within one month. --------------

## 2016-03-25 NOTE — ED Notes (Addendum)
To ED for eval of right foot pain for the past few weeks. Suddenly worse today. No injury noted. No calf pain. States when walking he can't put any pressure on his foot. Pedal pulses palpable.

## 2016-06-25 ENCOUNTER — Emergency Department (HOSPITAL_COMMUNITY)
Admission: EM | Admit: 2016-06-25 | Discharge: 2016-06-25 | Disposition: A | Discharge status: Home / Self Care | Payer: Medicaid Other | Attending: Emergency Medicine | Admitting: Emergency Medicine

## 2016-06-25 ENCOUNTER — Encounter (HOSPITAL_COMMUNITY): Payer: Self-pay | Admitting: Nurse Practitioner

## 2016-06-25 DIAGNOSIS — I10 Essential (primary) hypertension: Secondary | ICD-10-CM | POA: Diagnosis not present

## 2016-06-25 DIAGNOSIS — Z760 Encounter for issue of repeat prescription: Secondary | ICD-10-CM | POA: Diagnosis not present

## 2016-06-25 DIAGNOSIS — E785 Hyperlipidemia, unspecified: Secondary | ICD-10-CM | POA: Diagnosis not present

## 2016-06-25 DIAGNOSIS — F209 Schizophrenia, unspecified: Secondary | ICD-10-CM | POA: Diagnosis not present

## 2016-06-25 DIAGNOSIS — F439 Reaction to severe stress, unspecified: Secondary | ICD-10-CM | POA: Diagnosis present

## 2016-06-25 DIAGNOSIS — Z79899 Other long term (current) drug therapy: Secondary | ICD-10-CM | POA: Insufficient documentation

## 2016-06-25 DIAGNOSIS — F329 Major depressive disorder, single episode, unspecified: Secondary | ICD-10-CM | POA: Diagnosis not present

## 2016-06-25 MED ORDER — LURASIDONE HCL 40 MG PO TABS
120.0000 mg | ORAL_TABLET | Freq: Every day | ORAL | Status: DC
  Administered 2016-06-25: 120 mg via ORAL
  Filled 2016-06-25: qty 3

## 2016-06-25 NOTE — ED Notes (Signed)
Bed: [id number] Expected date:  Expected time:  Means of arrival:  Comments: 2824 M SI

## 2016-06-25 NOTE — ED Notes (Signed)
Spoke w/ OD who has arranged transport via [employer] for pt back to his home.

## 2016-06-25 NOTE — ED Provider Notes (Signed)
CSN: [id number]     Arrival date & time 06/25/16  2137 History   First MD Initiated Contact with Patient 06/25/16 2144     Chief Complaint  Patient presents with  . Upset   . Not taken his Meds      (Consider location/radiation/quality/duration/timing/severity/associated sxs/prior Treatment) HPI Comments: Patient presents to the emergency department with chief complaint of stress. He states that he has been stressed at home, and has been out of his normal psychiatric medications. States that he had the medications refilled, but did not make it to the pharmacy and time to pick them up today. He reportedly became angry, and call 911. He states that when he is off of his medications, he feels more stressed and feels angry. He adamantly denies any suicidal or homicidal ideation. He states that he has calmed down now, and feels that he can go home and get his prescription filled tomorrow. He asks if there is anything that we can give him tonight. Denies any other symptoms.  The history is provided by the patient. No language interpreter was used.    Past Medical History  Diagnosis Date  . Schizo affective schizophrenia (HCC)   . Sleep apnea   . Anxiety   . Depression   . Obesity   . Hypertension   . Hyperlipemia    Past Surgical History  Procedure Laterality Date  . Vasectomy     Family History  Problem Relation Age of Onset  . Heart attack Mother   . Cancer Mother   . Cancer Sister    Social History  Substance Use Topics  . Smoking status: Never Smoker   . Smokeless tobacco: Never Used  . Alcohol Use: No    Review of Systems  All other systems reviewed and are negative.     Allergies  Review of patient's allergies indicates no known allergies.  Home Medications   Prior to Admission medications   Medication Sig Start Date End Date Taking? Authorizing Provider  buPROPion (WELLBUTRIN XL) 300 MG 24 hr tablet Take 300 mg by mouth every morning. 06/19/16  Yes Historical  Provider, MD  clonazePAM (KLONOPIN) 2 MG tablet Take 2 mg by mouth 3 (three) times daily as needed for anxiety.  09/30/15  Yes Historical Provider, MD  hydrochlorothiazide (HYDRODIURIL) 25 MG tablet Take 25 mg by mouth daily. 02/08/16  Yes Historical Provider, MD  ibuprofen (ADVIL,MOTRIN) 200 MG tablet Take 400 mg by mouth every 6 (six) hours as needed (for pain.).   Yes Historical Provider, MD  LATUDA 120 MG TABS Take 1 tablet by mouth at bedtime. 02/17/16  Yes Historical Provider, MD  Multiple Vitamin (MULTIVITAMIN WITH MINERALS) TABS tablet Take 1 tablet by mouth daily. Centrum   Yes Historical Provider, MD  temazepam (RESTORIL) 30 MG capsule Take 30 mg by mouth at bedtime as needed for sleep.  10/05/15  Yes Historical Provider, MD   BP 181/97 mmHg  Pulse 88  Temp(Src) 98.8 F (37.1 C) (Oral)  Resp 18  SpO2 94% Physical Exam  Constitutional: He is oriented to person, place, and time. He appears well-developed and well-nourished.  HENT:  Head: Normocephalic and atraumatic.  Eyes: Conjunctivae and EOM are normal. Pupils are equal, round, and reactive to light. Right eye exhibits no discharge. Left eye exhibits no discharge. No scleral icterus.  Neck: Normal range of motion. Neck supple. No JVD present.  Cardiovascular: Normal rate, regular rhythm and normal heart sounds.  Exam reveals no gallop and no friction  rub.   No murmur heard. Pulmonary/Chest: Effort normal and breath sounds normal. No respiratory distress. He has no wheezes. He has no rales. He exhibits no tenderness.  Abdominal: Soft. He exhibits no distension and no mass. There is no tenderness. There is no rebound and no guarding.  Musculoskeletal: Normal range of motion. He exhibits no edema or tenderness.  Neurological: He is alert and oriented to person, place, and time.  Skin: Skin is warm and dry.  Psychiatric: He has a normal mood and affect. His behavior is normal. Judgment and thought content normal.  Nursing note and  vitals reviewed.   ED Course  Procedures (including critical care time)   MDM   Final diagnoses:  Encounter for medication refill  Stress    Patient states he was under a lot of stress and became upset because he did not get his medications refilled today. He did not make it to the pharmacy and time. He denies suicidal or homicidal ideation to me. He is calm and responding appropriately to questions. Seems to have sufficient capacity to make his own medical decisions. He states that he thinks that he can go home and get his prescriptions filled morning. He asks if I can give him a dose of his medication tonight. I will give him a dose here, discharged home. Return precautions. Patient is stable and ready for discharge.    Roxy Horsemanobert Haakon Titsworth, PA-C 06/25/16 2234  Linwood DibblesJon Knapp, MD 06/25/16 2237

## 2016-06-25 NOTE — ED Notes (Signed)
Pt states he was upset and stressed out. He states he has been stressed out all his life and diagnosed with schizoaffective disorder. Of note, EMS had incoded stating pt was "suicidal" something they reinforced during care hand off report. Pt denies all of these, states he is not suicidal nor homicidal. He adds that he was upset when he called 911, has not been taking his latuda and klonopin. He however "recently got Rx for refills for both, and states that he wants to go and have them refills and will be fine."

## 2016-06-25 NOTE — Discharge Instructions (Signed)
Stress and Stress Management °Stress is a normal reaction to life events. It is what you feel when life demands more than you are used to or more than you can handle. Some stress can be useful. For example, the stress reaction can help you catch the last bus of the day, study for a test, or meet a deadline at work. But stress that occurs too often or for too long can cause problems. It can affect your emotional health and interfere with relationships and normal daily activities. Too much stress can weaken your immune system and increase your risk for physical illness. If you already have a medical problem, stress can make it worse. °CAUSES  °All sorts of life events may cause stress. An event that causes stress for one person may not be stressful for another person. Major life events commonly cause stress. These may be positive or negative. Examples include losing your job, moving into a new home, getting married, having a baby, or losing a loved one. Less obvious life events may also cause stress, especially if they occur day after day or in combination. Examples include working long hours, driving in traffic, caring for children, being in debt, or being in a difficult relationship. °SIGNS AND SYMPTOMS °Stress may cause emotional symptoms including, the following: °· Anxiety. This is feeling worried, afraid, on edge, overwhelmed, or out of control. °· Anger. This is feeling irritated or impatient. °· Depression. This is feeling sad, down, helpless, or guilty. °· Difficulty focusing, remembering, or making decisions. °Stress may cause physical symptoms, including the following:  °· Aches and pains. These may affect your head, neck, back, stomach, or other areas of your body. °· Tight muscles or clenched jaw. °· Low energy or trouble sleeping.  °Stress may cause unhealthy behaviors, including the following:  °· Eating to feel better (overeating) or skipping meals. °· Sleeping too little, too much, or both. °· Working  too much or putting off tasks (procrastination). °· Smoking, drinking alcohol, or using drugs to feel better. °DIAGNOSIS  °Stress is diagnosed through an assessment by your health care provider. Your health care provider will ask questions about your symptoms and any stressful life events. Your health care provider will also ask about your medical history and may order blood tests or other tests. Certain medical conditions and medicine can cause physical symptoms similar to stress.  Mental illness can cause emotional symptoms and unhealthy behaviors similar to stress. Your health care provider may refer you to a mental health professional for further evaluation.  °TREATMENT  °Stress management is the recommended treatment for stress. The goals of stress management are reducing stressful life events and coping with stress in healthy ways.  °Techniques for reducing stressful life events include the following: °· Stress identification. Self-monitor for stress and identify what causes stress for you. These skills may help you to avoid some stressful events. °· Time management. Set your priorities, keep a calendar of events, and learn to say "no." These tools can help you avoid making too many commitments. °Techniques for coping with stress include the following: °· Rethinking the problem. Try to think realistically about stressful events rather than ignoring them or overreacting. Try to find the positives in a stressful situation rather than focusing on the negatives. °· Exercise. Physical exercise can release both physical and emotional tension. The key is to find a form of exercise you enjoy and do it regularly. °· Relaxation techniques. These relax the body and mind. Examples include yoga, meditation, tai chi, biofeedback, deep   breathing, progressive muscle relaxation, listening to music, being out in nature, journaling, and other hobbies. Again, the key is to find one or more that you enjoy and can do  regularly.  Healthy lifestyle. Eat a balanced diet, get plenty of sleep, and do not smoke. Avoid using alcohol or drugs to relax.  Strong support network. Spend time with family, friends, or other people you enjoy being around.Express your feelings and talk things over with someone you trust. Counseling or talktherapy with a mental health professional may be helpful if you are having difficulty managing stress on your own. Medicine is typically not recommended for the treatment of stress.Talk to your health care provider if you think you need medicine for symptoms of stress. HOME CARE INSTRUCTIONS  Keep all follow-up visits as directed by your health care provider.  Take all medicines as directed by your health care provider. SEEK MEDICAL CARE IF:  Your symptoms get worse or you start having new symptoms.  You feel overwhelmed by your problems and can no longer manage them on your own. SEEK IMMEDIATE MEDICAL CARE IF:  You feel like hurting yourself or someone else.   This information is not intended to replace advice given to you by your health care provider. Make sure you discuss any questions you have with your health care provider.   Document Released: 06/06/2001 Document Revised: 01/01/2015 Document Reviewed: 08/05/2013 Elsevier Interactive Patient Education 2016 [place] Refill at the Emergency Department We have refilled your medicine today, but it is best for you to get refills through your primary health care provider's office. In the future, please plan ahead so you do not need to get refills from the emergency department. If the medicine we refilled was a maintenance medicine, you may have received only enough to get you by until you are able to see your regular health care provider.   This information is not intended to replace advice given to you by your health care provider. Make sure you discuss any questions you have with your health care provider.    Document Released: 03/29/2004 Document Revised: 01/01/2015 Document Reviewed: 03/20/2014 Elsevier Interactive Patient Education [employer].

## 2016-08-31 ENCOUNTER — Encounter (HOSPITAL_COMMUNITY): Payer: Self-pay | Admitting: Emergency Medicine

## 2016-08-31 ENCOUNTER — Emergency Department (HOSPITAL_COMMUNITY)
Admission: EM | Admit: 2016-08-31 | Discharge: 2016-09-01 | Disposition: A | Discharge status: Home / Self Care | Payer: Medicaid Other | Attending: Emergency Medicine | Admitting: Emergency Medicine

## 2016-08-31 DIAGNOSIS — I10 Essential (primary) hypertension: Secondary | ICD-10-CM | POA: Insufficient documentation

## 2016-08-31 DIAGNOSIS — R0789 Other chest pain: Secondary | ICD-10-CM | POA: Diagnosis present

## 2016-08-31 NOTE — ED Notes (Signed)
PA at bedside.

## 2016-08-31 NOTE — Discharge Instructions (Signed)
Return here as needed. °

## 2016-08-31 NOTE — ED Notes (Signed)
Pt requesting to leave. RN notified. PA to be notified.

## 2016-08-31 NOTE — ED Triage Notes (Addendum)
Cp and sob that started over the last few days. pain comes and goes states takes HTN meds  But did not take them today did take a klonipin per ems no n/v/or sweating 20 rt hand given 324 asa

## 2016-09-01 NOTE — ED Provider Notes (Signed)
[facility] Provider Note   CSN: [id number] Arrival date & time: 08/31/16  1047     History   Chief Complaint Chief Complaint  Patient presents with  . Chest Pain  . Shortness of Breath    HPI Allen RenMichael Corral V. is a 35 y.o. male.  HPI Patient presents to the emergency department with an anxiety attack that occurred after the fact of the argument with his wife.  The patient states he was arguing with his wife when he started having increased breathing and chest pressure.  Patient states now he feels no symptoms and has calmed down.  He states that he does not want to be seen any further states that he does not have any other symptomsThe patient denies headache,blurred vision, neck pain, fever, cough, weakness, numbness, dizziness, anorexia, edema, abdominal pain, nausea, vomiting, diarrhea, rash, back pain, dysuria, hematemesis, bloody stool, near syncope, or syncope. Past Medical History:  Diagnosis Date  . Anxiety   . Depression   . Hyperlipemia   . Hypertension   . Obesity   . Schizo affective schizophrenia (HCC)   . Sleep apnea     Patient Active Problem List   Diagnosis Date Noted  . Abdominal pain 11/20/2014  . Abdominal pain in male 11/18/2014    Past Surgical History:  Procedure Laterality Date  . VASECTOMY         Home Medications    Prior to Admission medications   Medication Sig Start Date End Date Taking? Authorizing Provider  clonazePAM (KLONOPIN) 2 MG tablet Take 2 mg by mouth 3 (three) times daily as needed for anxiety.  09/30/15  Yes Historical Provider, MD  dicyclomine (BENTYL) 20 MG tablet Take 20 mg by mouth 3 (three) times daily before meals.   Yes Historical Provider, MD  hydrochlorothiazide (HYDRODIURIL) 25 MG tablet Take 25 mg by mouth daily. 02/08/16  Yes Historical Provider, MD  ibuprofen (ADVIL,MOTRIN) 200 MG tablet Take 400 mg by mouth every 6 (six) hours as needed (for pain.).   Yes Historical Provider, MD  LATUDA 120 MG TABS  Take 1 tablet by mouth at bedtime. 02/17/16  Yes Historical Provider, MD  Multiple Vitamin (MULTIVITAMIN WITH MINERALS) TABS tablet Take 1 tablet by mouth daily. Centrum   Yes Historical Provider, MD  temazepam (RESTORIL) 30 MG capsule Take 30 mg by mouth at bedtime as needed for sleep.  10/05/15  Yes Historical Provider, MD  venlafaxine XR (EFFEXOR-XR) 75 MG 24 hr capsule Take 75 mg by mouth daily. 07/21/16  Yes Historical Provider, MD    Family History Family History  Problem Relation Age of Onset  . Heart attack Mother   . Cancer Mother   . Cancer Sister     Social History Social History  Substance Use Topics  . Smoking status: Never Smoker  . Smokeless tobacco: Never Used  . Alcohol use No     Allergies   Review of patient's allergies indicates no known allergies.   Review of Systems Review of Systems  All other systems negative except as documented in the HPI. All pertinent positives and negatives as reviewed in the HPI. Physical Exam Updated Vital Signs BP 143/95   Pulse 79   Temp 98.2 F (36.8 C) (Oral)   Resp 20   SpO2 95%   Physical Exam  Constitutional: He is oriented to person, place, and time. He appears well-developed and well-nourished. No distress.  HENT:  Head: Normocephalic and atraumatic.  Mouth/Throat: Oropharynx is clear and moist.  Eyes: Pupils are equal, round, and reactive to light.  Neck: Normal range of motion. Neck supple.  Cardiovascular: Normal rate, regular rhythm and normal heart sounds.  Exam reveals no gallop and no friction rub.   No murmur heard. Pulmonary/Chest: Effort normal and breath sounds normal. No respiratory distress. He has no wheezes.  Abdominal: Soft. Bowel sounds are normal. He exhibits no distension. There is no tenderness.  Neurological: He is alert and oriented to person, place, and time. He exhibits normal muscle tone. Coordination normal.  Skin: Skin is warm and dry. No rash noted. No erythema.  Psychiatric: He has  a normal mood and affect. He is agitated.  Nursing note and vitals reviewed.    ED Treatments / Results  Labs (all labs ordered are listed, but only abnormal results are displayed) Labs Reviewed - No data to display  EKG  EKG Interpretation  Date/Time:  Thursday August 31 2016 10:56:19 EDT Ventricular Rate:  83 PR Interval:    QRS Duration: 84 QT Interval:  364 QTC Calculation: 428 R Axis:   -17 Text Interpretation:  Sinus rhythm Borderline left axis deviation No significant change since last tracing Confirmed by KNAPP  MD-J, JON ([id number]) on 08/31/2016 11:22:01 AM       Radiology No results found.  Procedures Procedures (including critical care time)  Medications Ordered in ED Medications - No data to display   Initial Impression / Assessment and Plan / ED Course  I have reviewed the triage vital signs and the nursing notes.  Pertinent labs & imaging results that were available during my care of the patient were reviewed by me and considered in my medical decision making (see chart for details).  Clinical Course    Patient like to be discharged.  She states he is feeling no symptoms at this time.  He states that he had an anxiety attack after fight with his wife.  He states that she has come to the emergency department and now he wants to leave.  Patient states that he will follow-up with his primary care doctor  Final Clinical Impressions(s) / ED Diagnoses   Final diagnoses:  Atypical chest pain    New Prescriptions Discharge Medication List as of 08/31/2016 11:50 AM       Charlestine Night, PA-C 09/01/16 1733    Linwood Dibbles, MD 09/03/16 [phone]

## 2016-10-28 ENCOUNTER — Emergency Department (HOSPITAL_COMMUNITY): Payer: Medicaid Other

## 2016-10-28 ENCOUNTER — Encounter (HOSPITAL_COMMUNITY): Payer: Self-pay | Admitting: Emergency Medicine

## 2016-10-28 ENCOUNTER — Emergency Department (HOSPITAL_COMMUNITY)
Admission: EM | Admit: 2016-10-28 | Discharge: 2016-10-28 | Disposition: A | Discharge status: Home / Self Care | Payer: Medicaid Other | Attending: Emergency Medicine | Admitting: Emergency Medicine

## 2016-10-28 DIAGNOSIS — R0602 Shortness of breath: Secondary | ICD-10-CM | POA: Insufficient documentation

## 2016-10-28 DIAGNOSIS — I10 Essential (primary) hypertension: Secondary | ICD-10-CM | POA: Insufficient documentation

## 2016-10-28 DIAGNOSIS — Z5900 Homelessness unspecified: Secondary | ICD-10-CM

## 2016-10-28 DIAGNOSIS — R51 Headache: Secondary | ICD-10-CM | POA: Insufficient documentation

## 2016-10-28 DIAGNOSIS — R519 Headache, unspecified: Secondary | ICD-10-CM

## 2016-10-28 MED ORDER — ACETAMINOPHEN 325 MG PO TABS
650.0000 mg | ORAL_TABLET | Freq: Once | ORAL | Status: AC
  Administered 2016-10-28: 650 mg via ORAL
  Filled 2016-10-28: qty 2

## 2016-10-28 MED ORDER — ALBUTEROL SULFATE HFA 108 (90 BASE) MCG/ACT IN AERS
2.0000 | INHALATION_SPRAY | RESPIRATORY_TRACT | Status: DC | PRN
  Administered 2016-10-28: 2 via RESPIRATORY_TRACT
  Filled 2016-10-28: qty 6.7

## 2016-10-28 NOTE — ED Notes (Signed)
Pt on the phone, He states he will call the nurse when he gets off the phone to ambulate with pulse ox. Pt's phone call is related to his plans for time of dc

## 2016-10-28 NOTE — ED Notes (Signed)
CSW at bedside.

## 2016-10-28 NOTE — ED Provider Notes (Signed)
[facility] Provider Note   CSN: [id number] Arrival date & time: 10/28/16  1259     History   Chief Complaint No chief complaint on file.   HPI Allen Sosa is a 35 y.o. male.  HPI   35 year old obese male with history of schizoaffective schizophrenia, anxiety, depression, hypertension presenting with complaint of shortness of breath. Patient states he has this history of sleep apnea but does not have a sleep apnea machine for the past 3 years since he moved down from [place]. Due to lack of control of his sleep apnea he has persistent generalized fatigue, chronic headache, chronic shortness of breath. Patient states he has not been taking care of his health as he should, doesn't have his medication because he doesn't have a primary care provider, and today he is officially homeless when his wife refused to let him return to his home. 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 States that is the reason why his wife doesn't want to be with him anymore. He requests to talk to social worker to get help for his new homelessness status. He also complaining of chronic throbbing headache throughout his head which has been progressively worse within the past several months. Also reported persistent shortness of breath which is not new. No report of fever, productive cough, hemoptysis, neck stiffness, rash, or confusion. No prior history of PE or DVT.  Past Medical History:  Diagnosis Date  . Anxiety   . Depression   . Hyperlipemia   . Hypertension   . Obesity   . Schizo affective schizophrenia (HCC)   . Sleep apnea     Patient Active Problem List   Diagnosis Date Noted  . Abdominal pain 11/20/2014  . Abdominal pain in male 11/18/2014    Past Surgical History:  Procedure Laterality Date  . VASECTOMY         Home Medications    Prior to Admission medications   Medication Sig Start Date End Date  Taking? Authorizing Provider  clonazePAM (KLONOPIN) 2 MG tablet Take 2 mg by mouth 3 (three) times daily as needed for anxiety.  09/30/15  Yes Historical Provider, MD  ibuprofen (ADVIL,MOTRIN) 200 MG tablet Take 600 mg by mouth every 4 (four) hours as needed for headache (for pain.).    Yes Historical Provider, MD  dicyclomine (BENTYL) 20 MG tablet Take 20 mg by mouth 3 (three) times daily before meals.    Historical Provider, MD  hydrochlorothiazide (HYDRODIURIL) 25 MG tablet Take 25 mg by mouth daily. 02/08/16   Historical Provider, MD  LATUDA 120 MG TABS Take 1 tablet by mouth at bedtime. 02/17/16   Historical Provider, MD  Multiple Vitamin (MULTIVITAMIN WITH MINERALS) TABS tablet Take 1 tablet by mouth daily. Centrum    Historical Provider, MD  temazepam (RESTORIL) 30 MG capsule Take 30 mg by mouth at bedtime as needed for sleep.  10/05/15   Historical Provider, MD  venlafaxine XR (EFFEXOR-XR) 75 MG 24 hr capsule Take 75 mg by mouth daily. 07/21/16   Historical Provider, MD    Family History Family History  Problem Relation Age of Onset  . Heart attack Mother   . Cancer Mother   . Cancer Sister     Social History Social History  Substance Use Topics  . Smoking status: Never Smoker  . Smokeless tobacco: Never Used  . Alcohol use No     Allergies  Review of patient's allergies indicates no known allergies.   Review of Systems Review of Systems  All other systems reviewed and are negative.    Physical Exam Updated Vital Signs There were no vitals taken for this visit.  Physical Exam  Constitutional: He is oriented to person, place, and time. He appears well-developed and well-nourished. No distress.  Morbidly obese male laying in bed in no acute discomfort, nontoxic  HENT:  Head: Atraumatic.  Mouth/Throat: Oropharynx is clear and moist.  Eyes: Conjunctivae are normal.  Neck: Neck supple. No tracheal deviation present.  No nuchal rigidity  Cardiovascular: Normal rate  and regular rhythm.   Pulmonary/Chest: Effort normal and breath sounds normal. No stridor. No respiratory distress. He has no wheezes. He has no rales. He exhibits no tenderness.  Abdominal: Soft. There is no tenderness.  Neurological: He is alert and oriented to person, place, and time.  Skin: No rash noted.  Psychiatric: He has a normal mood and affect.  Nursing note and vitals reviewed.    ED Treatments / Results  Labs (all labs ordered are listed, but only abnormal results are displayed) Labs Reviewed - No data to display  EKG  EKG Interpretation None       Radiology Dg Chest 2 View  Result Date: 10/28/2016 CLINICAL DATA:  Shortness of breath.  Hypertension. EXAM: CHEST  2 VIEW COMPARISON:  12/02/2015 FINDINGS: Lungs are clear without airspace disease or pulmonary edema. Heart and mediastinum are within normal limits. The trachea is midline. Negative for a pneumothorax. No acute bone abnormality. No significant pleural effusions. IMPRESSION: No active cardiopulmonary disease. Electronically Signed   By: Richarda OverlieAdam  Henn M.D.   On: 10/28/2016 13:56    Procedures Procedures (including critical care time)  Medications Ordered in ED Medications - No data to display   Initial Impression / Assessment and Plan / ED Course  I have reviewed the triage vital signs and the nursing notes.  Pertinent labs & imaging results that were available during my care of the patient were reviewed by me and considered in my medical decision making (see chart for details).  Clinical Course    BP 174/93 (BP Location: Right Arm)   Pulse 78   Temp 99 F (37.2 C) (Oral)   Resp 13   Ht 6\' 3"  (1.905 m)   Wt (!) 199.6 kg   SpO2 96%   BMI 55.00 kg/m    Final Clinical Impressions(s) / ED Diagnoses   Final diagnoses:  Bad headache  Shortness of breath  Homelessness    New Prescriptions New Prescriptions   No medications on file   1:47 PM Patient completed of headache and shortness of  breath. This is chronic in nature. He does not appears to be in any acute respiratory distress. No red flags. No fever or nuchal rigidity to suggest meningitis, no sudden onset of headache to suggest subarachnoid hemorrhage, no focal neuro deficit to suggest stroke. I suspect his symptoms likely secondary to sleep apnea is not well controlled. I will perform a screening chest x-ray, have patient ambulate and check his oxygen status. His primary concern is to talk to his social worker in order to seek for help with this new predicament of being homeless after his wife refused to have him return home.  3:01 PM Chest x-ray showed without any acute abnormality. Patient able to ambulate while maintaining adequate oxygenation and no hypoxia. I have consult with social worker who has been reaching out to different homeless shelters  and currently awaits call back from potential facility.    Fayrene HelperBowie Marcquis Ridlon, PA-C 10/28/16 1515    Fayrene HelperBowie Edder Bellanca, PA-C 10/28/16 1517    Heide Scaleshristopher J Tegeler, MD 10/28/16 [phone]

## 2016-10-28 NOTE — ED Triage Notes (Signed)
Per EMS, [facility] from [facility], c/o SOB x1 day. Denies chest pain and cough. Lung sounds clear. Pt speaking in full sentences with no apparent distress.

## 2016-10-28 NOTE — ED Notes (Signed)
Bed: WA21 Expected date:  Expected time:  Means of arrival:  Comments: 35 yo not feeling well

## 2016-10-28 NOTE — Progress Notes (Signed)
CSW received phone call from ED secretary. [employer] operational officersecretary informed CSW that a consult had been placed for patient regarding homelessness. CSW spoke with patient at bedside. Patient reported that his wife recently kicked him out the house due to inability to get employment because of medical issues. CSW inquired about patient's income. Patient reported that he received disability. CSW called the following shelters that reported being full: [facility], [facility] and [facility]. CSW contacted [employer] and left message requesting return phone call to identify bed availability. CSW provided patient with two handouts with contact information about local shelters and transitional housing. CSW informed patient that once CSW received a return phone call from allied churches that CSW would notify patient of the bed availability. CSW received call from patient's MD regarding consult, CSW informed MD about phone calls placed and waiting to receive return phone call from [employer]. CSW received a phone call from patient's RN, Patient's RN notified CSW that patient requested to speak with CSW again.   CSW spoke with patient at bedside. Patient reported that he wanted to call a friend that he could possibly rent a room from. Patient called friend, friend reported that he has to speak with someone prior to agreeing to let patient stay. Patient's friend agreed to return phone call and notify patient of the decision made. CSW reported this information to patient's MD, MD thanked CSW.

## 2016-10-28 NOTE — ED Notes (Signed)
Patient requesting to speak with Child psychotherapistsocial worker. CSW made aware.

## 2016-10-28 NOTE — ED Notes (Signed)
Per MD order Pulse/02 measurement while ambulating. Pulse range from 74-99 bpm, O2 range from 92%-98% RA. Pt c/o lightheaded, nausea <30 seconds after initiating ambulation. Pt provided chair to rest; began ambulation after resting in chair approx. Arm and wheelchair assistance provided to prevent fall. Pt assisted back to room and bed, RN advised. ENM

## 2017-01-01 ENCOUNTER — Encounter (HOSPITAL_COMMUNITY): Payer: Self-pay | Admitting: *Deleted

## 2017-01-01 ENCOUNTER — Emergency Department (HOSPITAL_COMMUNITY)
Admission: EM | Admit: 2017-01-01 | Discharge: 2017-01-01 | Disposition: A | Discharge status: Home / Self Care | Payer: Medicaid Other | Attending: Emergency Medicine | Admitting: Emergency Medicine

## 2017-01-01 DIAGNOSIS — Z79899 Other long term (current) drug therapy: Secondary | ICD-10-CM

## 2017-01-01 DIAGNOSIS — F4325 Adjustment disorder with mixed disturbance of emotions and conduct: Secondary | ICD-10-CM | POA: Diagnosis present

## 2017-01-01 DIAGNOSIS — Z808 Family history of malignant neoplasm of other organs or systems: Secondary | ICD-10-CM | POA: Diagnosis not present

## 2017-01-01 DIAGNOSIS — F329 Major depressive disorder, single episode, unspecified: Secondary | ICD-10-CM | POA: Diagnosis not present

## 2017-01-01 DIAGNOSIS — R45851 Suicidal ideations: Secondary | ICD-10-CM | POA: Insufficient documentation

## 2017-01-01 DIAGNOSIS — I10 Essential (primary) hypertension: Secondary | ICD-10-CM | POA: Insufficient documentation

## 2017-01-01 DIAGNOSIS — F32A Depression, unspecified: Secondary | ICD-10-CM

## 2017-01-01 DIAGNOSIS — Z8249 Family history of ischemic heart disease and other diseases of the circulatory system: Secondary | ICD-10-CM | POA: Diagnosis not present

## 2017-01-01 DIAGNOSIS — R109 Unspecified abdominal pain: Secondary | ICD-10-CM | POA: Diagnosis not present

## 2017-01-01 LAB — ETHANOL: Alcohol, Ethyl (B): <5 mg/dL (ref <5)

## 2017-01-01 LAB — COMPREHENSIVE METABOLIC PANEL
ALK PHOS: 97 U/L (ref 38–126)
ALT: 32 U/L (ref 17–63)
ANION GAP: 10 (ref 5–15)
AST: 45 U/L — ABNORMAL HIGH (ref 15–41)
Albumin: 3.8 g/dL (ref 3.5–5.0)
BILIRUBIN TOTAL: 0.6 mg/dL (ref 0.3–1.2)
BUN: 9 mg/dL (ref 6–20)
CALCIUM: 9 mg/dL (ref 8.9–10.3)
CO2: 23 mmol/L (ref 22–32)
CREATININE: 1.22 mg/dL (ref 0.61–1.24)
Chloride: 103 mmol/L (ref 101–111)
GFR calc non Af Amer: >60 mL/min (ref >60)
GLUCOSE: 157 mg/dL — AB (ref 65–99)
Potassium: 3.9 mmol/L (ref 3.5–5.1)
Sodium: 136 mmol/L (ref 135–145)
TOTAL PROTEIN: 7.8 g/dL (ref 6.5–8.1)

## 2017-01-01 LAB — CBC
HCT: 36.4 % — ABNORMAL LOW (ref 39.0–52.0)
Hemoglobin: 12.3 g/dL — ABNORMAL LOW (ref 13.0–17.0)
MCH: 28.2 pg (ref 26.0–34.0)
MCHC: 33.8 g/dL (ref 30.0–36.0)
MCV: 83.5 fL (ref 78.0–100.0)
Platelets: 192 10*3/uL (ref 150–400)
RBC: 4.36 MIL/uL (ref 4.22–5.81)
RDW: 13.8 % (ref 11.5–15.5)
WBC: 6.7 10*3/uL (ref 4.0–10.5)

## 2017-01-01 LAB — SALICYLATE LEVEL: Salicylate Lvl: <7 mg/dL (ref 2.8–30.0)

## 2017-01-01 LAB — ACETAMINOPHEN LEVEL: Acetaminophen (Tylenol), Serum: <10 ug/mL — ABNORMAL LOW (ref 10–30)

## 2017-01-01 MED ORDER — DIVALPROEX SODIUM ER 500 MG PO TB24
500.0000 mg | ORAL_TABLET | Freq: Two times a day (BID) | ORAL | Status: DC
  Administered 2017-01-01: 500 mg via ORAL
  Filled 2017-01-01: qty 1

## 2017-01-01 MED ORDER — LURASIDONE HCL 20 MG PO TABS
60.0000 mg | ORAL_TABLET | Freq: Every day | ORAL | Status: DC
  Filled 2017-01-01: qty 3

## 2017-01-01 MED ORDER — ACETAMINOPHEN 325 MG PO TABS: 650.0000 mg | ORAL_TABLET | ORAL | Status: DC | PRN

## 2017-01-01 MED ORDER — DICYCLOMINE HCL 20 MG PO TABS: 20.0000 mg | ORAL_TABLET | Freq: Every day | ORAL | Status: DC

## 2017-01-01 MED ORDER — PANTOPRAZOLE SODIUM 40 MG PO TBEC: 40.0000 mg | DELAYED_RELEASE_TABLET | Freq: Every day | ORAL | Status: DC

## 2017-01-01 MED ORDER — ALUM & MAG HYDROXIDE-SIMETH 200-200-20 MG/5ML PO SUSP: 30.0000 mL | ORAL | Status: DC | PRN

## 2017-01-01 MED ORDER — ZOLPIDEM TARTRATE 5 MG PO TABS: 5.0000 mg | ORAL_TABLET | Freq: Every evening | ORAL | Status: DC | PRN

## 2017-01-01 MED ORDER — IBUPROFEN 200 MG PO TABS: 600.0000 mg | ORAL_TABLET | Freq: Three times a day (TID) | ORAL | Status: DC | PRN

## 2017-01-01 MED ORDER — CLONAZEPAM 1 MG PO TABS: 2.0000 mg | ORAL_TABLET | Freq: Three times a day (TID) | ORAL | Status: DC | PRN

## 2017-01-01 MED ORDER — ONDANSETRON HCL 4 MG PO TABS: 4.0000 mg | ORAL_TABLET | Freq: Three times a day (TID) | ORAL | Status: DC | PRN

## 2017-01-01 NOTE — ED Triage Notes (Signed)
Pt brought in by [employer] under voluntary status, who report pt called out because of his schizophrenia and he is feeling suicidal. The pt reports him and his wife are having problems and he does not want to live anymore. He says he was going to overdose on all of his medications.Repetatively during triage, the pt says that he is "going insane". He says he is taking his medications as prescribed.

## 2017-01-01 NOTE — ED Notes (Signed)
Attempted to change pt into maroon scrubs, but scrubs did not fit; pt changed into bariatric gown

## 2017-01-01 NOTE — ED Provider Notes (Signed)
[facility] DEPT Provider Note   CSN: [id number] Arrival date & time: 01/01/17  0205   By signing my name below, I, Clarisse Gouge, attest that this documentation has been prepared under the direction and in the presence of Gilda Crease, MD. Electronically signed, Clarisse Gouge, ED Scribe. 01/01/17. 3:07 AM.   History   Chief Complaint Chief Complaint  Patient presents with  . Suicidal   The history is provided by the patient and medical records. No language interpreter was used.    HPI Comments: Allen Sosa is a 36 y.o. male BIB EMS who presents to the Emergency Department complaining of suicidal ideations this evening. States he attempted to overdose on all of his medications this evening, and he does not want to live anymore because he is going through problems with wife at home. He discloses that he hears voices telling him to hurt himself. Further notes he is regularly taking his medications at home as prescribed. No other symptoms. Noted at this time.  Past Medical History:  Diagnosis Date  . Anxiety   . Depression   . Hyperlipemia   . Hypertension   . Obesity   . Schizo affective schizophrenia (HCC)   . Sleep apnea     Patient Active Problem List   Diagnosis Date Noted  . Abdominal pain 11/20/2014  . Abdominal pain in male 11/18/2014    Past Surgical History:  Procedure Laterality Date  . VASECTOMY         Home Medications    Prior to Admission medications   Medication Sig Start Date End Date Taking? Authorizing Provider  clonazePAM (KLONOPIN) 2 MG tablet Take 2 mg by mouth 3 (three) times daily as needed for anxiety.  09/30/15  Yes Historical Provider, MD  dicyclomine (BENTYL) 20 MG tablet Take 20 mg by mouth daily.    Yes Historical Provider, MD  divalproex (DEPAKOTE ER) 500 MG 24 hr tablet Take 500 mg by mouth 2 (two) times daily. 12/15/16  Yes Historical Provider, MD  ibuprofen (ADVIL,MOTRIN) 200 MG tablet Take 600 mg by mouth every 4  (four) hours as needed for headache (for pain.).    Yes Historical Provider, MD  LATUDA 60 MG TABS Take 60 mg by mouth daily. 11/15/16  Yes Historical Provider, MD  omeprazole (PRILOSEC) 20 MG capsule Take 40 mg by mouth 2 (two) times daily.    Yes Historical Provider, MD  Paliperidone Palmitate (INVEGA SUSTENNA IM) Inject into the muscle every 3 (three) months. Patient receives at doctors office and does not know mg   Yes Historical Provider, MD    Family History Family History  Problem Relation Age of Onset  . Heart attack Mother   . Cancer Mother   . Cancer Sister     Social History Social History  Substance Use Topics  . Smoking status: Never Smoker  . Smokeless tobacco: Never Used  . Alcohol use No     Allergies   Patient has no known allergies.   Review of Systems Review of Systems  All other systems reviewed and are negative.    Physical Exam Updated Vital Signs BP (!) 175/104 (BP Location: Left Arm)   Pulse 101   Temp 98.2 F (36.8 C) (Oral)   Resp 20   SpO2 96%   Physical Exam  Constitutional: He is oriented to person, place, and time. He appears well-developed and well-nourished. No distress.  HENT:  Head: Normocephalic and atraumatic.  Right Ear: Hearing normal.  Left Ear:  Hearing normal.  Nose: Nose normal.  Mouth/Throat: Oropharynx is clear and moist and mucous membranes are normal.  Eyes: Conjunctivae and EOM are normal. Pupils are equal, round, and reactive to light.  Neck: Normal range of motion. Neck supple.  Cardiovascular: Regular rhythm, S1 normal and S2 normal.  Exam reveals no gallop and no friction rub.   No murmur heard. Pulmonary/Chest: Effort normal and breath sounds normal. No respiratory distress. He exhibits no tenderness.  Abdominal: Soft. Normal appearance and bowel sounds are normal. There is no hepatosplenomegaly. There is no tenderness. There is no rebound, no guarding, no tenderness at McBurney's point and negative Murphy's  sign. No hernia.  Musculoskeletal: Normal range of motion.  Neurological: He is alert and oriented to person, place, and time. He has normal strength. No cranial nerve deficit or sensory deficit. Coordination normal. GCS eye subscore is 4. GCS verbal subscore is 5. GCS motor subscore is 6.  Skin: Skin is warm, dry and intact. No rash noted. No cyanosis.  Psychiatric: His speech is normal. He is slowed and withdrawn. He exhibits a depressed mood. He expresses suicidal ideation.  Nursing note and vitals reviewed.    ED Treatments / Results  DIAGNOSTIC STUDIES: Oxygen Saturation is 96% on RA, adequate by my interpretation.    COORDINATION OF CARE: 3:07 AM Discussed treatment plan with pt at bedside and pt agreed to plan.  Labs (all labs ordered are listed, but only abnormal results are displayed) Labs Reviewed  COMPREHENSIVE METABOLIC PANEL - Abnormal; Notable for the following:       Result Value   Glucose, Bld 157 (*)    AST 45 (*)    All other components within normal limits  ACETAMINOPHEN LEVEL - Abnormal; Notable for the following:    Acetaminophen (Tylenol), Serum <10 (*)    All other components within normal limits  CBC - Abnormal; Notable for the following:    Hemoglobin 12.3 (*)    HCT 36.4 (*)    All other components within normal limits  ETHANOL  SALICYLATE LEVEL  RAPID URINE DRUG SCREEN, HOSP PERFORMED    EKG  EKG Interpretation None       Radiology No results found.  Procedures Procedures (including critical care time)  Medications Ordered in ED Medications  ondansetron (ZOFRAN) tablet 4 mg (not administered)  alum & mag hydroxide-simeth (MAALOX/MYLANTA) 200-200-20 MG/5ML suspension 30 mL (not administered)  zolpidem (AMBIEN) tablet 5 mg (not administered)  ibuprofen (ADVIL,MOTRIN) tablet 600 mg (not administered)  acetaminophen (TYLENOL) tablet 650 mg (not administered)  clonazePAM (KLONOPIN) tablet 2 mg (not administered)  dicyclomine (BENTYL)  tablet 20 mg (not administered)  divalproex (DEPAKOTE ER) 24 hr tablet 500 mg (not administered)  lurasidone (LATUDA) tablet 60 mg (not administered)  pantoprazole (PROTONIX) EC tablet 40 mg (not administered)     Initial Impression / Assessment and Plan / ED Course  I have reviewed the triage vital signs and the nursing notes.  Pertinent labs & imaging results that were available during my care of the patient were reviewed by me and considered in my medical decision making (see chart for details).  Clinical Course   Patient presents voluntarily with concerns over increasing depression and suicidal ideation. Patient does have a history of schizoaffective schizophrenia. He has been taking his medications as prescribed. He reports that he has been expressing increased stress with his wife. He has been hearing voices that are giving him commands to harm himself. He has been considering overdosing as a  suicide attempt. Patient medically clear for psychiatric treatment.  Final Clinical Impressions(s) / ED Diagnoses   Final diagnoses:  Suicidal ideation  Depression, unspecified depression type    New Prescriptions New Prescriptions   No medications on file  I personally performed the services described in this documentation, which was scribed in my presence. The recorded information has been reviewed and is accurate.    Gilda Creasehristopher J Valyn Latchford, MD 01/01/17 [phone]

## 2017-01-01 NOTE — BH Assessment (Addendum)
Tele Assessment Note   Allen RenMichael Tomeo V. is an 36 y.o. male, who [facility] voluntarily and [facility]ac[facility]. Pt reported, having suicidal and homicidal thoughts for the past day. Pt reported, the voices are demanding him to kill his wife and himself. Pt reported, he has been hearing voices for a long time. Pt reported, his voices have conversations however lately they have been demanding. Pt reported, his wife belittles him by saying, "you need a job." Pt asked, "are you my Child psychotherapistsocial worker, because I need a place to stay because I don't want to hurt her you know." Pt reported, needing assistance with preparing meals. Pt reported, had two previous suicide attempts by slitting his wrist in 2001 and 2004. Pt reports, experiencing the following depressive/aniety symptoms: nervousness, sadness/low mood, tearfulness, excessive guilt, feeling hopeless/worthless, decrease appetite (pt reported, he forgets to eat at times unless someone says something), irritability.   Pt denied experiencing verbal, physical and sexual abuse. Pt denied substance usage. Pt reported, a previous inpatient admission while in [place]. Pt reported, he was being followed by Dr, Omelia BlackwaterHeaden for medication management. Pt reported, he is complaint with medications.   Pt presented alert in scrubs with logical/cohernt speech. Pt's eye contact was good. Pt's mood was depressed/anxious. Pt's affect was appropriate to circumstance. Pt's judgement was unimpaired. Pt's concentration, insight and impulse control was good. Pt was oriented x4 (date, year, city, and state.) Pt reported, [facility] he could not contract for safety. Pt reported, if inpatient treatment was recommended he will sign in voluntarily.   Diagnosis: Schizoaffective Disorder [facility](HCC)  Past Medical History:  Past Medical History:  Diagnosis Date  . Anxiety   . Depression   . Hyperlipemia   . Hypertension   . Obesity   . Schizo affective schizophrenia (HCC)   . Sleep  apnea     Past Surgical History:  Procedure Laterality Date  . VASECTOMY      Family History:  Family History  Problem Relation Age of Onset  . Heart attack Mother   . Cancer Mother   . Cancer Sister     Social History:  reports that he has never smoked. He has never used smokeless tobacco. He reports that he does not drink alcohol or use drugs.  Additional Social History:  Alcohol / Drug Use Pain Medications: See MAR  Prescriptions: See MAR Over the Counter: See MAR History of alcohol / drug use?: No history of alcohol / drug abuse  CIWA: CIWA-Ar BP: (!) 175/104 Pulse Rate: 101 COWS:    PATIENT STRENGTHS: (choose at least two) Average or above average intelligence Motivation for treatment/growth  Allergies: No Known Allergies  Home Medications:  (Not in a hospital admission)  OB/GYN Status:  No LMP for male patient.  General Assessment Data Location of Assessment: [facility] ED TTS Assessment: In system Is this a Tele or Face-to-Face Assessment?: Face-to-Face Is this an Initial Assessment or a Re-assessment for this encounter?: Initial Assessment Marital status: Married AxisMaiden name: NA Is patient pregnant?: No Pregnancy Status: No Living Arrangements: Spouse/significant other Can pt return to current living arrangement?: No Admission Status: Voluntary Is patient capable of signing voluntary admission?: Yes Referral Source: Self/Family/Friend Insurance type: Medicaid     Crisis Care Plan Living Arrangements: Spouse/significant other Legal Guardian: Other: (Self) Name of Psychiatrist: Dr. Omelia BlackwaterHeaden Name of Therapist: NA  Education Status Is patient currently in school?: No Current Grade: Na Highest grade of school patient has completed: 11th grade Name of school: NA  Contact person: NA  Risk to self with the past 6 months Suicidal Ideation: Yes-Currently Present Has patient been a risk to self within the past 6 months prior to admission? : No Suicidal  Intent: No Has patient had any suicidal intent within the past 6 months prior to admission? : No Is patient at risk for suicide?: Yes Suicidal Plan?: No Has patient had any suicidal plan within the past 6 months prior to admission? : No Access to Means: No What has been your use of drugs/alcohol within the last 12 months?: Pt denies.  Previous Attempts/Gestures: Yes How many times?: 2 Other Self Harm Risks: Pt denies.  Triggers for Past Attempts: Spouse contact Intentional Self Injurious Behavior: Cutting Comment - Self Injurious Behavior: Pt reported, slitting  his wrist, in 2001 and 2004 as a suicide attempt.  Family Suicide History: No Recent stressful life event(s): Other (Comment), Conflict (Comment) (Pt reported, not working and his wife belitting him. ) Persecutory voices/beliefs?: No Depression: Yes Depression Symptoms: Feeling worthless/self pity, Feeling angry/irritable, Guilt, Isolating, Tearfulness, Loss of interest in usual pleasures Substance abuse history and/or treatment for substance abuse?: No Suicide prevention information given to non-admitted patients: Not applicable  Risk to Others within the past 6 months Homicidal Ideation: Yes-Currently Present Does patient have any lifetime risk of violence toward others beyond the six months prior to admission? : No Thoughts of Harm to Others: No Current Homicidal Intent: No Current Homicidal Plan: No Access to Homicidal Means: No Identified Victim: Pt's wife.  History of harm to others?: No Assessment of Violence: None Noted Violent Behavior Description: NA Does patient have access to weapons?: No (Pt denies. ) Criminal Charges Pending?: No Does patient have a court date: No Is patient on probation?: No  Psychosis Hallucinations: Auditory Delusions: None noted  Mental Status Report Appearance/Hygiene: In scrubs Eye Contact: Good Motor Activity: Unremarkable Speech: Logical/coherent Level of Consciousness:  Alert Mood: Depressed, Anxious Affect: Apathetic, Depressed Anxiety Level: Moderate Thought Processes: Coherent, Relevant Judgement: Unimpaired Orientation: Other (Comment) (date, year, city and state. ) Obsessive Compulsive Thoughts/Behaviors: None  Cognitive Functioning Concentration: Good Memory: Recent Intact IQ: Average Insight: Good Impulse Control: Good Appetite: Poor Weight Loss:  (Pt reported, weight is up and down.) Weight Gain:  (Pt reported, weight is up and down.) Sleep: Decreased Total Hours of Sleep:  (Pt reported, he gets not much sleep. ) Vegetative Symptoms: None  ADLScreening [facility] Assessment Services) Patient's cognitive ability adequate to safely complete daily activities?: Yes Patient able to express need for assistance with ADLs?: Yes Independently performs ADLs?: Yes (appropriate for developmental age)  Prior Inpatient Therapy Prior Inpatient Therapy: Yes Prior Therapy Dates: Unknown Prior Therapy Facilty/Provider(s): Pt reported a facility in [place]. Reason for Treatment: Schizophrenia.   Prior Outpatient Therapy Prior Outpatient Therapy: Yes Prior Therapy Dates: Current Prior Therapy Facilty/Provider(s): Dr. Omelia Blackwater Reason for Treatment: Medication management.  Does patient have an ACCT team?: No Does patient have Intensive In-House Services?  : No Does patient have [facility] services? : No Does patient have P4CC services?: No  ADL Screening (condition at time of admission) Patient's cognitive ability adequate to safely complete daily activities?: Yes Is the patient deaf or have difficulty hearing?: No Does the patient have difficulty seeing, even when wearing glasses/contacts?: Yes (Pt reported, needing glasses. ) Does the patient have difficulty concentrating, remembering, or making decisions?: Yes (Pt reported, difficulty concentrating. ) Patient able to express need for assistance with ADLs?: Yes Does the patient have difficulty dressing or  bathing?:  No Independently performs ADLs?: Yes (appropriate for developmental age) Does the patient have difficulty walking or climbing stairs?: No Weakness of Legs: None Weakness of Arms/Hands: None       Abuse/Neglect Assessment (Assessment to be complete while patient is alone) Physical Abuse: Denies (Pt denies. ) Verbal Abuse: Denies (Pt denies. ) Sexual Abuse: Denies (Pt denies. )     Advance Directives (For Healthcare) Does Patient Have a Medical Advance Directive?: No Would patient like information on creating a medical advance directive?: Yes (ED - Information included in AVS)    Additional Information 1:1 In Past 12 Months?: No CIRT Risk: No Elopement Risk: No Does patient have medical clearance?: Yes     Disposition: Nira Conn, NP recommends inpatient treatment. Per Chyrl [place]atte, [facility] no appropriate beds available. Disposition discussed with Dr. Blinda Leatherwood and Leta Jungling, RN.  Disposition Initial Assessment Completed for this Encounter: Yes Disposition of Patient: Other dispositions (Pending NP review. ) Other disposition(s): Other (Comment) (Peding NP review.)  Gwinda Passe 01/01/2017 5:22 AM   Gwinda Passe, MS, [facility], [facility] Triage Specialist [phone]

## 2017-01-01 NOTE — ED Notes (Signed)
Pt calm, polite, and cooperative at this time.

## 2017-01-01 NOTE — ED Notes (Signed)
Belongings placed in locker 28 in tcu. Pt has a black suitcase which is labeled and remains at the nurse's station

## 2017-01-01 NOTE — ED Notes (Signed)
TTS at bedside. 

## 2017-01-02 NOTE — Consult Note (Signed)
[facility] Face-to-Face Psychiatry Consult   Reason for Consult:  Suicidal and homicidal ideations Referring Physician:  EDP Patient Identification: Allen Sosa MRN:  [id number] Principal Diagnosis: Adjustment disorder with mixed disturbance of emotions and conduct Diagnosis:   Patient Active Problem List   Diagnosis Date Noted  . Adjustment disorder with mixed disturbance of emotions and conduct [F43.25] 01/01/2017    Priority: High  . Abdominal pain [R10.9] 11/20/2014  . Abdominal pain in male [R10.9] 11/18/2014    Total Time spent with patient: 45 minutes  Subjective:   Allen Sosa is a 36 y.o. male patient denies suicidal/homicidal ideations," just need to see my psychiatrist today for my shot."  HPI:  36 yo male who presented to the ED after getting upset with his wife and having suicidal/homicidal ideations.  He reports they are separating as "It is time for [place] to split."  Allen Sosa plans to leave for [place] to live with family upon discharge.  He got upset because he was trying to get back in their place of living but his wife had the key at work and he was banging on the door trying to awaken their son to let him in.  Allen Sosa states it was cold and he was upset, "thought I should come in before I did something stupid."  No assault charges or other legal charges.  Denies suicidal/homicidal ideations, hallucinations, and alcohol/drug abuse.  He wants to leave and see his psychiatrist, Dr. Mitzi Hansen, for his Hinda Glatter injection today and Rx prior to leaving for [place].  Suitcase is in his locker.  Stable for discharge.  Past Psychiatric History: schizoaffective disorder  Risk to Self: None Risk to Others: None Prior Inpatient Therapy: Prior Inpatient Therapy: Yes Prior Therapy Dates: Unknown Prior Therapy Facilty/Provider(s): Pt reported a facility in [place]. Reason for Treatment: Schizophrenia.  Prior Outpatient Therapy: Prior Outpatient Therapy: Yes Prior Therapy Dates: Current Prior Therapy  Facilty/Provider(s): Dr. Omelia Blackwater Reason for Treatment: Medication management.  Does patient have an ACCT team?: No Does patient have Intensive In-House Services?  : No Does patient have [facility] services? : No Does patient have P4CC services?: No  Past Medical History:  Past Medical History:  Diagnosis Date  . Anxiety   . Depression   . Hyperlipemia   . Hypertension   . Obesity   . Schizo affective schizophrenia (HCC)   . Sleep apnea     Past Surgical History:  Procedure Laterality Date  . VASECTOMY     Family History:  Family History  Problem Relation Age of Onset  . Heart attack Mother   . Cancer Mother   . Cancer Sister    Family Psychiatric  History: none Social History:  History  Alcohol Use No     History  Drug Use No    Social History   Social History  . Marital status: Married    Spouse name: N/A  . Number of children: 1  . Years of education: 51   Occupational History  . [employer]    Social History Main Topics  . Smoking status: Never Smoker  . Smokeless tobacco: Never Used  . Alcohol use No  . Drug use: No  . Sexual activity: Not Asked   Other Topics Concern  . None   Social History Narrative   Drinks caffeine daily    Additional Social History:    Allergies:  No Known Allergies  Labs:  Results for orders placed or performed during the hospital encounter of 01/01/17 (from the  past 48 hour(s))  Comprehensive metabolic panel     Status: Abnormal   Collection Time: 01/01/17  2:28 AM  Result Value Ref Range   Sodium 136 135 - 145 mmol/L   Potassium 3.9 3.5 - 5.1 mmol/L   Chloride 103 101 - 111 mmol/L   CO2 23 22 - 32 mmol/L   Glucose, Bld 157 (H) 65 - 99 mg/dL   BUN 9 6 - 20 mg/dL   Creatinine, Ser 1.22 0.61 - 1.24 mg/dL   Calcium 9.0 8.9 - 10.3 mg/dL   Total Protein 7.8 6.5 - 8.1 g/dL   Albumin 3.8 3.5 - 5.0 g/dL   AST 45 (H) 15 - 41 U/L   ALT 32 17 - 63 U/L   Alkaline Phosphatase 97 38 - 126 U/L   Total Bilirubin 0.6 0.3 - 1.2  mg/dL   GFR calc non Af Amer >60 >60 mL/min   GFR calc Af Amer >60 >60 mL/min    Comment: (NOTE) The eGFR has been calculated using the CKD EPI equation. This calculation has not been validated in all clinical situations. eGFR's persistently <60 mL/min signify possible Chronic Kidney Disease.    Anion gap 10 5 - 15  Ethanol     Status: None   Collection Time: 01/01/17  2:28 AM  Result Value Ref Range   Alcohol, Ethyl (B) <5 <5 mg/dL    Comment:        LOWEST DETECTABLE LIMIT FOR SERUM ALCOHOL IS 5 mg/dL FOR MEDICAL PURPOSES ONLY   Salicylate level     Status: None   Collection Time: 01/01/17  2:28 AM  Result Value Ref Range   Salicylate Lvl <9.9 2.8 - 30.0 mg/dL  Acetaminophen level     Status: Abnormal   Collection Time: 01/01/17  2:28 AM  Result Value Ref Range   Acetaminophen (Tylenol), Serum <10 (L) 10 - 30 ug/mL    Comment:        THERAPEUTIC CONCENTRATIONS VARY SIGNIFICANTLY. A RANGE OF 10-30 ug/mL MAY BE AN EFFECTIVE CONCENTRATION FOR MANY PATIENTS. HOWEVER, SOME ARE BEST TREATED AT CONCENTRATIONS OUTSIDE THIS RANGE. ACETAMINOPHEN CONCENTRATIONS >150 ug/mL AT 4 HOURS AFTER INGESTION AND >50 ug/mL AT 12 HOURS AFTER INGESTION ARE OFTEN ASSOCIATED WITH TOXIC REACTIONS.   cbc     Status: Abnormal   Collection Time: 01/01/17  2:28 AM  Result Value Ref Range   WBC 6.7 4.0 - 10.5 K/uL   RBC 4.36 4.22 - 5.81 MIL/uL   Hemoglobin 12.3 (L) 13.0 - 17.0 g/dL   HCT 36.4 (L) 39.0 - 52.0 %   MCV 83.5 78.0 - 100.0 fL   MCH 28.2 26.0 - 34.0 pg   MCHC 33.8 30.0 - 36.0 g/dL   RDW 13.8 11.5 - 15.5 %   Platelets 192 150 - 400 K/uL    No current facility-administered medications for this encounter.    Current Outpatient Prescriptions  Medication Sig Dispense Refill  . clonazePAM (KLONOPIN) 2 MG tablet Take 2 mg by mouth 3 (three) times daily as needed for anxiety.   0  . dicyclomine (BENTYL) 20 MG tablet Take 20 mg by mouth daily.     . divalproex (DEPAKOTE ER) 500 MG 24  hr tablet Take 500 mg by mouth 2 (two) times daily.  1  . ibuprofen (ADVIL,MOTRIN) 200 MG tablet Take 600 mg by mouth every 4 (four) hours as needed for headache (for pain.).     Marland Kitchen LATUDA 60 MG TABS Take 60 mg by  mouth daily.  1  . omeprazole (PRILOSEC) 20 MG capsule Take 40 mg by mouth 2 (two) times daily.     . Paliperidone Palmitate (INVEGA SUSTENNA IM) Inject into the muscle every 3 (three) months. Patient receives at doctors office and does not know mg      Musculoskeletal: Strength & Muscle Tone: within normal limits Gait & Station: normal Patient leans: N/A  Psychiatric Specialty Exam: Physical Exam  Constitutional: He is oriented to person, place, and time. He appears well-developed and well-nourished.  HENT:  Head: Normocephalic.  Neck: Normal range of motion.  Respiratory: Effort normal.  Musculoskeletal: Normal range of motion.  Neurological: He is alert and oriented to person, place, and time.  Psychiatric: He has a normal mood and affect. His speech is normal and behavior is normal. Judgment and thought content normal. Cognition and memory are normal.    Review of Systems  All other systems reviewed and are negative.   Blood pressure (!) 162/103, pulse 94, temperature 98.3 F (36.8 C), temperature source Oral, resp. rate 20, SpO2 96 %.There is no height or weight on file to calculate BMI.  General Appearance: Casual  Eye Contact:  Good  Speech:  Normal Rate  Volume:  Normal  Mood:  Euthymic  Affect:  Congruent  Thought Process:  Coherent and Descriptions of Associations: Intact  Orientation:  Full (Time, Place, and Person)  Thought Content:  WDL and Logical  Suicidal Thoughts:  No  Homicidal Thoughts:  No  Memory:  Immediate;   Good Recent;   Good Remote;   Good  Judgement:  Good  Insight:  Good  Psychomotor Activity:  Normal  Concentration:  Concentration: Good and Attention Span: Good  Recall:  Good  Fund of Knowledge:  Good  Language:  Good  Akathisia:   No  Handed:  Right  AIMS (if indicated):     Assets:  Housing Leisure Time Physical Health Resilience Social Support  ADL's:  Intact  Cognition:  WNL  Sleep:        Treatment Plan Summary: Daily contact with patient to assess and evaluate symptoms and progress in treatment, Medication management and Plan adjustment disorder with mixed emotions and conduct:  -Crisis stabilization -Medication management:  Restarted his Latuda 60 mg daily for mood, Depakote 500 mg BID for mood but not his Klonopin 2 mg TID for anxiety. -Individual counseling  Disposition: No evidence of imminent risk to self or others at present.    Allen Boga, NP 01/02/2017 6:02 AM  Patient seen face-to-face for psychiatric evaluation, chart reviewed and case discussed with the physician extender and developed treatment plan. Reviewed the information documented and agree with the treatment plan. Allen Pilgrim, MD

## 2017-01-02 NOTE — [facility] Suicide Risk Assessment (Signed)
Suicide Risk Assessment  Discharge Assessment   [facility]   Principal Problem: Adjustment disorder with mixed disturbance of emotions and conduct Discharge Diagnoses:  Patient Active Problem List   Diagnosis Date Noted  . Adjustment disorder with mixed disturbance of emotions and conduct [F43.25] 01/01/2017    Priority: High  . Abdominal pain [R10.9] 11/20/2014  . Abdominal pain in male [R10.9] 11/18/2014    Total Time spent with patient: 45 minutes  Musculoskeletal: Strength & Muscle Tone: within normal limits Gait & Station: normal Patient leans: N/A  Psychiatric Specialty Exam: Physical Exam  Constitutional: He is oriented to person, place, and time. He appears well-developed and well-nourished.  HENT:  Head: Normocephalic.  Neck: Normal range of motion.  Respiratory: Effort normal.  Musculoskeletal: Normal range of motion.  Neurological: He is alert and oriented to person, place, and time.  Psychiatric: He has a normal mood and affect. His speech is normal and behavior is normal. Judgment and thought content normal. Cognition and memory are normal.    Review of Systems  All other systems reviewed and are negative.   Blood pressure (!) 162/103, pulse 94, temperature 98.3 F (36.8 C), temperature source Oral, resp. rate 20, SpO2 96 %.There is no height or weight on file to calculate BMI.  General Appearance: Casual  Eye Contact:  Good  Speech:  Normal Rate  Volume:  Normal  Mood:  Euthymic  Affect:  Congruent  Thought Process:  Coherent and Descriptions of Associations: Intact  Orientation:  Full (Time, Place, and Person)  Thought Content:  WDL and Logical  Suicidal Thoughts:  No  Homicidal Thoughts:  No  Memory:  Immediate;   Good Recent;   Good Remote;   Good  Judgement:  Good  Insight:  Good  Psychomotor Activity:  Normal  Concentration:  Concentration: Good and Attention Span: Good  Recall:  Good  Fund of Knowledge:  Good   Language:  Good  Akathisia:  No  Handed:  Right  AIMS (if indicated):     Assets:  Housing Leisure Time Physical Health Resilience Social Support  ADL's:  Intact  Cognition:  WNL  Sleep:       Mental Status Per Nursing Assessment::   On Admission:   suicidal/homicidal ideations  Demographic Factors:  Male  Loss Factors: Loss of significant relationship  Historical Factors: NA  Risk Reduction Factors:   Sense of responsibility to family, Living with another person, especially a relative, Positive social support and Positive therapeutic relationship  Continued Clinical Symptoms:  None  Cognitive Features That Contribute To Risk:  None    Suicide Risk:  Minimal: No identifiable suicidal ideation.  Patients presenting with no risk factors but with morbid ruminations; may be classified as minimal risk based on the severity of the depressive symptoms    Plan Of Care/Follow-up recommendations:  Activity:  as tolerated Diet:  heart healthy diet  Gohan Collister, NP 01/02/2017, 6:11 AM

## 2017-01-05 ENCOUNTER — Emergency Department (HOSPITAL_COMMUNITY)
Admission: EM | Admit: 2017-01-05 | Discharge: 2017-01-05 | Disposition: A | Discharge status: Home / Self Care | Payer: Medicaid Other | Attending: Emergency Medicine | Admitting: Emergency Medicine

## 2017-01-05 ENCOUNTER — Encounter (HOSPITAL_COMMUNITY): Payer: Self-pay | Admitting: Emergency Medicine

## 2017-01-05 DIAGNOSIS — R45851 Suicidal ideations: Secondary | ICD-10-CM | POA: Diagnosis present

## 2017-01-05 DIAGNOSIS — F209 Schizophrenia, unspecified: Secondary | ICD-10-CM | POA: Insufficient documentation

## 2017-01-05 DIAGNOSIS — I10 Essential (primary) hypertension: Secondary | ICD-10-CM | POA: Diagnosis not present

## 2017-01-05 DIAGNOSIS — Z79899 Other long term (current) drug therapy: Secondary | ICD-10-CM | POA: Insufficient documentation

## 2017-01-05 LAB — COMPREHENSIVE METABOLIC PANEL
ALBUMIN: 3.8 g/dL (ref 3.5–5.0)
ALT: 33 U/L (ref 17–63)
ANION GAP: 8 (ref 5–15)
AST: 37 U/L (ref 15–41)
Alkaline Phosphatase: 81 U/L (ref 38–126)
BILIRUBIN TOTAL: 0.6 mg/dL (ref 0.3–1.2)
BUN: 11 mg/dL (ref 6–20)
CO2: 25 mmol/L (ref 22–32)
Calcium: 8.9 mg/dL (ref 8.9–10.3)
Chloride: 104 mmol/L (ref 101–111)
Creatinine, Ser: 1.05 mg/dL (ref 0.61–1.24)
GFR calc Af Amer: >60 mL/min (ref >60)
GFR calc non Af Amer: >60 mL/min (ref >60)
GLUCOSE: 112 mg/dL — AB (ref 65–99)
POTASSIUM: 3.6 mmol/L (ref 3.5–5.1)
Sodium: 137 mmol/L (ref 135–145)
TOTAL PROTEIN: 7.4 g/dL (ref 6.5–8.1)

## 2017-01-05 LAB — CBC
HEMATOCRIT: 35.8 % — AB (ref 39.0–52.0)
Hemoglobin: 12.6 g/dL — ABNORMAL LOW (ref 13.0–17.0)
MCH: 29 pg (ref 26.0–34.0)
MCHC: 35.2 g/dL (ref 30.0–36.0)
MCV: 82.5 fL (ref 78.0–100.0)
Platelets: 194 10*3/uL (ref 150–400)
RBC: 4.34 MIL/uL (ref 4.22–5.81)
RDW: 13.7 % (ref 11.5–15.5)
WBC: 8.5 10*3/uL (ref 4.0–10.5)

## 2017-01-05 LAB — SALICYLATE LEVEL: Salicylate Lvl: <7 mg/dL (ref 2.8–30.0)

## 2017-01-05 LAB — ETHANOL

## 2017-01-05 LAB — ACETAMINOPHEN LEVEL

## 2017-01-05 NOTE — ED Notes (Signed)
Bed: WTR6 Expected date:  Expected time:  Means of arrival:  Comments: 

## 2017-01-05 NOTE — ED Notes (Signed)
ED Provider at bedside. 

## 2017-01-05 NOTE — ED Provider Notes (Signed)
[facility]-EMERGENCY DEPT Provider Note   CSN: [id number] Arrival date & time: 01/05/17  0305  By signing my name below, I, Arianna Nassar, attest that this documentation has been prepared under the direction and in the presence of Geoffery Lyonsouglas Monifa Blanchette, MD.  Electronically Signed: Octavia HeirArianna Nassar, ED Scribe. 01/05/17. 3:41 AM.   History   Chief Complaint Chief Complaint  Patient presents with  . Suicidal  . Hallucinations    The history is provided by the patient. No language interpreter was used.  Mental Health Problem  Presenting symptoms: depression, hallucinations and suicidal thoughts   Presenting symptoms: no homicidal ideas   Degree of incapacity (severity):  Moderate Onset quality:  Unable to specify Timing:  Intermittent Progression:  Worsening Chronicity:  Recurrent Context: stressful life event   Context: not noncompliant and not recent medication change   Treatment compliance:  Unable to specify Relieved by:  None tried Worsened by:  Nothing Ineffective treatments:  Antipsychotics Associated symptoms: no abdominal pain, no anxiety and no chest pain   Risk factors: hx of mental illness    HPI Comments: Allen RenMichael Vachon V. is a 36 y.o. male who has a PMhx of HLD, HTN, schizophrenia, and anxiety presents to the Emergency Department with increased depression over the past few days. He expresses suicidal ideations and auditory hallucinations. Pt says that the voices are telling him to "get pissed off to fight someone" and to "hurt my wife". He notes being under a lot of stress lately. Pt says he and his wife are separated and he is currently homeless. Pt was seen on 01/08 for similar suicidal ideations but reports he did not want to stay. He has been compliant with his medications that include Depakote, Klonopin and Bentyl. He denies visual hallucinations and HI.   Past Medical History:  Diagnosis Date  . Anxiety   . Depression   . Hyperlipemia   . Hypertension   . Obesity   .  Schizo affective schizophrenia (HCC)   . Sleep apnea     Patient Active Problem List   Diagnosis Date Noted  . 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 Abdominal pain 11/20/2014  . Abdominal pain in male 11/18/2014    Past Surgical History:  Procedure Laterality Date  . VASECTOMY         Home Medications    Prior to Admission medications   Medication Sig Start Date End Date Taking? Authorizing Provider  clonazePAM (KLONOPIN) 2 MG tablet Take 2 mg by mouth 3 (three) times daily as needed for anxiety.  09/30/15   Historical Provider, MD  dicyclomine (BENTYL) 20 MG tablet Take 20 mg by mouth daily.     Historical Provider, MD  divalproex (DEPAKOTE ER) 500 MG 24 hr tablet Take 500 mg by mouth 2 (two) times daily. 12/15/16   Historical Provider, MD  ibuprofen (ADVIL,MOTRIN) 200 MG tablet Take 600 mg by mouth every 4 (four) hours as needed for headache (for pain.).     Historical Provider, MD  LATUDA 60 MG TABS Take 60 mg by mouth daily. 11/15/16   Historical Provider, MD  omeprazole (PRILOSEC) 20 MG capsule Take 40 mg by mouth 2 (two) times daily.     Historical Provider, MD  Paliperidone Palmitate (INVEGA SUSTENNA IM) Inject into the muscle every 3 (three) months. Patient receives at doctors office and does not know mg    Historical Provider, MD    Family History Family History  Problem Relation Age of Onset  .  Heart attack Mother   . Cancer Mother   . Cancer Sister     Social History Social History  Substance Use Topics  . Smoking status: Never Smoker  . Smokeless tobacco: Never Used  . Alcohol use No     Allergies   Patient has no known allergies.   Review of Systems Review of Systems  Cardiovascular: Negative for chest pain.  Gastrointestinal: Negative for abdominal pain.  Psychiatric/Behavioral: Positive for hallucinations and suicidal ideas. Negative for homicidal ideas. The patient is not nervous/anxious.   All other  systems reviewed and are negative.    Physical Exam Updated Vital Signs BP 154/86 (BP Location: Right Arm)   Pulse 95   Temp 99.1 F (37.3 C) (Oral)   Resp 18   SpO2 93%   Physical Exam  Constitutional: He is oriented to person, place, and time. He appears well-developed and well-nourished.  HENT:  Head: Normocephalic and atraumatic.  Eyes: EOM are normal.  Neck: Normal range of motion.  Cardiovascular: Normal rate, regular rhythm, normal heart sounds and intact distal pulses.   Pulmonary/Chest: Effort normal and breath sounds normal. No respiratory distress.  Abdominal: Soft. He exhibits no distension. There is no tenderness.  Musculoskeletal: Normal range of motion.  Neurological: He is alert and oriented to person, place, and time.  Skin: Skin is warm and dry.  Psychiatric: His speech is normal. He is withdrawn and actively hallucinating. Cognition and memory are normal. He expresses impulsivity. He exhibits a depressed mood. He expresses suicidal ideation.  Nursing note and vitals reviewed.    ED Treatments / Results  DIAGNOSTIC STUDIES: Oxygen Saturation is 93% on RA, low by my interpretation.  COORDINATION OF CARE:  3:41 AM Discussed treatment plan with pt at bedside and pt agreed to plan.  Labs (all labs ordered are listed, but only abnormal results are displayed) Labs Reviewed  COMPREHENSIVE METABOLIC PANEL  ETHANOL  SALICYLATE LEVEL  ACETAMINOPHEN LEVEL  CBC  RAPID URINE DRUG SCREEN, HOSP PERFORMED    EKG  EKG Interpretation None       Radiology No results found.  Procedures Procedures (including critical care time)  Medications Ordered in ED Medications - No data to display   Initial Impression / Assessment and Plan / ED Course  I have reviewed the triage vital signs and the nursing notes.  Pertinent labs & imaging results that were available during my care of the patient were reviewed by me and considered in my medical decision making  (see chart for details).  Clinical Course     Patient is a 36 year old male with history of psychiatric illness. He presents for evaluation of suicidal ideation. The patient was evaluated by TTS who felt the patient should undergo psychiatric consultation in the morning. While the patient was waiting, he apparently was informed he had a job offer/training for this morning. Patient tells me he is no longer suicidal and does not want to miss this employment opportunity. He is informed me that he can contract for safety and will return if his situation worsens. He does not appear to be acutely psychotic and I believe has the capacity to make his own decisions.  Final Clinical Impressions(s) / ED Diagnoses   Final diagnoses:  None   I personally performed the services described in this documentation, which was scribed in my presence. The recorded information has been reviewed and is accurate.   New Prescriptions New Prescriptions   No medications on file     Revillo  Raydon Chappuis, MD 01/05/17 8119

## 2017-01-05 NOTE — ED Notes (Signed)
TTS in with pt. 

## 2017-01-05 NOTE — ED Triage Notes (Signed)
Pt brought in voluntarily by [employer]  Pt states he is depressed and has been having audible hallucinations  Pt states him and his wife have been arguing a lot and he is currently homeless  Pt states he has a lot of stressors lately  Pt states he has been having suicidal thoughts with a plan to take all of his medications

## 2017-01-05 NOTE — ED Notes (Signed)
Pt states he wants to leave because he has job training he has to be at this morning  Spoke with EDP and TTS  Recommend pt to stay and see psych this am  EDP notified of recommendation  EDP in to speak with pt

## 2017-01-05 NOTE — ED Notes (Signed)
Pt in regular bariatric hospital gown because pt not able to fit psych scrubs or psych gown

## 2017-01-05 NOTE — BH Assessment (Signed)
Tele Assessment Note   Allen Sosa is an 36 y.o. male, African American, Separated who  Presents to [employer] per Ed report: has a PMhx of HLD, HTN, schizophrenia, and anxiety presents to the Emergency Department with increased depression over the past few days. He expresses suicidal ideations and auditory hallucinations. Pt says that the voices are telling him to "get pissed off to fight someone" and to "hurt my wife". He notes being under a lot of stress lately. Pt says he and his wife are separated and he is currently homeless. Pt was seen on 01/08 for similar suicidal ideations but reports he did not want to stay. He has been compliant with his medications that include Depakote, Klonopin and Bentyl. He denies visual hallucinations and HI. Patient states primary concern as increase depression with SI / plan and AVH. Patient states that he has separated from wife and is homeless. Patient states he has decrease in sleep with as little as x 2 hours per night.  Patient acknowledges current SI with plan o.d. On meds. Patient denies current HI. Patient acknowledges current AVh with command voices to hurt wife or fight someone. Patient states current AVH with VH making feel like in dream state. Patient denies S.A. Patient states was seen for inpatient unspecified time frame and no location for schizophrenia. Patient is seen outpatient via Dr. Girtha Hake for schizophrenia/ med. Management.  Patient is dressed in hospital gown and is alert and oriented x4. Patient speech was within normal limits and motor behavior appeared normal. Patient thought process is coherent. Patient does not appear to be responding to internal stimuli. Patient was cooperative throughout the assessment and states that he is agreeable to inpatient psychiatric treatment.  Diagnosis: Schizophrenia  Past Medical History:  Past Medical History:  Diagnosis Date  . Anxiety   . Depression   . Hyperlipemia   . Hypertension   . Obesity    . Schizo affective schizophrenia (HCC)   . Sleep apnea     Past Surgical History:  Procedure Laterality Date  . VASECTOMY      Family History:  Family History  Problem Relation Age of Onset  . Heart attack Mother   . Cancer Mother   . Cancer Sister     Social History:  reports that he has never smoked. He has never used smokeless tobacco. He reports that he does not drink alcohol or use drugs.  Additional Social History:  Alcohol / Drug Use Pain Medications: SEE MAR Prescriptions: SEE MAR Over the Counter: SEE MAR History of alcohol / drug use?: No history of alcohol / drug abuse  CIWA: CIWA-Ar BP: 154/86 Pulse Rate: 95 COWS:    PATIENT STRENGTHS: (choose at least two) Active sense of humor Average or above average intelligence Communication skills  Allergies: No Known Allergies  Home Medications:  (Not in a hospital admission)  OB/GYN Status:  No LMP for male patient.  General Assessment Data Location of Assessment: [facility] ED TTS Assessment: In system Is this a Tele or Face-to-Face Assessment?: Face-to-Face Is this an Initial Assessment or a Re-assessment for this encounter?: Initial Assessment Marital status: Separated Maiden name: n/a Is patient pregnant?: No Pregnancy Status: No Living Arrangements: Other (Comment) (homeless) Can pt return to current living arrangement?: Yes Admission Status: Voluntary Is patient capable of signing voluntary admission?: Yes Referral Source: Self/Family/Friend Insurance type: Medicaid     Crisis Care Plan Living Arrangements: Other (Comment) (homeless) Legal Guardian: Other: (none) Name of Psychiatrist: Dr. Kendrick Fries  [facility] Name of Therapist: na  Education Status Is patient currently in school?: No Current Grade: na Highest grade of school patient has completed: 11th Name of school: na Contact person: none given  Risk to self with the past 6 months Suicidal Ideation: Yes-Currently Present Has patient  been a risk to self within the past 6 months prior to admission? : No Suicidal Intent: Yes-Currently Present Has patient had any suicidal intent within the past 6 months prior to admission? : No Is patient at risk for suicide?: Yes Suicidal Plan?: Yes-Currently Present Has patient had any suicidal plan within the past 6 months prior to admission? : No Specify Current Suicidal Plan: o.d. on meds Access to Means: Yes Specify Access to Suicidal Means: access to pills What has been your use of drugs/alcohol within the last 12 months?: none Previous Attempts/Gestures: Yes How many times?: 2 Other Self Harm Risks: denies Triggers for Past Attempts: Spouse contact Intentional Self Injurious Behavior: Cutting Comment - Self Injurious Behavior: pt reported slit wrist in past Family Suicide History: No Recent stressful life event(s): Other (Comment) Persecutory voices/beliefs?: Yes Depression: Yes Depression Symptoms: Despondent, Insomnia, Tearfulness, Isolating, Fatigue, Guilt, Loss of interest in usual pleasures, Feeling worthless/self pity Substance abuse history and/or treatment for substance abuse?: No Suicide prevention information given to non-admitted patients: Yes  Risk to Others within the past 6 months Homicidal Ideation: No Does patient have any lifetime risk of violence toward others beyond the six months prior to admission? : No Thoughts of Harm to Others: No Current Homicidal Intent: No Current Homicidal Plan: No Access to Homicidal Means: No Identified Victim: none History of harm to others?: No Assessment of Violence: None Noted Violent Behavior Description: na Does patient have access to weapons?: No Criminal Charges Pending?: No Does patient have a court date: No Is patient on probation?: No  Psychosis Hallucinations: Auditory, Visual Delusions: None noted  Mental Status Report Appearance/Hygiene: In hospital gown Eye Contact: Good Motor Activity: Freedom of  movement Speech: Logical/coherent Level of Consciousness: Alert Mood: Depressed Affect: Depressed Anxiety Level: Panic Attacks Panic attack frequency: daily Most recent panic attack: 01-05-17 Thought Processes: Coherent, Relevant Judgement: Unimpaired Orientation: Person, Place, Time, Situation, Appropriate for developmental age Obsessive Compulsive Thoughts/Behaviors: None  Cognitive Functioning Concentration: Good Memory: Recent Intact, Remote Intact IQ: Average Insight: Good Impulse Control: Good Appetite: Poor Weight Loss: 0 Weight Gain: 0 Sleep: Decreased Total Hours of Sleep: 2 Vegetative Symptoms: None  ADLScreening [facility]) Patient's cognitive ability adequate to safely complete daily activities?: Yes Patient able to express need for assistance with ADLs?: Yes Independently performs ADLs?: Yes (appropriate for developmental age)  Prior Inpatient Therapy Prior Inpatient Therapy: Yes Prior Therapy Dates: unknown Prior Therapy Facilty/Provider(s): pt did not report prior Reason for Treatment: Schizophrenia  Prior Outpatient Therapy Prior Outpatient Therapy: Yes Prior Therapy Dates: current Prior Therapy Facilty/Provider(s): Dr. Arville Go[facility] Reason for Treatment: med. mngmt. Does patient have an ACCT team?: No Does patient have Intensive In-House Services?  : No Does patient have [facility] services? : No Does patient have P4CC services?: No  ADL Screening (condition at time of admission) Patient's cognitive ability adequate to safely complete daily activities?: Yes Is the patient deaf or have difficulty hearing?: No Does the patient have difficulty seeing, even when wearing glasses/contacts?: No Does the patient have difficulty concentrating, remembering, or making decisions?: No Patient able to express need for assistance with ADLs?: Yes Does the patient have difficulty dressing or bathing?: No Independently  performs ADLs?: Yes  (appropriate for developmental age) Does the patient have difficulty walking or climbing stairs?: Yes Weakness of Legs: Both Weakness of Arms/Hands: None       Abuse/Neglect Assessment (Assessment to be complete while patient is alone) Physical Abuse: Denies Verbal Abuse: Denies Sexual Abuse: Denies Exploitation of patient/patient's resources: Denies Self-Neglect: Denies Values / Beliefs Cultural Requests During Hospitalization: None Spiritual Requests During Hospitalization: None   Advance Directives (For Healthcare) Does Patient Have a Medical Advance Directive?: No    Additional Information 1:1 In Past 12 Months?: No CIRT Risk: No Elopement Risk: No Does patient have medical clearance?: Yes     Disposition: Per Nira Conn, NP recommend a.m. Psych eval. Disposition Initial Assessment Completed for this Encounter: Yes Disposition of Patient: Other dispositions (TBD)  Hipolito Bayley 01/05/2017 4:33 AM

## 2017-01-05 NOTE — Progress Notes (Signed)
Per Nira ConnJason Berry, NP recommend a.m. Psych eval. Doy Taaffe K. Sherlon HandingHarris, LCAS-A, LPC-A, [facility]  Counselor 01/05/2017 4:44 AM

## 2017-01-06 ENCOUNTER — Emergency Department (HOSPITAL_COMMUNITY): Payer: Medicaid Other

## 2017-01-06 ENCOUNTER — Encounter (HOSPITAL_COMMUNITY): Payer: Self-pay | Admitting: Neurology

## 2017-01-06 ENCOUNTER — Emergency Department (HOSPITAL_COMMUNITY)
Admission: EM | Admit: 2017-01-06 | Discharge: 2017-01-08 | Disposition: A | Discharge status: Other Acute Inpatient Hospital | Payer: Medicaid Other | Attending: Emergency Medicine | Admitting: Emergency Medicine

## 2017-01-06 DIAGNOSIS — Z79899 Other long term (current) drug therapy: Secondary | ICD-10-CM | POA: Insufficient documentation

## 2017-01-06 DIAGNOSIS — I10 Essential (primary) hypertension: Secondary | ICD-10-CM | POA: Insufficient documentation

## 2017-01-06 DIAGNOSIS — F32A Depression, unspecified: Secondary | ICD-10-CM

## 2017-01-06 DIAGNOSIS — F250 Schizoaffective disorder, bipolar type: Secondary | ICD-10-CM | POA: Diagnosis present

## 2017-01-06 DIAGNOSIS — R0602 Shortness of breath: Secondary | ICD-10-CM | POA: Insufficient documentation

## 2017-01-06 DIAGNOSIS — R079 Chest pain, unspecified: Secondary | ICD-10-CM | POA: Insufficient documentation

## 2017-01-06 DIAGNOSIS — F329 Major depressive disorder, single episode, unspecified: Secondary | ICD-10-CM | POA: Insufficient documentation

## 2017-01-06 LAB — RAPID URINE DRUG SCREEN, HOSP PERFORMED
AMPHETAMINES: NOT DETECTED
BARBITURATES: NOT DETECTED
BENZODIAZEPINES: NOT DETECTED
COCAINE: NOT DETECTED
Opiates: NOT DETECTED
TETRAHYDROCANNABINOL: NOT DETECTED

## 2017-01-06 LAB — COMPREHENSIVE METABOLIC PANEL
ALBUMIN: 3.6 g/dL (ref 3.5–5.0)
ALT: 34 U/L (ref 17–63)
AST: 36 U/L (ref 15–41)
Alkaline Phosphatase: 76 U/L (ref 38–126)
Anion gap: 9 (ref 5–15)
BILIRUBIN TOTAL: 0.2 mg/dL — AB (ref 0.3–1.2)
BUN: 6 mg/dL (ref 6–20)
CHLORIDE: 104 mmol/L (ref 101–111)
CO2: 25 mmol/L (ref 22–32)
Calcium: 9.1 mg/dL (ref 8.9–10.3)
Creatinine, Ser: 1.06 mg/dL (ref 0.61–1.24)
GFR calc Af Amer: >60 mL/min (ref >60)
GFR calc non Af Amer: >60 mL/min (ref >60)
GLUCOSE: 109 mg/dL — AB (ref 65–99)
POTASSIUM: 4 mmol/L (ref 3.5–5.1)
SODIUM: 138 mmol/L (ref 135–145)
Total Protein: 7.2 g/dL (ref 6.5–8.1)

## 2017-01-06 LAB — ETHANOL: Alcohol, Ethyl (B): <5 mg/dL (ref <5)

## 2017-01-06 LAB — TROPONIN I

## 2017-01-06 LAB — BRAIN NATRIURETIC PEPTIDE: B Natriuretic Peptide: 16.6 pg/mL (ref 0.0–100.0)

## 2017-01-06 LAB — VALPROIC ACID LEVEL: Valproic Acid Lvl: 14 ug/mL — ABNORMAL LOW (ref 50.0–100.0)

## 2017-01-06 MED ORDER — LURASIDONE HCL 20 MG PO TABS
60.0000 mg | ORAL_TABLET | Freq: Every day | ORAL | Status: DC
  Administered 2017-01-06 – 2017-01-07 (×2): 60 mg via ORAL
  Filled 2017-01-06 (×2): qty 3

## 2017-01-06 MED ORDER — CLONAZEPAM 0.5 MG PO TABS
2.0000 mg | ORAL_TABLET | Freq: Three times a day (TID) | ORAL | Status: DC
  Administered 2017-01-06 – 2017-01-07 (×6): 2 mg via ORAL
  Filled 2017-01-06 (×6): qty 4

## 2017-01-06 MED ORDER — PANTOPRAZOLE SODIUM 40 MG PO TBEC
40.0000 mg | DELAYED_RELEASE_TABLET | Freq: Every day | ORAL | Status: DC
  Administered 2017-01-06 – 2017-01-08 (×3): 40 mg via ORAL
  Filled 2017-01-06 (×3): qty 1

## 2017-01-06 MED ORDER — DIVALPROEX SODIUM ER 500 MG PO TB24
500.0000 mg | ORAL_TABLET | Freq: Two times a day (BID) | ORAL | Status: DC
  Administered 2017-01-06 – 2017-01-07 (×4): 500 mg via ORAL
  Filled 2017-01-06 (×4): qty 1

## 2017-01-06 NOTE — ED Notes (Signed)
Pt on phone at nurses' desk. 

## 2017-01-06 NOTE — BH Assessment (Signed)
Tele Assessment Note  Pt is cooperative and oriented x 4. Per chart review, he presented with SI last night to [facility] but left AMA. Today, pt presents again to [facility]. He endorses SI with plan to overdose on his psych meds or "jump in front of a truck." Pt reports two prior suicide attempts (slit wrists in 2001 and 2004). He reports he sees Dr Omelia Blackwater at [facility] for med management for schizophrenia. Pt says he isn't compliant with his psych meds b/c he is "trying to do what I want to do." Current stressors include his wife of 5 yrs leaving him and he says the psych meds aren't working. He reports one inpatient placement in [place] at unknown date. When writer asks pt about self harm, pt says, " I break my brain, just start stressing." He endorses moderate anxiety, guilt, sadness, tearfulness, hopelessness and decreased appetite (he reports weight fluctuates). He reports short term memory impairment and decreased concentration. He says his father has been dx with bipolar disorder and his dad has one prior suicide attempt. Pt endorses AH. He says the voices have conversations with each other, and the voices "get on my nerves."  Pt denies homicidal thoughts or physical aggression. Pt denies having access to firearms. He reports court date 02/16/17 for driving vehicle without insurance. Pt is calm and cooperative during assessment. Pt denies any current or past substance abuse problems. Pt does not appear to be intoxicated or in withdrawal at this time. He reports he stayed last night with is estranged wife but she won't let him live with her.   Jerrian Mells is an 36 y.o. male.   Diagnosis: Schizoaffective Disorder   Past Medical History:  Past Medical History:  Diagnosis Date  . Anxiety   . Depression   . Hyperlipemia   . Hypertension   . Obesity   . Schizo affective schizophrenia (HCC)   . Sleep apnea     Past Surgical History:  Procedure Laterality Date  . VASECTOMY      Family History:   Family History  Problem Relation Age of Onset  . Heart attack Mother   . Cancer Mother   . Cancer Sister     Social History:  reports that he has never smoked. He has never used smokeless tobacco. He reports that he does not drink alcohol or use drugs.  Additional Social History:  Alcohol / Drug Use Pain Medications: pt denies abuse - see pta meds list Prescriptions: pt denies abuse - see pta meds list Over the Counter: pt denies abuse - see pta meds list History of alcohol / drug use?: No history of alcohol / drug abuse Longest period of sobriety (when/how long): n/a  CIWA: CIWA-Ar BP: 175/84 Pulse Rate: 80 COWS:    PATIENT STRENGTHS: (choose at least two) Average or above average intelligence Capable of independent living Communication skills Motivation for treatment/growth  Allergies: No Known Allergies  Home Medications:  (Not in a hospital admission)  OB/GYN Status:  No LMP for male patient.  General Assessment Data Location of Assessment: [facility] ED TTS Assessment: In system Is this a Tele or Face-to-Face Assessment?: Tele Assessment Is this an Initial Assessment or a Re-assessment for this encounter?: Initial Assessment Marital status: Separated Maiden name: none Is patient pregnant?: No Pregnancy Status: No Living Arrangements:  (can no longer live w/ estranged wife) Can pt return to current living arrangement?: No Admission Status: Voluntary Is patient capable of signing voluntary admission?: Yes Referral Source:  Self/Family/Friend Insurance type: medicaid     Crisis Care Plan Living Arrangements:  (can no longer live w/ estranged wife) Name of Psychiatrist: Dr Omelia Blackwater - [facility] Name of Therapist: none  Education Status Is patient currently in school?: No Highest grade of school patient has completed: 11  Risk to self with the past 6 months Suicidal Ideation: Yes-Currently Present Has patient been a risk to self within the past 6 months  prior to admission? : No Suicidal Intent: Yes-Currently Present Has patient had any suicidal intent within the past 6 months prior to admission? : No Is patient at risk for suicide?: Yes Suicidal Plan?: Yes-Currently Present Has patient had any suicidal plan within the past 6 months prior to admission? : No Specify Current Suicidal Plan: overdose or "jump in front of a truck" Access to Means: Yes Specify Access to Suicidal Means: access to his psych meds & access to traffic What has been your use of drugs/alcohol within the last 12 months?: none Previous Attempts/Gestures: Yes How many times?: 2 (2001 & 2004 slit his wrists) Other Self Harm Risks: none Triggers for Past Attempts: Spouse contact Intentional Self Injurious Behavior: None Family Suicide History: No Recent stressful life event(s): Turmoil (Comment), Other (Comment) (wife leaving him, psych meds not working) Persecutory voices/beliefs?: No Depression: Yes Depression Symptoms: Despondent, Tearfulness, Guilt (decreased appetite) Substance abuse history and/or treatment for substance abuse?: No Suicide prevention information given to non-admitted patients: Not applicable  Risk to Others within the past 6 months Homicidal Ideation: No Does patient have any lifetime risk of violence toward others beyond the six months prior to admission? : No Thoughts of Harm to Others: No Current Homicidal Intent: No Current Homicidal Plan: No Access to Homicidal Means: No Identified Victim: none History of harm to others?: No Assessment of Violence: In past 6-12 months Violent Behavior Description: pt denies hx violence Does patient have access to weapons?: No Criminal Charges Pending?: Yes Describe Pending Criminal Charges: operating vehicle without insurance Does patient have a court date: Yes Court Date: 02/16/17 Is patient on probation?: No  Psychosis Hallucinations: Auditory Delusions: None noted  Mental Status  Report Appearance/Hygiene: Other (Comment) (in appropriate street clothing ) Eye Contact: Fair Motor Activity: Freedom of movement Speech: Logical/coherent Level of Consciousness: Quiet/awake, Alert Mood: Depressed, Anxious, Sad Affect: Blunted Anxiety Level: Moderate Thought Processes: Relevant, Coherent Judgement: Unimpaired Orientation: Person, Place, Time, Situation Obsessive Compulsive Thoughts/Behaviors: None  Cognitive Functioning Concentration: Normal Memory: Recent Impaired, Remote Intact IQ: Average Insight: Fair Impulse Control: Fair Appetite: Fair Weight Loss:  (pt reports poor appetite w/ fluctuating weight) Sleep: Decreased Total Hours of Sleep: 4 Vegetative Symptoms: None  ADLScreening [facility] Assessment Services) Patient's cognitive ability adequate to safely complete daily activities?: Yes Patient able to express need for assistance with ADLs?: Yes Independently performs ADLs?: Yes (appropriate for developmental age)  Prior Inpatient Therapy Prior Inpatient Therapy: Yes Prior Therapy Dates: unknown Prior Therapy Facilty/Provider(s): in [place] Reason for Treatment: schizophrenia  Prior Outpatient Therapy Prior Outpatient Therapy: Yes Prior Therapy Dates: currently Prior Therapy Facilty/Provider(s): [facility] - Dr Omelia Blackwater  Reason for Treatment: med management for schizophrenia Does patient have an ACCT team?: No Does patient have Intensive In-House Services?  : No Does patient have [facility] services? : No Does patient have P4CC services?: Unknown  ADL Screening (condition at time of admission) Patient's cognitive ability adequate to safely complete daily activities?: Yes Is the patient deaf or have difficulty hearing?: No Does the patient have difficulty seeing,  even when wearing glasses/contacts?: No Does the patient have difficulty concentrating, remembering, or making decisions?: No Patient able to express need for assistance with ADLs?: Yes Does  the patient have difficulty dressing or bathing?: No Independently performs ADLs?: Yes (appropriate for developmental age) Does the patient have difficulty walking or climbing stairs?: No Weakness of Legs: None Weakness of Arms/Hands: None  Home Assistive Devices/Equipment Home Assistive Devices/Equipment: None    Abuse/Neglect Assessment (Assessment to be complete while patient is alone) Physical Abuse: Denies Verbal Abuse: Denies Sexual Abuse: Denies Exploitation of patient/patient's resources: Denies Self-Neglect: Denies     Merchant navy officerAdvance Directives (For Healthcare) Does Patient Have a Medical Advance Directive?: No Would patient like information on creating a medical advance directive?: No - Patient declined Nutrition Screen- [facility] Adult/[facility]/AP Patient's home diet: Regular  Additional Information 1:1 In Past 12 Months?: No CIRT Risk: No Elopement Risk: No Does patient have medical clearance?: Yes     Disposition:  Disposition Initial Assessment Completed for this Encounter: Yes Disposition of Patient: Inpatient treatment program Type of inpatient treatment program: Adult (conrad withrow DNP recommends inpatient treatment)  Mahrosh Donnell P 01/06/2017 12:48 PM

## 2017-01-06 NOTE — ED Notes (Signed)
Patient transported to X-ray 

## 2017-01-06 NOTE — ED Notes (Signed)
Sitting in chair at bedside watching tv.

## 2017-01-06 NOTE — ED Notes (Addendum)
Pt arrived to F7 - ambulatory - wearing burgundy paper scrub pants and personal shirt d/t correct size paper scrub top not available. Pt verbalized understanding and signed Medical Clearance Pt policy form. Copy given to pt - copy placed on clipboard.     Pt's belongings placed at nurses' desk for inventory - 1 black suitace and 2 labeled belongings bags.

## 2017-01-06 NOTE — ED Notes (Signed)
Staffing made aware of need for sitter 

## 2017-01-06 NOTE — ED Notes (Signed)
Pt given a caffeine free diet coke and sugar free shortbread cookies

## 2017-01-06 NOTE — Discharge Instructions (Signed)
You're leaving against our advice. Feel free to return at any time for further treatment.

## 2017-01-06 NOTE — ED Triage Notes (Addendum)
Pt here with [employer] c/o cp x 1 month, sob, and having SI. Reports he and his wife are separated and he has a plan to take all of his medications. Given 324 aspirin, 5 mg albuterol for wheezing. Reports 7/10 cp. BP 154/90, HR 78, 100%. Pt is a x 4. Is schizophrenic and has not been taking his medications.

## 2017-01-06 NOTE — ED Notes (Signed)
TTS at bedside. 

## 2017-01-06 NOTE — ED Notes (Signed)
Pt given bus pass ?

## 2017-01-06 NOTE — ED Notes (Signed)
RN attempt to get blood, unable to get. Phlebotomy will come collect.

## 2017-01-06 NOTE — ED Provider Notes (Signed)
[facility]-EMERGENCY DEPT Provider Note   CSN: [id number] Arrival date & time: 01/06/17  5784     History   Chief Complaint Chief Complaint  Patient presents with  . Chest Pain  . Suicidal    HPI Allen Sosa is a 36 y.o. male.  HPI Patient presents with shortness of breath and chest pain. Reportedly has been off his medicines for years. Has some shortness of breath and wheezing. Has a history of high blood pressure. History of sleep apnea and states he does not use the machine. Also has schizophrenia and hallucinations. Reportedly voices telling her to himself. He had had a little bit of a plan to take his medicines. He wasn't [facility] yesterday for psych evaluation. Seen by psychiatry and was not considered imminent risk 5 days ago but was going to be evaluated by her psychiatrist yesterday but then left before the psychiatry evaluation. Patient states he is willing to stay now.   Past Medical History:  Diagnosis Date  . Anxiety   . Depression   . Hyperlipemia   . Hypertension   . Obesity   . Schizo affective schizophrenia (HCC)   . Sleep apnea     Patient Active Problem List   Diagnosis Date Noted  . 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 Abdominal pain 11/20/2014  . Abdominal pain in male 11/18/2014    Past Surgical History:  Procedure Laterality Date  . VASECTOMY         Home Medications    Prior to Admission medications   Medication Sig Start Date End Date Taking? Authorizing Provider  clonazePAM (KLONOPIN) 2 MG tablet Take 2 mg by mouth 3 (three) times daily.  09/30/15  Yes Historical Provider, MD  divalproex (DEPAKOTE ER) 500 MG 24 hr tablet Take 500 mg by mouth 2 (two) times daily. 12/15/16  Yes Historical Provider, MD  ibuprofen (ADVIL,MOTRIN) 200 MG tablet Take 200-400 mg by mouth every 4 (four) hours as needed for headache (for pain.).    Yes Historical Provider, MD  LATUDA 60 MG TABS Take 60 mg by mouth  daily. 11/15/16  Yes Historical Provider, MD  omeprazole (PRILOSEC) 20 MG capsule Take 40 mg by mouth 2 (two) times daily.    Yes Historical Provider, MD  Paliperidone Palmitate (INVEGA SUSTENNA IM) Inject into the muscle every 3 (three) months. Patient receives at doctors office and does not know mg   Yes Historical Provider, MD    Family History Family History  Problem Relation Age of Onset  . Heart attack Mother   . Cancer Mother   . Cancer Sister     Social History Social History  Substance Use Topics  . Smoking status: Never Smoker  . Smokeless tobacco: Never Used  . Alcohol use No     Allergies   Patient has no known allergies.   Review of Systems Review of Systems  Constitutional: Negative for appetite change.  HENT: Negative for congestion.   Respiratory: Positive for cough, shortness of breath and wheezing.   Cardiovascular: Positive for chest pain.  Gastrointestinal: Negative for abdominal pain.  Genitourinary: Negative for flank pain.  Musculoskeletal: Negative for back pain.  Psychiatric/Behavioral: Positive for hallucinations. Negative for confusion and self-injury.     Physical Exam Updated Vital Signs BP 175/84 (BP Location: Left Arm)   Pulse 80   Temp 98.1 F (36.7 C) (Oral)   Resp 18   Ht 6\' 4"  (1.93 m)   Wt Marland Kitchen)  440 lb (199.6 kg)   SpO2 97%   BMI 53.56 kg/m   Physical Exam  Constitutional: He appears well-developed.  HENT:  Head: Atraumatic.  Eyes: EOM are normal.  Neck: Neck supple.  Cardiovascular: Normal rate.   Pulmonary/Chest: Effort normal.  Abdominal: Soft.  Musculoskeletal: He exhibits no edema.  Neurological: He is alert.  Skin: Skin is warm. Capillary refill takes less than 2 seconds.  Psychiatric: His behavior is normal.     ED Treatments / Results  Labs (all labs ordered are listed, but only abnormal results are displayed) Labs Reviewed  COMPREHENSIVE METABOLIC PANEL - Abnormal; Notable for the following:        Result Value   Glucose, Bld 109 (*)    Total Bilirubin 0.2 (*)    All other components within normal limits  ETHANOL  RAPID URINE DRUG SCREEN, HOSP PERFORMED  TROPONIN I  BRAIN NATRIURETIC PEPTIDE  VALPROIC ACID LEVEL    EKG  EKG Interpretation  Date/Time:  Saturday January 06 2017 07:30:17 EST Ventricular Rate:  82 PR Interval:    QRS Duration: 80 QT Interval:  383 QTC Calculation: 448 R Axis:   -6 Text Interpretation:  Sinus rhythm Probable left atrial enlargement Confirmed by Rubin PayorPICKERING  MD, Stan Cantave [phone]([id number]) on 01/06/2017 10:16:28 AM       Radiology Dg Chest 2 View  Result Date: 01/06/2017 CLINICAL DATA:  Increased shortness of breath for 2-3 days. EXAM: CHEST  2 VIEW COMPARISON:  Two-view chest x-ray 10/28/2016 FINDINGS: The heart is enlarged. Mild pulmonary vascular congestion is present. There is no edema or effusion. No focal airspace consolidation is present. The visualized soft tissues and bony thorax are unremarkable. IMPRESSION: 1. Low lung volumes and mild pulmonary vascular congestion. Electronically Signed   By: Marin Robertshristopher  Mattern M.D.   On: 01/06/2017 08:19    Procedures Procedures (including critical care time)  Medications Ordered in ED Medications  clonazePAM (KLONOPIN) tablet 2 mg (not administered)  divalproex (DEPAKOTE ER) 24 hr tablet 500 mg (not administered)  Lurasidone HCl TABS 60 mg (not administered)  pantoprazole (PROTONIX) EC tablet 40 mg (not administered)     Initial Impression / Assessment and Plan / ED Course  I have reviewed the triage vital signs and the nursing notes.  Pertinent labs & imaging results that were available during my care of the patient were reviewed by me and considered in my medical decision making (see chart for details).  Clinical Course     Patient presents with depression and some hallucinations with suicidal thoughts. Somewhat passively suicidal. Seen yesterday for same and was going to have psych evaluation in  the morning but then he left. Now states he's been having chest pain. Shortness of breath. EKG lab work and imaging reassuring. Patient later changes monitor was not willing to stay. 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 States his problems come from being with his wife who he is not going to see. Then [facility] he was willing to stay. Medically cleared and will be seen by TTS.   Final Clinical Impressions(s) / ED Diagnoses   Final diagnoses:  Depression, unspecified depression type  Chest pain, unspecified type    New Prescriptions New Prescriptions   No medications on file     Benjiman CoreNathan Emira Eubanks, MD 01/06/17 1047

## 2017-01-06 NOTE — ED Notes (Signed)
Dr. Rubin PayorPickering at bedside to talk with patient. 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 Pt will be discharged. Does not want to stay to talk with TTS.

## 2017-01-06 NOTE — ED Notes (Signed)
Pt wanded by security and lunch tray ordered.

## 2017-01-06 NOTE — ED Notes (Signed)
Pt has returned to room and talking w/sitter.

## 2017-01-06 NOTE — ED Notes (Signed)
Pt reports he is no longer suicidal, "I didn't mean it. I was just upset with my wife". Requesting to leave by 11 to catch the bus. Dr. Rubin PayorPickering made aware.

## 2017-01-06 NOTE — ED Notes (Signed)
Pt calling spouse at nurses' desk to advise where he is and visiting times.

## 2017-01-06 NOTE — ED Notes (Signed)
Pt has decided to stay and talk with a psychiatrist. Pt changed into paper scrubs.

## 2017-01-06 NOTE — Progress Notes (Signed)
Patient was referred for inpatient treatment at the following facilities: [facility] (per Olegario MessierKathy, accepting referrals), [facility] (per Marcie MowersBrian), [facility] (per Judeth CornfieldStephanie), [facility] ([facility]), [facility] (per Pas[place]bara, 2 adult beds).  CSW will continue to seek placement.  Melbourne Abtsatia Nazli Penn, LCSWA Disposition staff 01/06/2017 3:43 PM

## 2017-01-07 DIAGNOSIS — Z808 Family history of malignant neoplasm of other organs or systems: Secondary | ICD-10-CM

## 2017-01-07 DIAGNOSIS — R109 Unspecified abdominal pain: Secondary | ICD-10-CM | POA: Diagnosis not present

## 2017-01-07 DIAGNOSIS — F250 Schizoaffective disorder, bipolar type: Secondary | ICD-10-CM | POA: Diagnosis not present

## 2017-01-07 DIAGNOSIS — Z8249 Family history of ischemic heart disease and other diseases of the circulatory system: Secondary | ICD-10-CM | POA: Diagnosis not present

## 2017-01-07 DIAGNOSIS — Z79899 Other long term (current) drug therapy: Secondary | ICD-10-CM

## 2017-01-07 DIAGNOSIS — F4325 Adjustment disorder with mixed disturbance of emotions and conduct: Secondary | ICD-10-CM

## 2017-01-07 MED ORDER — ACETAMINOPHEN 325 MG PO TABS
650.0000 mg | ORAL_TABLET | ORAL | Status: DC | PRN
  Administered 2017-01-07 – 2017-01-08 (×2): 650 mg via ORAL
  Filled 2017-01-07 (×2): qty 2

## 2017-01-07 NOTE — ED Notes (Signed)
[facility] AC trying to find a bariatric bed to accomodate pt. RN explained to pt the situation

## 2017-01-07 NOTE — ED Notes (Signed)
Pt sitting on side bed - voiced understanding of needing to stay.

## 2017-01-07 NOTE — ED Notes (Signed)
Sitter has returned from break; patient sitting on side of bed watching television

## 2017-01-07 NOTE — ED Notes (Signed)
Patient up ambulatory to the bathroom at this time without any difficulty or distress 

## 2017-01-07 NOTE — ED Notes (Signed)
IV[facility] paperwork given to Dr Criss AlvineGoldston d/t pt continues to state he wants to leave. Estrellita Ludwigautia, [facility], advised she will advise Renata Capriceonrad, NP, [facility], to call him to discuss.

## 2017-01-07 NOTE — ED Notes (Signed)
Spoke w/pt's spouse Thomas Hoff- Naja - per pt's request - [phone] - advised her of tx plan - inpt tx recommended.

## 2017-01-07 NOTE — ED Notes (Signed)
Pt states he is feeling stressed. States he stresses "about everything - my wife, my son, my life, my job". States he has not been inpt in approx 15 years. Encouraged pt to vent feelings. Advised pt [facility] will be able to provide him w/assistance w/this and his other behavioral health needs. Pt voiced appreciation and returned to room w/sitter.

## 2017-01-07 NOTE — ED Notes (Signed)
Telepsych being performed. 

## 2017-01-07 NOTE — Progress Notes (Signed)
Patient meets inpatient criteria, per Claudette Headonrad Withrow DNP on 01/07/17.  Patient has been referred to the following hospitals for inpatient psychiatric treatment: [facility] - left voicemail inquiring if beds are available today. [facility] - spoke with Tinnie GensJeffrey with admissions and was informed to refax. Referral refaxed with updated vitals. [facility] - per Arlys JohnBrian, RN will Regulatory affairs officercall writer. [facility] - per [place], at capacity today. Please call back in am. [facility]  - left voicemail inquiring of bed status [facility] - left voicemail.  CSW will continue to seek placement.  Allen Sosa, LCSWA Disposition staff 01/07/2017 10:45 AM

## 2017-01-07 NOTE — ED Notes (Signed)
Pt asked the RN if he could sign himself out voluntarily. RN informed the pt that we would like for him to stay. Pt asked for something to drink which was given. Pt currently in room

## 2017-01-07 NOTE — ED Notes (Signed)
Pt sitting on bed watching tv. 

## 2017-01-07 NOTE — ED Notes (Signed)
Pt on phone at nurses' desk talking to his spouse. Voiced understanding may have 2 phone calls/day.

## 2017-01-07 NOTE — ED Notes (Signed)
Pt ambulatory to nurses' desk. Repeating statement he wants to leave. RN requested for pt to please be patient w/the process and that [facility]him this am. Pt stated he is not going to attempt to leave - just wants RN to know he is ready. Pt returned to room as asked.

## 2017-01-07 NOTE — ED Notes (Signed)
Breakfast tray ordered 

## 2017-01-07 NOTE — Consult Note (Signed)
[facility] Telepsychiatry Consult   Reason for Consult:  Suicidal and homicidal ideations Referring Physician:  EDP Patient Identification: Allen Winkles V. MRN:  [id number] Principal Diagnosis: Schizoaffective disorder, bipolar type (HCC) Diagnosis:   Patient Active Problem List   Diagnosis Date Noted  . Schizoaffective disorder, bipolar type (HCC) [F25.0] 01/07/2017    Priority: High  . 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 Abdominal pain [R10.9] 11/20/2014  . Abdominal pain in male [R10.9] 11/18/2014    Total Time spent with patient: 30 minutes  Subjective:   Allen Smedberg V. is a 35 y.o. male patient presenting to ED with concerning collateral of plan to overdose; history of cutting both wrists in 2001. History of Schizoaffective bipolar and 2 recent visits for instability. Pt seen and chart reviewed. Pt is alert/oriented x4, calm, cooperative, and appropriate to situation. Pt denies suicidal/homicidal ideation and psychosis and does not appear to be responding to internal stimuli. However, collateral from wife (and chart review) is very disturbing. Pt's wife reports that, for a month, pt will not bathe or eat without serious encouragement, he stays awake for several days at a time, is very labile, aggressive, and responding to internal stimuli. Pt may be presenting well in this moment, but the collateral is too concerning (with recent chart reviews consistent with this), to discharge the pt. Seeking inpatient placement.    HPI:  I have reviewed and concur with HPI elements below, modified as follows: "Pt is cooperative and oriented x 4. Per chart review, he presented with SI last night to [facility] but left AMA. Today, pt presents again to [facility]. He endorses SI with plan to overdose on his psych meds or "jump in front of a truck." Pt reports two prior suicide attempts (slit wrists in 2001 and 2004). He reports he sees Dr Headen at [facility]  for med management for schizophrenia. Pt says he isn't compliant with his psych meds b/c he is "trying to do what I want to do." Current stressors include his wife of 5 yrs leaving him and he says the psych meds aren't working. He reports one inpatient placement in [place] at unknown date. When writer asks pt about self harm, pt says, " I break my brain, just start stressing." He endorses moderate anxiety, guilt, sadness, tearfulness, hopelessness and decreased appetite (he reports weight fluctuates). He reports short term memory impairment and decreased concentration. He says his father has been dx with bipolar disorder and his dad has one prior suicide attempt. Pt endorses AH. He says the voices have conversations with each other, and the voices "get on my nerves."  Pt denies homicidal thoughts or physical aggression. Pt denies having access to firearms. He reports court date 02/16/17 for driving vehicle without insurance. Pt is calm and cooperative during assessment. Pt denies any current or past substance abuse problems. Pt does not appear to be intoxicated or in withdrawal at this time. He reports he stayed last night with is estranged wife but she won't let him live with her. "  Today on 01/07/2017 pt seen and chart reviewed for evaluation as above. Pt has been cooperative in ED.   Past Psychiatric History: schizoaffective disorder  Risk to Self: None Risk to Others: None Prior Inpatient Therapy: Prior Inpatient Therapy: Yes Prior Therapy Dates: unknown Prior Therapy Facilty/Provider(s): in [place] Reason for Treatment: schizophrenia Prior Outpatient Therapy: Prior Outpatient Therapy: Yes Prior Therapy Dates: currently Prior Therapy Facilty/Provider(s): [facility] - Dr   Headen  Reason for Treatment: med management for schizophrenia Does patient have an ACCT team?: No Does patient have Intensive In-House Services?  : No Does patient have [facility] services? : No Does patient have P4CC services?:  Unknown  Past Medical History:  Past Medical History:  Diagnosis Date  . Anxiety   . Depression   . Hyperlipemia   . Hypertension   . Obesity   . Schizo affective schizophrenia (HCC)   . Sleep apnea     Past Surgical History:  Procedure Laterality Date  . VASECTOMY     Family History:  Family History  Problem Relation Age of Onset  . Heart attack Mother   . Cancer Mother   . Cancer Sister    Family Psychiatric  History: none Social History:  History  Alcohol Use No     History  Drug Use No    Social History   Social History  . Marital status: Married    Spouse name: N/A  . Number of children: 1  . Years of education: 11   Occupational History  . [employer]    Social History Main Topics  . Smoking status: Never Smoker  . Smokeless tobacco: Never Used  . Alcohol use No  . Drug use: No  . Sexual activity: Not Asked   Other Topics Concern  . None   Social History Narrative   Drinks caffeine daily    Additional Social History:    Allergies:  No Known Allergies  Labs:  Results for orders placed or performed during the hospital encounter of 01/06/17 (from the past 48 hour(s))  Urine rapid drug screen (hosp performed)     Status: None   Collection Time: 01/06/17  7:54 AM  Result Value Ref Range   Opiates NONE DETECTED NONE DETECTED   Cocaine NONE DETECTED NONE DETECTED   Benzodiazepines NONE DETECTED NONE DETECTED   Amphetamines NONE DETECTED NONE DETECTED   Tetrahydrocannabinol NONE DETECTED NONE DETECTED   Barbiturates NONE DETECTED NONE DETECTED    Comment:        DRUG SCREEN FOR MEDICAL PURPOSES ONLY.  IF CONFIRMATION IS NEEDED FOR ANY PURPOSE, NOTIFY LAB WITHIN 5 DAYS.        LOWEST DETECTABLE LIMITS FOR URINE DRUG SCREEN Drug Class       Cutoff (ng/mL) Amphetamine      1000 Barbiturate      200 Benzodiazepine   200 Tricyclics       300 Opiates          300 Cocaine          300 THC              50   Comprehensive metabolic panel      Status: Abnormal   Collection Time: 01/06/17  9:12 AM  Result Value Ref Range   Sodium 138 135 - 145 mmol/L   Potassium 4.0 3.5 - 5.1 mmol/L   Chloride 104 101 - 111 mmol/L   CO2 25 22 - 32 mmol/L   Glucose, Bld 109 (H) 65 - 99 mg/dL   BUN 6 6 - 20 mg/dL   Creatinine, Ser 1.06 0.61 - 1.24 mg/dL   Calcium 9.1 8.9 - 10.3 mg/dL   Total Protein 7.2 6.5 - 8.1 g/dL   Albumin 3.6 3.5 - 5.0 g/dL   AST 36 15 - 41 U/L   ALT 34 17 - 63 U/L   Alkaline Phosphatase 76 38 - 126 U/L   Total Bilirubin 0.2 (  L) 0.3 - 1.2 mg/dL   GFR calc non Af Amer >60 >60 mL/min   GFR calc Af Amer >60 >60 mL/min    Comment: (NOTE) The eGFR has been calculated using the CKD EPI equation. This calculation has not been validated in all clinical situations. eGFR's persistently <60 mL/min signify possible Chronic Kidney Disease.    Anion gap 9 5 - 15  Troponin I     Status: None   Collection Time: 01/06/17  9:12 AM  Result Value Ref Range   Troponin I <0.03 <0.03 ng/mL  Valproic acid level     Status: Abnormal   Collection Time: 01/06/17  9:12 AM  Result Value Ref Range   Valproic Acid Lvl 14 (L) 50.0 - 100.0 ug/mL  Brain natriuretic peptide     Status: None   Collection Time: 01/06/17  9:13 AM  Result Value Ref Range   B Natriuretic Peptide 16.6 0.0 - 100.0 pg/mL  Ethanol     Status: None   Collection Time: 01/06/17 10:22 AM  Result Value Ref Range   Alcohol, Ethyl (B) <5 <5 mg/dL    Comment:        LOWEST DETECTABLE LIMIT FOR SERUM ALCOHOL IS 5 mg/dL FOR MEDICAL PURPOSES ONLY     Current Facility-Administered Medications  Medication Dose Route Frequency Provider Last Rate Last Dose  . clonazePAM (KLONOPIN) tablet 2 mg  2 mg Oral TID Nathan Pickering, MD   2 mg at 01/07/17 0904  . divalproex (DEPAKOTE ER) 24 hr tablet 500 mg  500 mg Oral BID Nathan Pickering, MD   500 mg at 01/07/17 0905  . lurasidone (LATUDA) tablet 60 mg  60 mg Oral Daily Nathan Pickering, MD   60 mg at 01/07/17 0904  .  pantoprazole (PROTONIX) EC tablet 40 mg  40 mg Oral Daily Nathan Pickering, MD   40 mg at 01/07/17 0904   Current Outpatient Prescriptions  Medication Sig Dispense Refill  . clonazePAM (KLONOPIN) 2 MG tablet Take 2 mg by mouth 3 (three) times daily.   0  . divalproex (DEPAKOTE ER) 500 MG 24 hr tablet Take 500 mg by mouth 2 (two) times daily.  1  . ibuprofen (ADVIL,MOTRIN) 200 MG tablet Take 200-400 mg by mouth every 4 (four) hours as needed for headache (for pain.).     . LATUDA 60 MG TABS Take 60 mg by mouth daily.  1  . omeprazole (PRILOSEC) 20 MG capsule Take 40 mg by mouth 2 (two) times daily.     . Paliperidone Palmitate (INVEGA SUSTENNA IM) Inject into the muscle every 3 (three) months. Patient receives at doctors office and does not know mg      Musculoskeletal: Strength & Muscle Tone: within normal limits Gait & Station: in bed Patient leans: N/A  Psychiatric Specialty Exam: Physical Exam  Constitutional: He is oriented to person, place, and time. He appears well-developed and well-nourished.  HENT:  Head: Normocephalic.  Neck: Normal range of motion.  Respiratory: Effort normal.  Musculoskeletal: Normal range of motion.  Neurological: He is alert and oriented to person, place, and time.  Psychiatric: He has a normal mood and affect. His speech is normal and behavior is normal. Judgment and thought content normal. Cognition and memory are normal.    Review of Systems  Psychiatric/Behavioral: Positive for depression, hallucinations and suicidal ideas. The patient is nervous/anxious and has insomnia.   All other systems reviewed and are negative.   Blood pressure 154/96, pulse 84, temperature 98.4   F (36.9 C), temperature source Oral, resp. rate 22, height 6' 4" (1.93 m), weight (!) 199.6 kg (440 lb), SpO2 100 %.Body mass index is 53.56 kg/m.  General Appearance: Casual and Fairly Groomed  Eye Contact:  Good  Speech:  Normal Rate  Volume:  Normal  Mood:  Dysphoric   Affect:  Congruent  Thought Process:  Coherent and Descriptions of Associations: Intact  Orientation:  Full (Time, Place, and Person)  Thought Content:  Asking to go home several times, minimizing symptoms  Suicidal Thoughts:  Minimizing although he stated >24h ago that he is suicidal   Homicidal Thoughts:  No  Memory:  Immediate;   Good Recent;   Good Remote;   Good  Judgement:  Good  Insight:  Good  Psychomotor Activity:  Normal  Concentration:  Concentration: Good and Attention Span: Good  Recall:  Good  Fund of Knowledge:  Good  Language:  Good  Akathisia:  No  Handed:  Right  AIMS (if indicated):     Assets:  Housing Leisure Time Physical Health Resilience Social Support  ADL's:  Intact  Cognition:  WNL  Sleep:        Treatment Plan Summary: Schizoaffective disorder, bipolar type (HCC) unstable, warrants inpatient psychiatric admission.   -Crisis stabilization -Continue Latuda 60 mg daily for mood, Depakote 500 mg BID for mood but not his Klonopin 2 mg TID for anxiety. -Individual counseling  Disposition: Recommend psychiatric Inpatient admission when medically cleared. IVC if necessary  Withrow, John C, FNP 01/07/2017 10:47 AM   Agree with NP assessment 

## 2017-01-07 NOTE — ED Notes (Signed)
Pt given Diet Coke x 2 as requested for snack. Pt ate sandwich given earlier.

## 2017-01-07 NOTE — ED Notes (Addendum)
Pt asking if he can go. States he feels better and wants to leave. Denies SI/HI. Advised pt [facility] and [facility]. Pt continues to state he wants to leave now d/t he is used to being up all night and eating/drinking what he wants to and does not like the rules we have. Requested for pt to please wait until after speaks w/[facility] counselor/NP. Also advised pt if he attempts to leave, he may be IVC'd by EDP. Gave pt Caff-Free Coke as requested and sandwiches x 2 while waiting for breakfast. Pt states he will wait until lunch time.

## 2017-01-07 NOTE — ED Notes (Signed)
IVC papers faxed to Magistrate - verified received. 

## 2017-01-07 NOTE — ED Notes (Signed)
Dr Criss AlvineGoldston advised pt of tx plan - inpt tx recommended. Pt appearing upset and stating he cannot stay and that he needs to go to his job. States he feels fine and will start taking his medications as he is supposed to. Dr Criss AlvineGoldston advised pt going thru w/inpt. Pt called his spouse on phone at desk. Security standing by. Dr Criss AlvineGoldston completed IVC papers.

## 2017-01-07 NOTE — ED Notes (Signed)
Decaf coffee given to pt as requested. Voiced understanding will need to wait until lunch for additional soda/coffee - may have water.

## 2017-01-07 NOTE — ED Notes (Signed)
Sitting on bed watching tv w/sitter.

## 2017-01-07 NOTE — ED Notes (Signed)
Pt asked for a diet coke. RN explained to pt that he could have water. Pt given water

## 2017-01-07 NOTE — ED Notes (Signed)
[facility] Dr Elna BreslowEappen after 2000 tonight.

## 2017-01-07 NOTE — ED Notes (Signed)
Pt aware RN spoke w/Krist[facility], [facility], who advised she will ask NP to assess pt this am.

## 2017-01-07 NOTE — ED Notes (Signed)
Allen Sosa, AC, [facility] - [facility]. Pt aware and is in agreement w/tx plan. Pt calling spouse to advise her of tx plan.

## 2017-01-07 NOTE — ED Notes (Signed)
Relieving sitter for lunch break; patient appears to be asleep with eyes closed laying on bed

## 2017-01-07 NOTE — ED Provider Notes (Signed)
11:04 AM D/w Renata Capriceonrad from psych. Wife notes the patient has been acting very bizarre, is not taking his medicines with any type of regularity, and has to be remembered to base and clean himself. Has been referencing suicide. At this point he does not appear to have understanding of his disease either. He does not want to stay voluntarily but meets inpatient criteria. He will be involuntarily committed given I believe he is having psychosis and some suicidal thoughts.   Pricilla LovelessScott Ceasia Elwell, MD 01/07/17 1105

## 2017-01-07 NOTE — ED Notes (Signed)
Diet Coke given as requested for snack.

## 2017-01-07 NOTE — ED Notes (Signed)
IVC papers served - original placed in folder for [facility] Incm[facility]istrate, copy sent to medical records, [facility] and ALL 3 copies placed on clipboard.

## 2017-01-07 NOTE — ED Notes (Signed)
Pt given water as requested.

## 2017-01-07 NOTE — ED Notes (Signed)
Pt given Caff-Free Diet Coke as requested. Aware snack time is at 1500.

## 2017-01-08 ENCOUNTER — Inpatient Hospital Stay (HOSPITAL_COMMUNITY)
Admission: AD | Admit: 2017-01-08 | Discharge: 2017-01-13 | DRG: 885 | Disposition: A | Discharge status: Home / Self Care | Payer: Medicaid Other | Source: Intra-hospital | Attending: Psychiatry | Admitting: Psychiatry

## 2017-01-08 ENCOUNTER — Encounter (HOSPITAL_COMMUNITY): Payer: Self-pay

## 2017-01-08 DIAGNOSIS — F250 Schizoaffective disorder, bipolar type: Secondary | ICD-10-CM | POA: Diagnosis present

## 2017-01-08 DIAGNOSIS — I10 Essential (primary) hypertension: Secondary | ICD-10-CM | POA: Diagnosis present

## 2017-01-08 DIAGNOSIS — Z6841 Body Mass Index (BMI) 40.0 and over, adult: Secondary | ICD-10-CM | POA: Diagnosis not present

## 2017-01-08 DIAGNOSIS — Z915 Personal history of self-harm: Secondary | ICD-10-CM

## 2017-01-08 DIAGNOSIS — K219 Gastro-esophageal reflux disease without esophagitis: Secondary | ICD-10-CM | POA: Diagnosis present

## 2017-01-08 DIAGNOSIS — F419 Anxiety disorder, unspecified: Secondary | ICD-10-CM | POA: Diagnosis present

## 2017-01-08 DIAGNOSIS — Z23 Encounter for immunization: Secondary | ICD-10-CM | POA: Diagnosis not present

## 2017-01-08 DIAGNOSIS — Z79899 Other long term (current) drug therapy: Secondary | ICD-10-CM | POA: Diagnosis not present

## 2017-01-08 DIAGNOSIS — R45850 Homicidal ideations: Secondary | ICD-10-CM | POA: Diagnosis present

## 2017-01-08 DIAGNOSIS — G4730 Sleep apnea, unspecified: Secondary | ICD-10-CM | POA: Diagnosis present

## 2017-01-08 DIAGNOSIS — Z818 Family history of other mental and behavioral disorders: Secondary | ICD-10-CM

## 2017-01-08 DIAGNOSIS — Z808 Family history of malignant neoplasm of other organs or systems: Secondary | ICD-10-CM | POA: Diagnosis not present

## 2017-01-08 DIAGNOSIS — Z8249 Family history of ischemic heart disease and other diseases of the circulatory system: Secondary | ICD-10-CM | POA: Diagnosis not present

## 2017-01-08 DIAGNOSIS — E669 Obesity, unspecified: Secondary | ICD-10-CM | POA: Diagnosis present

## 2017-01-08 DIAGNOSIS — F4325 Adjustment disorder with mixed disturbance of emotions and conduct: Secondary | ICD-10-CM | POA: Diagnosis present

## 2017-01-08 DIAGNOSIS — R45851 Suicidal ideations: Secondary | ICD-10-CM | POA: Diagnosis present

## 2017-01-08 MED ORDER — LURASIDONE HCL 20 MG PO TABS
60.0000 mg | ORAL_TABLET | Freq: Every day | ORAL | Status: DC
  Filled 2017-01-08 (×4): qty 3

## 2017-01-08 MED ORDER — ALUM & MAG HYDROXIDE-SIMETH 200-200-20 MG/5ML PO SUSP
30.0000 mL | ORAL | Status: DC | PRN
  Administered 2017-01-10: 30 mL via ORAL
  Filled 2017-01-08: qty 30

## 2017-01-08 MED ORDER — CLONAZEPAM 1 MG PO TABS
1.0000 mg | ORAL_TABLET | Freq: Two times a day (BID) | ORAL | Status: DC
  Administered 2017-01-08 – 2017-01-09 (×2): 1 mg via ORAL
  Filled 2017-01-08 (×2): qty 1

## 2017-01-08 MED ORDER — LURASIDONE HCL 40 MG PO TABS
ORAL_TABLET | ORAL | Status: AC
  Filled 2017-01-08: qty 2

## 2017-01-08 MED ORDER — PANTOPRAZOLE SODIUM 40 MG PO TBEC
40.0000 mg | DELAYED_RELEASE_TABLET | Freq: Every day | ORAL | Status: DC
  Administered 2017-01-08 – 2017-01-13 (×5): 40 mg via ORAL
  Filled 2017-01-08 (×9): qty 1

## 2017-01-08 MED ORDER — MAGNESIUM HYDROXIDE 400 MG/5ML PO SUSP: 30.0000 mL | Freq: Every day | ORAL | Status: DC | PRN

## 2017-01-08 MED ORDER — DIVALPROEX SODIUM ER 500 MG PO TB24
500.0000 mg | ORAL_TABLET | Freq: Two times a day (BID) | ORAL | Status: DC
  Filled 2017-01-08 (×7): qty 1

## 2017-01-08 MED ORDER — INFLUENZA VAC SPLIT QUAD 0.5 ML IM SUSY
0.5000 mL | PREFILLED_SYRINGE | INTRAMUSCULAR | Status: AC
  Administered 2017-01-09: 0.5 mL via INTRAMUSCULAR
  Filled 2017-01-08: qty 0.5

## 2017-01-08 MED ORDER — TRAZODONE HCL 50 MG PO TABS
50.0000 mg | ORAL_TABLET | Freq: Every evening | ORAL | Status: DC | PRN
Start: 2017-01-08 — End: 2017-01-11
  Filled 2017-01-08 (×2): qty 1

## 2017-01-08 NOTE — ED Notes (Signed)
Pt states that [facility] because he is not a little kid and does not need to be treated for a mental illness

## 2017-01-08 NOTE — ED Notes (Signed)
Talked with [facility], waiting on a physical bed big enough for pt , I have updated the pt ,

## 2017-01-08 NOTE — Tx Team (Signed)
Initial Treatment Plan 01/08/2017 3:46 PM Alwyn RenMichael Mast V. WUJ:[id number]    PATIENT STRESSORS: Marital or family conflict Other: [facility] he didn't feel like his medications were working appropriately   PATIENT STRENGTHS: Capable of independent living MetallurgistCommunication skills Financial means General fund of knowledge Motivation for treatment/growth Work skills   PATIENT IDENTIFIED PROBLEMS: Depression  Suicidal ideation  Psychosis  "I would like to go home in three days"  "I would like to have peace of mind"             DISCHARGE CRITERIA:  Improved stabilization in mood, thinking, and/or behavior Verbal commitment to aftercare and medication compliance  PRELIMINARY DISCHARGE PLAN: Outpatient therapy Medication management  PATIENT/FAMILY INVOLVEMENT: This treatment plan has been presented to and reviewed with the patient, Alwyn RenMichael Hursey V.  The patient and family have been given the opportunity to ask questions and make suggestions.  Levin BaconHeather V Timaya Bojarski, RN 01/08/2017, 3:46 PM

## 2017-01-08 NOTE — Progress Notes (Signed)
[facility] Report can be called to #[id number], attending MD Dr. Elna BreslowEappen.  Notified [facility] pt can now be transferred.  Ilean SkillMeghan Chavez Rosol, MSW, LCSW Clinical Social Work, Disposition  01/08/2017 [phone]

## 2017-01-08 NOTE — ED Triage Notes (Signed)
[facility]

## 2017-01-08 NOTE — Progress Notes (Signed)
Patient ID: Allen RenMichael Castellana V., male   DOB: 03/23/1981, 36 y.o.   MRN: [id number]  D: Patient in dayroom watching TV and interacting with peers. Pt reports he feels great and hoping to discharge soon. Pt decided to talk to provider before starting on his medication tonight. Pt believes if he takes anything tonight he will be forced to stay on but if he is able to talk to the provider tomorrow his medications can be changed and he can be started on a different regimen. Pt endorses AH without command. Pt denies SI/HI and pain.No behavioral issues noted.  A: Support and encouragement offered as needed. Pt encouraged to klonopin for  anxiety.  R: Patient is safe on the unit. Will continue to monitor patient for safety and stability.

## 2017-01-08 NOTE — ED Notes (Signed)
Declined W/C at D/C and was escorted to lobby by RN. [facility]

## 2017-01-08 NOTE — Progress Notes (Signed)
Adult Psychoeducational Group Note  Date:  01/08/2017 Time:  11:28 PM  Group Topic/Focus:  Wrap-Up Group:   The focus of this group is to help patients review their daily goal of treatment and discuss progress on daily workbooks.   Participation Level:  Active  Participation Quality:  Appropriate  Affect:  Appropriate  Cognitive:  Alert  Insight: Appropriate  Engagement in Group:  Engaged  Modes of Intervention:  Discussion  Additional Comments:  Patient states, "my day was perfect". Patient also states, "When I discharge, I want to leave with a peaceful mind. Allen Sosa 01/08/2017, 11:28 PM

## 2017-01-08 NOTE — Progress Notes (Signed)
Casimiro NeedleMichael is a 36 year old male being admitted invol[facility]arily [facility].  He presented to the ED with suicidal ideation and a plan to OD or walk out in traffic.  He reported altercation with wife and that she was leaving him.  He also voiced that he hears voices but they are like conversations.  He currently SI/HI but continues to hear voices, "they are like conversations in my head, they don't tell me to hurt myself or anyone else."    He stated, [facility], that "I am feeling better and realized that I need to listen to my wife more."  He works full time and is hoping to get back to work soon.  He denies SI/HI at this time.  He denies any medical issues and appears to be in no physical distress.  He denies any pain or discomfort.  Oriented him to the unit.  Admission paperwork completed and signed.  Belongings searched and secured in locker # 8 (he has belongings behind the curtain in the search room).  Skin assessment completed and noted multiple tattoos and dry skin on back, legs and feet.  Q 15 minute checks initiated for safety.  We will monitor the progress towards his goals.

## 2017-01-08 NOTE — ED Triage Notes (Signed)
[facility] ,[facility] to [facility]. Lanora Manislizabeth reported she needs to review Pt with the AD . Lanora Manislizabeth reported she will call back .

## 2017-01-08 NOTE — ED Notes (Signed)
Patient was given 2 cokes with ice.

## 2017-01-08 NOTE — ED Notes (Signed)
Patient was given a snack and drink during Snack time, and a regular diet was ordered for lunch.[ Double Portions was ordered.]

## 2017-01-09 ENCOUNTER — Encounter (HOSPITAL_COMMUNITY): Payer: Self-pay | Admitting: Family Medicine

## 2017-01-09 DIAGNOSIS — Z8249 Family history of ischemic heart disease and other diseases of the circulatory system: Secondary | ICD-10-CM

## 2017-01-09 DIAGNOSIS — Z808 Family history of malignant neoplasm of other organs or systems: Secondary | ICD-10-CM

## 2017-01-09 DIAGNOSIS — I10 Essential (primary) hypertension: Secondary | ICD-10-CM | POA: Diagnosis present

## 2017-01-09 DIAGNOSIS — F4325 Adjustment disorder with mixed disturbance of emotions and conduct: Secondary | ICD-10-CM

## 2017-01-09 DIAGNOSIS — E669 Obesity, unspecified: Secondary | ICD-10-CM

## 2017-01-09 DIAGNOSIS — Z79899 Other long term (current) drug therapy: Secondary | ICD-10-CM

## 2017-01-09 LAB — LIPID PANEL
CHOL/HDL RATIO: 5 ratio
Cholesterol: 198 mg/dL (ref 0–200)
HDL: 40 mg/dL — ABNORMAL LOW (ref >40)
LDL CALC: 132 mg/dL — AB (ref 0–99)
TRIGLYCERIDES: 128 mg/dL (ref <150)
VLDL: 26 mg/dL (ref 0–40)

## 2017-01-09 LAB — TSH: TSH: 1.974 u[IU]/mL (ref 0.350–4.500)

## 2017-01-09 LAB — COMPREHENSIVE METABOLIC PANEL
ALBUMIN: 4 g/dL (ref 3.5–5.0)
ALT: 31 U/L (ref 17–63)
AST: 29 U/L (ref 15–41)
Alkaline Phosphatase: 84 U/L (ref 38–126)
Anion gap: 9 (ref 5–15)
BILIRUBIN TOTAL: 0.7 mg/dL (ref 0.3–1.2)
BUN: 13 mg/dL (ref 6–20)
CHLORIDE: 101 mmol/L (ref 101–111)
CO2: 26 mmol/L (ref 22–32)
Calcium: 9 mg/dL (ref 8.9–10.3)
Creatinine, Ser: 1.07 mg/dL (ref 0.61–1.24)
GFR calc Af Amer: >60 mL/min (ref >60)
GFR calc non Af Amer: >60 mL/min (ref >60)
GLUCOSE: 102 mg/dL — AB (ref 65–99)
POTASSIUM: 3.9 mmol/L (ref 3.5–5.1)
Sodium: 136 mmol/L (ref 135–145)
Total Protein: 7.3 g/dL (ref 6.5–8.1)

## 2017-01-09 LAB — BILIRUBIN, DIRECT: Bilirubin, Direct: <0.1 mg/dL — ABNORMAL LOW (ref 0.1–0.5)

## 2017-01-09 MED ORDER — CLONAZEPAM 1 MG PO TABS
1.0000 mg | ORAL_TABLET | Freq: Three times a day (TID) | ORAL | Status: DC
  Administered 2017-01-09 – 2017-01-13 (×12): 1 mg via ORAL
  Filled 2017-01-09 (×12): qty 1

## 2017-01-09 MED ORDER — HYDRALAZINE HCL 25 MG PO TABS: 25.0000 mg | ORAL_TABLET | Freq: Three times a day (TID) | ORAL | Status: DC

## 2017-01-09 MED ORDER — HYDRALAZINE HCL 10 MG PO TABS
10.0000 mg | ORAL_TABLET | Freq: Three times a day (TID) | ORAL | Status: DC
  Administered 2017-01-09 – 2017-01-13 (×12): 10 mg via ORAL
  Filled 2017-01-09 (×19): qty 1

## 2017-01-09 NOTE — Progress Notes (Signed)
DAR NOTE: Patient presents with anxious affect and depressed mood.  Denies pain, auditory and visual hallucinations.  Described energy level as normal and concentration as good.  Rates depression at 3, hopelessness at 0, and anxiety at 1.  Maintained on routine safety checks.  Medications given as prescribed.  Support and encouragement offered as needed.  Attended group and participated.  States goal for today is "go home."  Patient observed socializing with peers in the dayroom.  High BP reading reported to MD.

## 2017-01-09 NOTE — [facility] Suicide Risk Assessment (Signed)
[facility]   Nursing information obtained from:  Patient/chart  Demographic factors:  36 year old male, married Current Mental Status:   See below  Loss Factors:  Chronic mental illness , disability, recent argument with spouse  Historical Factors:  History of Schizoaffective Disorder, reports history of remote psychiatric admissions but none in more than ten years  Risk Reduction Factors: sense of responsibility to family  Total Time spent with patient: 45 minutes  Principal Problem:  Schizoaffective Disorder  Diagnosis:   Patient Active Problem List   Diagnosis Date Noted  . Schizoaffective disorder, bipolar type (HCC) [F25.0] 01/07/2017  . 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 Abdominal pain [R10.9] 11/20/2014  . Abdominal pain in male [R10.9] 11/18/2014    Continued Clinical Symptoms:  Alcohol Use Disorder Identification Test Final Score (AUDIT): 0 The "Alcohol Use Disorders Identification Test", Guidelines for Use in Primary Care, Second Edition.  World Science writerHealth Organization [facility](WHO). Score between 0-7:  no or low risk or alcohol related problems. Score between 8-15:  moderate risk of alcohol related problems. Score between 16-19:  high risk of alcohol related problems. Score 20 or above:  warrants further diagnostic evaluation for alcohol dependence and treatment.   CLINICAL FACTORS:  36 year old married male. Reports long history of mental illness, has been diagnosed with Schizoaffective Disorder. Reports he had several psychiatric admissions in his 20's, but had been more stable over the last several years, on [place]Klonopin and Invega Sustenna injectable. States he does not feel he has been decompensating recently, but does report chronic and persistent hallucinations. He recently made threats to blow up the home while arguing with his wife , but states " I did not mean it and regret having said those things  ".   Psychiatric Specialty Exam: Physical Exam  ROS  Blood pressure (!) 172/100, pulse 92, temperature 98.5 F (36.9 C), temperature source Oral, resp. rate 17, height 6' 1.5" (1.867 m), weight (!) 199.6 kg (440 lb), SpO2 99 %.Body mass index is 57.26 kg/m.   see admit note [facility]     COGNITIVE FEATURES THAT CONTRIBUTE TO RISK:  Closed-mindedness and Loss of executive function    SUICIDE RISK:  Moderate   PLAN OF CARE: Patient will be admitted to inpatient psychiatric unit for stabilization and safety. Will provide and encourage milieu participation. Provide medication management and maked adjustments as needed.  Will follow daily.    I certify that inpatient services furnished can reasonably be expected to improve the patient's condition.  Nehemiah MassedOBOS, FERNANDO, MD 01/09/2017, 4:58 PM

## 2017-01-09 NOTE — [facility] Group Notes (Signed)
[facility] LCSW Group Therapy  01/09/2017 3:52 PM   Type of Therapy:  Group Therapy  Participation Level:  Active  Participation Quality:  Attentive  Affect:  Appropriate  Cognitive:  Appropriate  Insight:  Improving  Engagement in Therapy:  Engaged  Modes of Intervention:  Clarification, Education, Exploration and Socialization  Summary of Progress/Problems: Today's group focused on resilience.  Stayed the entire time, engaged throughout.  Initially could not identify any examples for himself, but with help, recounted how, when diagnosed at the age of 36 with Bipolar disorder due to severe symptoms and resulting 7 month hospitalization, "I thought my life was over.  It was awful.  But I got through it, and my llfe is good now.  I have a lot to be grateful for."  Cited the encouragement and support of his mother as what got him through. Daryel Geraldorth, Rebacca Votaw B 01/09/2017 , 3:52 PM

## 2017-01-09 NOTE — Consult Note (Signed)
  Consult Note   Allen RenMichael Reasoner V. ZOX:[id number] DOB: 1981/04/23 DOA: 01/08/2017  Referring MD/NP/PA: Dr. Jama Flavorsobos PCP: No PCP Per Patient  HPI: Allen RenMichael Vanderhoef V. is a 36 y.o. male [facility] with Dx schizoaffective disorder. He was found to be severely hypertensive on admission with BP's reaching 170s/110s. He has a history of HTN in the EMR but has not taken any medications for this. He is asymptomatic. [facility] asked to assist with management of hypertension.   Objective: Patient not examined.  Vitals:   01/08/17 1400 01/08/17 1405 01/09/17 0555 01/09/17 0556  BP: (!) 168/95 (!) 167/113 (!) 157/104 (!) 172/100  Pulse: 87 (!) 104 76 92  Resp: 18  17   Temp: 98.5 F (36.9 C)  98.5 F (36.9 C)   TempSrc: Oral  Oral   SpO2: 99%     Weight: (!) 199.6 kg (440 lb)     Height: 6' 1.5" (1.867 m)       Recent Labs Lab 01/05/17 0339  WBC 8.5  HGB 12.6*  HCT 35.8*  MCV 82.5  PLT 194    Recent Labs Lab 01/05/17 0339 01/06/17 0912 01/09/17 0640  NA 137 138 136  K 3.6 4.0 3.9  CL 104 104 101  CO2 25 25 26   GLUCOSE 112* 109* 102*  BUN 11 6 13   CREATININE 1.05 1.06 1.07  CALCIUM 8.9 9.1 9.0   Assessment/Plan Active Problems:   Schizoaffective disorder, bipolar type (HCC)   Hypertension   Obesity   Hypertension: Near severe range BPs on admission. Looking at recent visits to ED, he has ranged 143-181/84-101. Presuming this is near chronic baseline, perhaps worsening due to stress. Asymptomatic. Recommend conservative lowering of BP to avoid orthostatic symptoms. No compelling indications for ACE/ARB or beta blocker. NKDA. - Hydralazine 10mg  po TID - No indication that PRN doses will be necessary as goal inpatient BP would be <180/100. If consistently above this, would recommend addition hydralazine 10mg  po q6h prn SBP > 180mmHg or DBP > 100mmHg.  - Please call if any further needs arise.  Schizoaffective disorder: Per primary  Hazeline Junkeryan Hiliana Eilts,  MD [facility] Pager [phone]  If 7PM-7AM, please contact night-coverage [email or web address] Password [email or web address] 01/09/2017, 5:57 PM

## 2017-01-09 NOTE — Plan of Care (Signed)
Problem: Safety: Goal: Periods of time without injury will increase Outcome: Progressing Pt. remains a low fall risk, denies SI/HI/VH/Pain at this time, endorses AH; not command, Q 15 checks in place for safety.

## 2017-01-09 NOTE — H&P (Addendum)
Psychiatric Admission Assessment Adult  Patient Identification: Allen Sosa MRN:  [id number] Date of Evaluation:  01/09/2017 Chief Complaint:   " I made some threats, I should not have done it" Principal Diagnosis:  Schizoaffective Disorder by history  Diagnosis:   Patient Active Problem List   Diagnosis Date Noted  . Schizoaffective disorder, bipolar type ([place]) [F25.0] 01/07/2017  . 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 Abdominal pain [R10.9] 11/20/2014  . Abdominal pain in male [R10.9] 11/18/2014   History of Present Illness:  36 year old male. Has been diagnosed with Schizoaffective Disorder in the past . Reports he has been stable for a period of years, on a combination of [place] injectable, which he gets every three months, and on Klonopin. His last Invega injection was two and half months ago, but is unsure of exact date. States he had been doing well recently, until a recent episode of anger. States  " I made threats, which I should not have made, I regret having said those things, I did not mean it". States " because I have a mental illness , they took what I said seriously". States the above occurred in the context of an argument with his wife because of another male making comments on his facebook page. He states that during the argument he got angry and made threats of " blowing up the home". Reports he regretted making these threats as soon as he had made them and reports  " I myself called 911". He states " really,  I had been OK", and denies any recent depression or increased irritability, anger. Of note, patient does state he has come to ED twice  over recent weeks for " just being stressed over money "- was not admitted .   Associated Signs/Symptoms: Depression Symptoms:   Does not currently endorse symptoms of depression - no anhedonia, states sleep, appetite , energy all within normal. (Hypo) Manic Symptoms:   Does not endorse  Anxiety Symptoms:  Reports recent anxiety episodes. Psychotic Symptoms:  States he has chronic auditory hallucinations, dating back to his 57's- describes as persistent " mumblings", states he cannot make out what they say. Denies any command hallucinations. Does not appear internally preoccupied at this time.  PTSD Symptoms: Denies  Total Time spent with patient: 45 minutes  Past Psychiatric History: history of schizoaffective disorder, states he has had multiple psychiatric admissions , mostly in his 20's , but states he has not required any psychiatric admissions for about 10 years. He reports chronic, persistent auditory hallucinations, but states that they are unintelligible and distant with medications . History of suicide attempt by cutting wrists at age [age].  Of note, denies history of violence    Is the patient at risk to self? Yes.    Has the patient been a risk to self in the past 6 months? No.  Has the patient been a risk to self within the distant past? Yes.    Is the patient a risk to others? Yes.    Has the patient been a risk to others in the past 6 months? No.  Has the patient been a risk to others within the distant past? No.   Prior Inpatient Therapy:  as above  Prior Outpatient Therapy:  Sees Dr. Rosine Door , at [facility]   Alcohol Screening: 1. How often do you have a drink containing alcohol?: Never 9. Have you or someone else been injured as  a result of your drinking?: No 10. Has a relative or friend or a doctor or another health worker been concerned about your drinking or suggested you cut down?: No Alcohol Use Disorder Identification Test Final Score (AUDIT): 0 Brief Intervention: AUDIT score less than 7 or less-screening does not suggest unhealthy drinking-brief intervention not indicated Substance Abuse History in the last 12 months:  Denies drug or alcohol abuse .  Consequences of Substance Abuse: Denies  Previous Psychotropic  Medications: Was taking Klonopin 2 mgrs BID, [place]  Q 3 months, states he is unsure when he last got dose, but states it was about two and  A half months ago. In  the past had been on Latuda, Depakote ER, but they had been discontinued by his outpatient psychiatrist  Psychological Evaluations:  No  Past Medical History:  Past Medical History:  Diagnosis Date  . Anxiety   . Depression   . Hyperlipemia   . Hypertension   . Obesity   . Schizo affective schizophrenia ([place])   . Sleep apnea     Past Surgical History:  Procedure Laterality Date  . VASECTOMY     Family History:  Father alive, mother passed away from CVA, has three sisters  Family History  Problem Relation Age of Onset  . Heart attack Mother   . Cancer Mother   . Cancer Sister    Family Psychiatric  History: biological father has history of Bipolar Disorder, mother had history of alcohol abuse  Tobacco Screening: Have you used any form of tobacco in the last 30 days? 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, on SSI, denies legal issues History  Alcohol Use No     History  Drug Use No    Additional Social History: Marital status: (P) Married Number of Years Married: (P) 6.5 What types of issues is patient dealing with in the relationship?: (P) finances are adding stress Are you sexually active?: (P) Yes What is your sexual orientation?: (P) straight Does patient have children?: (P) Yes How many children?: (P) 12 How is patient's relationship with their children?: (P) step son    Pain Medications: pt denies abuse - see pta meds list Prescriptions: pt denies abuse - see pta meds list Over the Counter: pt denies abuse - see pta meds list History of alcohol / drug use?: No history of alcohol / drug abuse Longest period of sobriety (when/how long):  n/a  Allergies:  No Known Allergies Lab Results:  Results for orders placed or performed during the hospital encounter of 01/08/17 (from the past 48 hour(s))  TSH     Status: None   Collection Time: 01/09/17  6:11 AM  Result Value Ref Range   TSH 1.974 0.350 - 4.500 uIU/mL    Comment: Performed by a 3rd Generation assay with a functional sensitivity of <=0.01 uIU/mL. Performed at [facility]   Comprehensive metabolic panel     Status: Abnormal   Collection Time: 01/09/17  6:40 AM  Result Value Ref Range   Sodium 136 135 - 145 mmol/L   Potassium 3.9 3.5 - 5.1 mmol/L   Chloride 101 101 - 111 mmol/L   CO2 26 22 - 32 mmol/L   Glucose, Bld 102 (H) 65 - 99 mg/dL   BUN 13 6 - 20 mg/dL   Creatinine, Ser 1.07 0.61 - 1.24  mg/dL   Calcium 9.0 8.9 - 10.3 mg/dL   Total Protein 7.3 6.5 - 8.1 g/dL   Albumin 4.0 3.5 - 5.0 g/dL   AST 29 15 - 41 U/L   ALT 31 17 - 63 U/L   Alkaline Phosphatase 84 38 - 126 U/L   Total Bilirubin 0.7 0.3 - 1.2 mg/dL   GFR calc non Af Amer >60 >60 mL/min   GFR calc Af Amer >60 >60 mL/min    Comment: (NOTE) The eGFR has been calculated using the CKD EPI equation. This calculation has not been validated in all clinical situations. eGFR's persistently <60 mL/min signify possible Chronic Kidney Disease.    Anion gap 9 5 - 15    Comment: Performed at [facility]  Bilirubin, direct     Status: Abnormal   Collection Time: 01/09/17  6:40 AM  Result Value Ref Range   Bilirubin, Direct <0.1 (L) 0.1 - 0.5 mg/dL    Comment: Performed at [facility]  Lipid panel     Status: Abnormal   Collection Time: 01/09/17  6:40 AM  Result Value Ref Range   Cholesterol 198 0 - 200 mg/dL   Triglycerides 128 <150 mg/dL   HDL 40 (L) >40 mg/dL   Total CHOL/HDL Ratio 5.0 RATIO   VLDL 26 0 - 40 mg/dL   LDL Cholesterol 132 (H) 0 - 99 mg/dL    Comment:        Total Cholesterol/HDL:CHD Risk Coronary Heart Disease Risk Table                      Men   Women  1/2 Average Risk   3.4   3.3  Average Risk       5.0   4.4  2 X Average Risk   9.6   7.1  3 X Average Risk  23.4   11.0        Use the calculated Patient Ratio above and the CHD Risk Table to determine the patient's CHD Risk.        ATP III CLASSIFICATION (LDL):  <100     mg/dL   Optimal  100-129  mg/dL   Near or Above                    Optimal  130-159  mg/dL   Borderline  160-189  mg/dL   High  >190     mg/dL   Very High Performed at [facility]     Blood Alcohol level:  Lab Results  Component Value Date   [facility] <5 01/06/2017   ETH <5 78/24/2353    Metabolic Disorder Labs:  No results found for: HGBA1C, MPG No results found for: PROLACTIN Lab Results  Component Value Date   CHOL 198 01/09/2017   TRIG 128 01/09/2017   HDL 40 (L) 01/09/2017   CHOLHDL 5.0 01/09/2017   VLDL 26 01/09/2017   LDLCALC 132 (H) 01/09/2017    Current Medications: Current Facility-Administered Medications  Medication Dose Route Frequency Provider Last Rate Last Dose  . alum & mag hydroxide-simeth (MAALOX/MYLANTA) 200-200-20 MG/5ML suspension 30 mL  30 mL Oral Q4H PRN Benjamine Mola, FNP      . clonazePAM (KLONOPIN) tablet 1 mg  1 mg Oral BID Benjamine Mola, FNP   1 mg at 01/09/17 0809  . divalproex (DEPAKOTE ER) 24 hr tablet 500 mg  500 mg Oral BID Benjamine Mola, FNP      .  lurasidone (LATUDA) tablet 60 mg  60 mg Oral Daily [name], FNP      . magnesium hydroxide (MILK OF MAGNESIA) suspension 30 mL  30 mL Oral Daily PRN Benjamine Mola, FNP      . pantoprazole (PROTONIX) EC tablet 40 mg  40 mg Oral Daily Benjamine Mola, FNP   40 mg at 01/08/17 2030  . traZODone (DESYREL) tablet 50 mg  50 mg Oral QHS PRN Benjamine Mola, FNP       PTA Medications: Prescriptions Prior to Admission  Medication Sig Dispense Refill Last Dose  . clonazePAM (KLONOPIN) 2 MG tablet Take 2 mg by mouth 3 (three) times daily.   0 01/05/2017 at Unknown time  . divalproex  (DEPAKOTE ER) 500 MG 24 hr tablet Take 500 mg by mouth 2 (two) times daily.  1 01/05/2017 at Unknown time  . ibuprofen (ADVIL,MOTRIN) 200 MG tablet Take 200-400 mg by mouth every 4 (four) hours as needed for headache (for pain.).    Past Month at Unknown time  . LATUDA 60 MG TABS Take 60 mg by mouth daily.  1 01/05/2017 at Unknown time  . omeprazole (PRILOSEC) 20 MG capsule Take 40 mg by mouth 2 (two) times daily.    01/05/2017 at Unknown time  . Paliperidone Palmitate (INVEGA SUSTENNA IM) Inject into the muscle every 3 (three) months. Patient receives at doctors office and does not know mg   2 months ago at Unknown    Musculoskeletal: Strength & Muscle Tone: within normal limits Gait & Station: normal Patient leans: N/A  Psychiatric Specialty Exam: Physical Exam  Review of Systems  Constitutional: Negative.   HENT: Negative.   Eyes: Negative.   Respiratory: Negative.   Cardiovascular: Negative.   Gastrointestinal: Negative.   Genitourinary: Negative.   Musculoskeletal: Negative.   Skin: Negative.   Neurological: Negative for seizures.  Endo/Heme/Allergies: Negative.   Psychiatric/Behavioral: Positive for depression.    Blood pressure (!) 172/100, pulse 92, temperature 98.5 F (36.9 C), temperature source Oral, resp. rate 17, height 6' 1.5" (1.867 m), weight (!) 199.6 kg (440 lb), SpO2 99 %.Body mass index is 57.26 kg/m.  General Appearance: Fairly Groomed  Eye Contact:  Good  Speech:  Normal Rate  Volume:  Normal  Mood:  denies feeling depressed  Affect:  Appropriate, not constricted   Thought Process:  Linear  Orientation:  Full (Time, Place, and Person)  Thought Content:  denies hallucinations, no delusions expressed at this time, does not appear internally preoccupied   Suicidal Thoughts:  No denies any suicidal or self injurious ideations, denies any homicidal or violent ideations at this time, and at this time specifically denies any violent or homicidal ideations towards  wife .  Homicidal Thoughts:  No  Memory:  recent and remote grossly intact   Judgement:  Fair  Insight:  Present  Psychomotor Activity:  Normal  Concentration:  Concentration: Good and Attention Span: Good  Recall:  Good  Fund of Knowledge:  Good  Language:  Good  Akathisia:  Negative  Handed:  Right  AIMS (if indicated):     Assets:  Communication Skills Desire for Improvement Resilience  ADL's:  Intact  Cognition:  WNL  Sleep:  Number of Hours: 3.25    Treatment Plan Summary: Daily contact with patient to assess and evaluate symptoms and progress in treatment, Medication management, Plan inpatient admission  and medications as below  Observation Level/Precautions:  15 minute checks  Laboratory:  as needed  Psychotherapy: milieu, support    Medications:  Klonopin 1 mgrs BID, patient was restarted on Latuda, Depakote ER on admission, but states he had not been taking them and does not feel he needed them. He has been refusing these medications . He does state he will continue [place] medication .   Consultations:  As needed- I have reviewed elevated BP with hospitalist consultant, recommended starting Hydralazine 10 mgrs TID initially   Discharge Concerns: -    Estimated LOS: 5 days   Other:     Physician Treatment Plan for Primary Diagnosis:  Schizoaffective Disorder  Long Term Goal(s): Improvement in symptoms so as ready for discharge  Short Term Goals: Ability to verbalize feelings will improve, Ability to disclose and discuss suicidal ideas, Ability to demonstrate self-control will improve, Ability to identify and develop effective coping behaviors will improve and Ability to maintain clinical measurements within normal limits will improve  Physician Treatment Plan for Secondary Diagnosis: Active Problems:   Schizoaffective disorder, bipolar type ([place])  Long Term Goal(s): Improvement in symptoms so as ready for discharge  Short Term Goals: Ability to verbalize  feelings will improve, Ability to disclose and discuss suicidal ideas, Ability to demonstrate self-control will improve, Ability to identify and develop effective coping behaviors will improve and Ability to maintain clinical measurements within normal limits will improve  I certify that inpatient services furnished can reasonably be expected to improve the patient's condition.    Neita Garnet, MD 1/16/20184:21 PM

## 2017-01-09 NOTE — Progress Notes (Signed)
Adult Psychoeducational Group Note  Date:  01/09/2017 Time:  2000   Group Topic/Focus:  Wrap-Up Group:   The focus of this group is to help patients review their daily goal of treatment and discuss progress on daily workbooks.   Participation Level:  Active  Participation Quality:  Appropriate and Supportive  Affect:  Appropriate  Cognitive:  Alert and Appropriate  Insight: Appropriate  Engagement in Group:  Engaged and Supportive  Modes of Intervention:  Discussion  Additional Comments:  Stated his goals were to "meet his Dr and attend every group". Stated he accomplished his goals. When asked about his day pt stated, "I had a beautiful day".  Fransico MichaelBrooks, Dylon Correa Laverne 01/09/2017, 10:59 PM

## 2017-01-09 NOTE — Progress Notes (Signed)
Recreation Therapy Notes  INPATIENT RECREATION THERAPY ASSESSMENT  Patient Details Name: Alwyn RenMichael Route V. MRN: [id number] DOB: [birth year] Today's Date: 01/09/2017  Patient Stressors: Work, Other (Comment) (Financial Issues)  Pt stated he was here because he threatened to kill everybody and blow up the house because wife threatened to cheat on him because she thought he was cheating.  Coping Skills:   Exercise, Talking, Music, Sports  Personal Challenges: Anger, Concentration, Decision-Making, Expressing Yourself, Stress Management, Time Management  Leisure Interests (2+):  Games - Video games, Music - Listen, Individual - Reading, Individual - Phone  Awareness of Community Resources:  Yes  Community Resources:  Other (Comment) Psychologist, prison and probation services([employer])  Current Use: Yes  Patient Strengths:  Can take a lot mentally; Brush stuff off  Patient Identified Areas of Improvement:  Stress; Beating self up  Current Recreation Participation:  "Everyday"  Patient Goal for Hospitalization:  "Go home, leave with piece of mind"  [place]  [place]idence[place]  Current [place] (including self-harm):  No  Current HI:  No  Consent to Intern Participation: N/A   Caroll RancherMarjette Unita Detamore, LRT/CTRS  Caroll RancherLindsay, Duha Abair A 01/09/2017, 2:01 PM

## 2017-01-09 NOTE — Progress Notes (Signed)
Recreation Therapy Notes  Date: 01/09/17 Time: 1000 Location: [address] Dayroom  Group Topic: Coping Skills  Goal Area(s) Addresses:  Pt will be able to identify positive coping skills. Pt will be able to identify benefits of coping skills. Pt will be able to identify benefits of coping skills post d/c.  Intervention: Mind Map worksheet, pencils  Activity: Coping Skills Mind Map.  LRT introduced the concept of coping skills.  Patients were given a worksheet with a mind map to develop coping skills.  LRT and patients filled out the first 8 squares together, identifying triggers that would lead to the use of coping skills.  For each trigger patients were to list at least 3 coping skills.  Education: Coping Skills, Discharge Planning.   Education Outcome: Acknowledges understanding/In group clarification offered/Needs additional education.   Clinical Observations/Feedback: Pt did not attend group.    Allen Sosa, LRT/CTRS         Flem Enderle A 01/09/2017 12:16 PM 

## 2017-01-09 NOTE — Plan of Care (Signed)
Problem: Medication: Goal: Compliance with prescribed medication regimen will improve Outcome: Not Progressing Pt refused most of his prescribed medication last night  till he speaks to the provider.

## 2017-01-09 NOTE — [facility] Counselor (Signed)
Adult Comprehensive Assessment  Patient ID: Allen Sosa., male   DOB: 04/01/1981, 36 y.o.   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  States he plans to make wife payee of his check  Living/Environment/Situation:  Living Arrangements: Spouse/significant other Living conditions (as described by patient or guardian): (P) good neighborhood How long has patient lived in current situation?: (P) 1 month  Family History:  Marital status: (P) Married Number of Years Married: (P) 6.5 What types of issues is patient dealing with in the relationship?: (P) finances are adding stress Are you sexually active?: (P) Yes What is your sexual orientation?: (P) straight Does patient have children?: (P) Yes How many children?: (P) 12 How is patient's relationship with their children?: (P) step son  Childhood History:  By whom was/is the patient raised?: (P) Mother/father and step-parent Additional childhood history information: (P) Mother and step dad Description of patient's relationship with caregiver when they were a child: (P) Dad has bi-polar depression, and so is grandfather-would visit him when growoing up Patient's description of current relationship with people who raised him/her: (P) good How were you disciplined when you got in trouble as a child/adolescent?: (P) mom paaed in 2014-after she died, we moved here from [place] Does patient have siblings?: (P) Yes Number of Siblings: (P) 4 Description of patient's current relationship with siblings: (P) all half sisters, close with younger sister Did patient suffer any verbal/emotional/physical/sexual abuse as a child?: (P) No Did patient suffer from severe childhood  neglect?: (P) No Has patient ever been sexually abused/assaulted/raped as an adolescent or adult?: (P) No Was the patient ever a victim of a crime or a disaster?: (P) No Witnessed domestic violence?: (P) No Has patient been effected by domestic violence as an adult?: (P) No  Education:  Highest grade of school patient has completed: (P) 11 Currently a student?: (P) No Learning disability?: (P) No  Employment/Work Situation:   Employment situation: (P) On disability Why is patient on disability: (P) Bipolar How long has patient been on disability: (P) since age of 2 What is the longest time patient has a held a job?: (P) always part time, driving truck and bouncing Has patient ever been in the [employer]?: (P) No Are There Guns or Other Weapons in Your Home?: (P) No  Financial Resources:   Surveyor, quantity resources: (P) Receives SSI Does patient have a Lawyer or guardian?: (P) No  Alcohol/Substance Abuse:   Has alcohol/substance abuse ever caused legal problems?: (P) No  Social Support System:   Patient's Community Support System: (P) Good Describe Community Support System: (P) Friends, Step father,  Type of faith/religion: (P) Christian How does patient's faith help to cope with current illness?: (P) "Attitude of gratitude"  Leisure/Recreation:   Leisure and Hobbies: (P) play video games, spend time with son, wrestling  Strengths/Needs:   What things does the patient do well?: (P) driving In what areas does patient struggle / problems for patient: (P) being too hard on myself  Discharge Plan:   Does patient have access to transportation?: (P) Yes Will patient be returning to same living situation after discharge?: (P) Yes Currently receiving community mental health services: (P) Yes (From Whom)  Summary/Recommendations:   Summary and Recommendations (to be completed by the  evaluator): Allen Sosa is a 36 YO AA male diagnosed with Bipolar disorder.  He states he has  been medication compliant-he gets an [place] injection monthly-next due February 8 from Dr Mitzi HansenHeadon.  He was IVC'd because he and his wife got into an argument, and he stated that he would "blow the house up."  Allen Sosa insists he would never do that, and the arguing and statement were due to financial stress brought on by him not using his money to help with bills.  He can benefit from crises stabilization, medication management, therapeutic milieu and coordination with outpt provider.  Allen Sosa. 01/09/2017

## 2017-01-10 DIAGNOSIS — E669 Obesity, unspecified: Secondary | ICD-10-CM

## 2017-01-10 LAB — PROLACTIN: PROLACTIN: 33.8 ng/mL — AB (ref 4.0–15.2)

## 2017-01-10 LAB — HEMOGLOBIN A1C
HEMOGLOBIN A1C: 5.6 % (ref 4.8–5.6)
MEAN PLASMA GLUCOSE: 114 mg/dL

## 2017-01-10 NOTE — Progress Notes (Addendum)
  DATA ACTION RESPONSE  Objective- Pt. is up and visible in the milieu, seen resting in bed. Pt. presents with a flat affect and mood. Pt's behavior brighten on approach. Subjective- Denies having any SI/HI/VH/Pain at this time. Endorses AH, stating "I hear conversations in my head; they are not command". Pt. states " I'm feeling good; I find out that I was suppose to leave thursday". Pt. continues to be cooperative and remain safe & pleasant on the unit.  1:1 interaction in private to establish rapport. Encouragement, education, & support given from staff. No meds. ordered at this time.   Safety maintained with Q 15 checks. Continues to follow treatment plan and will monitor closely. No additonal questions/concerns noted.

## 2017-01-10 NOTE — [facility] Group Notes (Signed)
[facility] LCSW Group Therapy  01/10/2017 , 12:04 PM   Type of Therapy:  Group Therapy  Participation Level:  Active  Participation Quality:  Attentive  Affect:  Appropriate  Cognitive:  Alert  Insight:  Improving  Engagement in Therapy:  Engaged  Modes of Intervention:  Discussion, Exploration and Socialization  Summary of Progress/Problems: Today's group focused on the term Diagnosis.  Participants were asked to define the term, and then pronounce whether it is a negative, positive or neutral term.  Stayed the entire time, engaged throughout.  Somewhat pressured, intrusive, but redirectable.  Talked about gratitude and how he tries to make that a priority.  "I am thankful to be alive today, thankful for my health, thankful for this opportunity."  Daryel Geraldorth, Lajarvis Italiano B 01/10/2017 , 12:04 PM

## 2017-01-10 NOTE — Progress Notes (Signed)
[facility] MD Progress Note  01/10/2017 11:03 AM Allen Sosa  MRN:  [id number] Subjective: Patient reports " I am doing and feeling better now that I was started on this medication for my blood pressure."  Objective:Allen Lex V. is awake, alert and oriented *3. Seen  attending group session.  Denies suicidal or homicidal ideation. Patient reports chronic auditory hallucination since he was 36 years old.  denies visual hallucination and does not appear to be responding to internal stimuli.  Patient reports he has spoken to his wife and things are getting better with her and that he is able to go back home after discharge.   Patient reports he is medication compliant without mediation side effects. (monthy injection of Invega sustenne)  States his  depression 1/10 Reports good appetite and reports resting well.Support, encouragement and reassurance was provided.   Principal Problem: <principal problem not specified> Diagnosis:   Patient Active Problem List   Diagnosis Date Noted  . Hypertension [I10] 01/09/2017  . Obesity [E66.9] 01/09/2017  . Schizoaffective disorder, bipolar type ([place]) [F25.0] 01/07/2017  . 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 Abdominal pain [R10.9] 11/20/2014  . Abdominal pain in male [R10.9] 11/18/2014   Total Time spent with patient: 30 minutes  Past Psychiatric History:   Past Medical History:  Past Medical History:  Diagnosis Date  . Anxiety   . Depression   . Hyperlipemia   . Hypertension   . Obesity   . Schizo affective schizophrenia ([employer])   . Sleep apnea     Past Surgical History:  Procedure Laterality Date  . VASECTOMY     Family History:  Family History  Problem Relation Age of Onset  . Heart attack Mother   . Cancer Mother   . Cancer Sister    Family Psychiatric  History:  Social History:  History  Alcohol Use No     History  Drug Use No    Social History   Social History  .  Marital status: Married    Spouse name: N/A  . Number of children: 1  . Years of education: 7   Occupational History  . [employer]    Social History Main Topics  . Smoking status: Never Smoker  . Smokeless tobacco: Never Used  . Alcohol use No  . Drug use: No  . Sexual activity: Yes    Birth control/ protection: None   Other Topics Concern  . None   Social History Narrative   Drinks caffeine daily    Additional Social History:    Pain Medications: pt denies abuse - see pta meds list Prescriptions: pt denies abuse - see pta meds list Over the Counter: pt denies abuse - see pta meds list History of alcohol / drug use?: No history of alcohol / drug abuse Longest period of sobriety (when/how long): n/a                    Sleep: Fair  Appetite:  Fair  Current Medications: Current Facility-Administered Medications  Medication Dose Route Frequency Provider Last Rate Last Dose  . alum & mag hydroxide-simeth (MAALOX/MYLANTA) 200-200-20 MG/5ML suspension 30 mL  30 mL Oral Q4H PRN Benjamine Mola, FNP      . clonazePAM (KLONOPIN) tablet 1 mg  1 mg Oral TID Jenne Campus, MD   1 mg at 01/10/17 0756  . hydrALAZINE (APRESOLINE) tablet 10 mg  10 mg Oral TID Jenne Campus, MD  10 mg at 01/10/17 0756  . magnesium hydroxide (MILK OF MAGNESIA) suspension 30 mL  30 mL Oral Daily PRN Benjamine Mola, FNP      . pantoprazole (PROTONIX) EC tablet 40 mg  40 mg Oral Daily Benjamine Mola, FNP   40 mg at 01/10/17 0756  . traZODone (DESYREL) tablet 50 mg  50 mg Oral QHS PRN Benjamine Mola, FNP        Lab Results:  Results for orders placed or performed during the hospital encounter of 01/08/17 (from the past 48 hour(s))  TSH     Status: None   Collection Time: 01/09/17  6:11 AM  Result Value Ref Range   TSH 1.974 0.350 - 4.500 uIU/mL    Comment: Performed by a 3rd Generation assay with a functional sensitivity of <=0.01 uIU/mL. Performed at [facility]    Hemoglobin A1c     Status: None   Collection Time: 01/09/17  6:30 AM  Result Value Ref Range   Hgb A1c MFr Bld 5.6 4.8 - 5.6 %    Comment: (NOTE)         Pre-diabetes: 5.7 - 6.4         Diabetes: >6.4         Glycemic control for adults with diabetes: <7.0    Mean Plasma Glucose 114 mg/dL    Comment: (NOTE) Performed At: [facility] [place], [place] [id number] Lindon Romp MD VO:[id number] Performed at [facility], [place] [address] [place], [place] [address]   Comprehensive metabolic panel     Status: Abnormal   Collection Time: 01/09/17  6:40 AM  Result Value Ref Range   Sodium 136 135 - 145 mmol/L   Potassium 3.9 3.5 - 5.1 mmol/L   Chloride 101 101 - 111 mmol/L   CO2 26 22 - 32 mmol/L   Glucose, Bld 102 (H) 65 - 99 mg/dL   BUN 13 6 - 20 mg/dL   Creatinine, Ser 1.07 0.61 - 1.24 mg/dL   Calcium 9.0 8.9 - 10.3 mg/dL   Total Protein 7.3 6.5 - 8.1 g/dL   Albumin 4.0 3.5 - 5.0 g/dL   AST 29 15 - 41 U/L   ALT 31 17 - 63 U/L   Alkaline Phosphatase 84 38 - 126 U/L   Total Bilirubin 0.7 0.3 - 1.2 mg/dL   GFR calc non Af Amer >60 >60 mL/min   GFR calc Af Amer >60 >60 mL/min    Comment: (NOTE) The eGFR has been calculated using the CKD EPI equation. This calculation has not been validated in all clinical situations. eGFR's persistently <60 mL/min signify possible Chronic Kidney Disease.    Anion gap 9 5 - 15    Comment: Performed at [facility]  Prolactin     Status: Abnormal   Collection Time: 01/09/17  6:40 AM  Result Value Ref Range   Prolactin 33.8 (H) 4.0 - 15.2 ng/mL    Comment: (NOTE) Performed At: [facility] [place], [place] [id number] Lindon Romp MD OF:[id number] Performed at [facility], [place] [address] [place], [place] [address]   Bilirubin, direct     Status: Abnormal   Collection Time: 01/09/17  6:40 AM  Result Value Ref Range   Bilirubin,  Direct <0.1 (L) 0.1 - 0.5 mg/dL    Comment: Performed at [facility]  Lipid panel     Status: Abnormal  Collection Time: 01/09/17  6:40 AM  Result Value Ref Range   Cholesterol 198 0 - 200 mg/dL   Triglycerides 128 <150 mg/dL   HDL 40 (L) >40 mg/dL   Total CHOL/HDL Ratio 5.0 RATIO   VLDL 26 0 - 40 mg/dL   LDL Cholesterol 132 (H) 0 - 99 mg/dL    Comment:        Total Cholesterol/HDL:CHD Risk Coronary Heart Disease Risk Table                     Men   Women  1/2 Average Risk   3.4   3.3  Average Risk       5.0   4.4  2 X Average Risk   9.6   7.1  3 X Average Risk  23.4   11.0        Use the calculated Patient Ratio above and the CHD Risk Table to determine the patient's CHD Risk.        ATP III CLASSIFICATION (LDL):  <100     mg/dL   Optimal  100-129  mg/dL   Near or Above                    Optimal  130-159  mg/dL   Borderline  160-189  mg/dL   High  >190     mg/dL   Very High Performed at [facility]     Blood Alcohol level:  Lab Results  Component Value Date   [facility] <5 01/06/2017   ETH <5 38/17/7116    Metabolic Disorder Labs: Lab Results  Component Value Date   HGBA1C 5.6 01/09/2017   MPG 114 01/09/2017   Lab Results  Component Value Date   PROLACTIN 33.8 (H) 01/09/2017   Lab Results  Component Value Date   CHOL 198 01/09/2017   TRIG 128 01/09/2017   HDL 40 (L) 01/09/2017   CHOLHDL 5.0 01/09/2017   VLDL 26 01/09/2017   LDLCALC 132 (H) 01/09/2017    Physical Findings: AIMS: Facial and Oral Movements Muscles of Facial Expression: None, normal Lips and Perioral Area: None, normal Jaw: None, normal Tongue: None, normal,Extremity Movements Upper (arms, wrists, hands, fingers): None, normal Lower (legs, knees, ankles, toes): None, normal, Trunk Movements Neck, shoulders, hips: None, normal, Overall Severity Severity of abnormal movements (highest score from questions above): None, normal Incapacitation due to abnormal  movements: None, normal Patient's awareness of abnormal movements (rate only patient's report): No Awareness, Dental Status Current problems with teeth and/or dentures?: No Does patient usually wear dentures?: No  CIWA:    COWS:     Musculoskeletal: Strength & Muscle Tone: within normal limits Gait & Station: normal Patient leans: N/A  Psychiatric Specialty Exam: Physical Exam  Nursing note and vitals reviewed. Constitutional: He is oriented to person, place, and time. He appears well-developed.  HENT:  Head: Normocephalic.  Cardiovascular: Normal rate.   Neurological: He is alert and oriented to person, place, and time.  Psychiatric: He has a normal mood and affect. His behavior is normal.    Review of Systems  Psychiatric/Behavioral: Positive for hallucinations. Negative for depression (stable). The patient is nervous/anxious.     Blood pressure (!) 141/75, pulse (!) 112, temperature 98.4 F (36.9 C), temperature source Oral, resp. rate 18, height 6' 1.5" (1.867 m), weight (!) 199.6 kg (440 lb), SpO2 99 %.Body mass index is 57.26 kg/m.  General Appearance: Casual  Eye Contact:  Good  Speech:  Clear and Coherent  Volume:  Normal  Mood:  Euphoric  Affect:  Congruent  Thought Process:  Coherent  Orientation:  Full (Time, Place, and Person)  Thought Content:  Hallucinations: Auditory  Suicidal Thoughts:  No  Homicidal Thoughts:  No  Memory:  Immediate;   Fair Recent;   Fair Remote;   Fair  Judgement:  Fair  Insight:  Fair and Present  Psychomotor Activity:  Normal  Concentration:  Concentration: Fair  Recall:  [employer] of Knowledge:  Fair  Language:  Fair  Akathisia:  No  Handed:  Right  AIMS (if indicated):     Assets:  Communication Skills Desire for Improvement Social Support  ADL's:  Intact  Cognition:  WNL  Sleep:  Number of Hours: 3.25    I agree with current treatment plan on 01/10/2017, Patient seen face-to-face for psychiatric evaluation follow-up,  chart reviewed. Reviewed the information documented and agree with the treatment plan.   Treatment Plan Summary: Daily contact with patient to assess and evaluate symptoms and progress in treatment and Medication management   Patient reports taken a monthly injection (Invega sustenna)   Continue with Klonopin 1 mg PO TID for mood stabilization. Continue with Trazodone 53m for insomnia  Will continue to monitor vitals ,medication compliance and treatment side effects while patient is here.   Reviewed labs; valproic acid level 14  elevated ,BAL - , UDS -.  CSW will start working on disposition.  Patient to participate in therapeutic milieu  [facility] NP 01/10/2017, 11:03 AM   Agree with NP Progress Note

## 2017-01-10 NOTE — Progress Notes (Signed)
Nursing Note 01/10/2017 [id number]  Data Reports sleeping good without PRN sleep med.  Rates depression 1/10, hopelessness 1/10, and anxiety 1/10. Affect bright, wide ranged mood euthymic.  Denies HI, SI.  Reports auditory hallucinations of conversations with others have decreased and are "as good as they can get."  Says his main concern was his living situation, and said "I have a good plan now."  Patient was in good spirits most of day; however, at the end of the shift (around 1830) patient reported feeling "like my symptoms are coming back, I think I need to stay another week or two.  I am just thinking about my wife and my job and everything outside of here and I don't know if I'm ready."   Action Spoke with patient 1:1, nurse offered support to patient throughout shift.  When patient voiced concerns nurse spoke with patient and reflected anxiety and worry back to patient.  Encouraged patient to review his discharge plans and see if he might be taking on too much when he leaves, and consider cutting back on something.  Given a "distress tolerance" packet to review.  Encouraged patient to verbalize feelings to MD in the morning.  Continues to be monitored on 15 minute checks for safety.  Response Patient thanked nurse for talking to him.  Remains safe on unit.  Was very positive about discharge but abruptly verbalized worry at end of shift.  States he will review distress tolerance packet and think about it before talking to the MD tomorrow.

## 2017-01-10 NOTE — Tx Team (Signed)
Interdisciplinary Treatment and Diagnostic Plan Update  01/10/2017 Time of Session: 8:57 AM  Ashely Joshua MRN: [id number]  Principal Diagnosis: <principal problem not specified>  Secondary Diagnoses: Active Problems:   Schizoaffective disorder, bipolar type ([place])   Hypertension   Obesity   Current Medications:  Current Facility-Administered Medications  Medication Dose Route Frequency Provider Last Rate Last Dose  . alum & mag hydroxide-simeth (MAALOX/MYLANTA) 200-200-20 MG/5ML suspension 30 mL  30 mL Oral Q4H PRN Benjamine Mola, FNP      . clonazePAM (KLONOPIN) tablet 1 mg  1 mg Oral TID Jenne Campus, MD   1 mg at 01/10/17 0756  . hydrALAZINE (APRESOLINE) tablet 10 mg  10 mg Oral TID Jenne Campus, MD   10 mg at 01/10/17 0756  . magnesium hydroxide (MILK OF MAGNESIA) suspension 30 mL  30 mL Oral Daily PRN Benjamine Mola, FNP      . pantoprazole (PROTONIX) EC tablet 40 mg  40 mg Oral Daily Benjamine Mola, FNP   40 mg at 01/10/17 0756  . traZODone (DESYREL) tablet 50 mg  50 mg Oral QHS PRN Benjamine Mola, FNP        PTA Medications: Prescriptions Prior to Admission  Medication Sig Dispense Refill Last Dose  . clonazePAM (KLONOPIN) 2 MG tablet Take 2 mg by mouth 3 (three) times daily.   0 01/05/2017 at Unknown time  . divalproex (DEPAKOTE ER) 500 MG 24 hr tablet Take 500 mg by mouth 2 (two) times daily.  1 01/05/2017 at Unknown time  . ibuprofen (ADVIL,MOTRIN) 200 MG tablet Take 200-400 mg by mouth every 4 (four) hours as needed for headache (for pain.).    Past Month at Unknown time  . LATUDA 60 MG TABS Take 60 mg by mouth daily.  1 01/05/2017 at Unknown time  . omeprazole (PRILOSEC) 20 MG capsule Take 40 mg by mouth 2 (two) times daily.    01/05/2017 at Unknown time  . Paliperidone Palmitate (INVEGA SUSTENNA IM) Inject into the muscle every 3 (three) months. Patient receives at doctors office and does not know mg   2 months ago at Unknown    Treatment Modalities: Medication  Management, Group therapy, Case management,  1 to 1 session with clinician, Psychoeducation, Recreational therapy.   Physician Treatment Plan for Primary Diagnosis: <principal problem not specified> Long Term Goal(s): Improvement in symptoms so as ready for discharge  Short Term Goals: Ability to verbalize feelings will improve Ability to disclose and discuss suicidal ideas Ability to demonstrate self-control will improve Ability to identify and develop effective coping behaviors will improve Ability to maintain clinical measurements within normal limits will improve Ability to verbalize feelings will improve Ability to disclose and discuss suicidal ideas Ability to demonstrate self-control will improve Ability to identify and develop effective coping behaviors will improve Ability to maintain clinical measurements within normal limits will improve  Medication Management: Evaluate patient's response, side effects, and tolerance of medication regimen.  Therapeutic Interventions: 1 to 1 sessions, Unit Group sessions and Medication administration.  Evaluation of Outcomes: Progressing  Physician Treatment Plan for Secondary Diagnosis: Active Problems:   Schizoaffective disorder, bipolar type ([place])   Hypertension   Obesity   Long Term Goal(s): Improvement in symptoms so as ready for discharge  Short Term Goals: Ability to verbalize feelings will improve Ability to disclose and discuss suicidal ideas Ability to demonstrate self-control will improve Ability to identify and develop effective coping behaviors will improve Ability to maintain clinical  measurements within normal limits will improve Ability to verbalize feelings will improve Ability to disclose and discuss suicidal ideas Ability to demonstrate self-control will improve Ability to identify and develop effective coping behaviors will improve Ability to maintain clinical measurements within normal limits will  improve  Medication Management: Evaluate patient's response, side effects, and tolerance of medication regimen.  Therapeutic Interventions: 1 to 1 sessions, Unit Group sessions and Medication administration.  Evaluation of Outcomes: Progressing   RN Treatment Plan for Primary Diagnosis: <principal problem not specified> Long Term Goal(s): Knowledge of disease and therapeutic regimen to maintain health will improve  Short Term Goals: Ability to identify and develop effective coping behaviors will improve and Compliance with prescribed medications will improve  Medication Management: RN will administer medications as ordered by provider, will assess and evaluate patient's response and provide education to patient for prescribed medication. RN will report any adverse and/or side effects to prescribing provider.  Therapeutic Interventions: 1 on 1 counseling sessions, Psychoeducation, Medication administration, Evaluate responses to treatment, Monitor vital signs and CBGs as ordered, Perform/monitor CIWA, COWS, AIMS and Fall Risk screenings as ordered, Perform wound care treatments as ordered.  Evaluation of Outcomes: Progressing   LCSW Treatment Plan for Primary Diagnosis: <principal problem not specified> Long Term Goal(s): Safe transition to appropriate next level of care at discharge, Engage patient in therapeutic group addressing interpersonal concerns.  Short Term Goals: Engage patient in aftercare planning with referrals and resources  Therapeutic Interventions: Assess for all discharge needs, 1 to 1 time with Social worker, Explore available resources and support systems, Assess for adequacy in community support network, Educate family and significant other(s) on suicide prevention, Complete Psychosocial Assessment, Interpersonal group therapy.  Evaluation of Outcomes: Met  Return home, follow up current provider   Progress in Treatment: Attending groups: Yes Participating in  groups: Yes Taking medication as prescribed: Yes Toleration medication: Yes, no side effects reported at this time Family/Significant other contact made: Yes Patient understands diagnosis: Yes AEB asking for help with "getting my mood under control" Discussing patient identified problems/goals with staff: Yes Medical problems stabilized or resolved: Yes Denies suicidal/homicidal ideation: Yes Issues/concerns per patient self-inventory: None Other: N/A  New problem(s) identified: None identified at this time.   New Short Term/Long Term Goal(s): None identified at this time.   Discharge Plan or Barriers:   Reason for Continuation of Hospitalization:  Mania  Medication stabilization   Estimated Length of Stay: 3-5 days  Attendees: Patient: 01/10/2017  8:57 AM  Physician: Ursula Alert, MD 01/10/2017  8:57 AM  Nursing: Hoy Register, RN 01/10/2017  8:57 AM  RN Care Manager: Lars Pinks, RN 01/10/2017  8:57 AM  Social Worker: Ripley Fraise 01/10/2017  8:57 AM  Recreational Therapist: Laretta Bolster  01/10/2017  8:57 AM  Other: Norberto Sorenson 01/10/2017  8:57 AM  Other:  01/10/2017  8:57 AM    Scribe for Treatment Team:  Roque Lias LCSW 01/10/2017 8:57 AM

## 2017-01-10 NOTE — [facility] Suicide Risk Assessment (Addendum)
[facility] INPATIENT:  Family/Significant Other Suicide Prevention Education  Suicide Prevention Education:  Education Completed; Allen Sosa, wife, [phone]  has been identified by the patient as the family member/significant other with whom the patient will be residing, and identified as the person(s) who will aid the patient in the event of a mental health crisis (suicidal ideations/suicide attempt).  With written consent from the patient, the family member/significant other has been provided the following suicide prevention education, prior to the and/or following the discharge of the patient.  The suicide prevention education provided includes the following:  Suicide risk factors  Suicide prevention and interventions  National Suicide Hotline telephone number  [facility] assessment telephone number  [facility] Emergency Assistance 911  [facility] and/or Residential Mobile Crisis Unit telephone number  Request made of family/significant other to:  Remove weapons (e.g., guns, rifles, knives), all items previously/currently identified as safety concern.    Remove drugs/medications (over-the-counter, prescriptions, illicit drugs), all items previously/currently identified as a safety concern.  The family member/significant other verbalizes understanding of the suicide prevention education information provided.  The family member/significant other agrees to remove the items of safety concern listed above. Pt and wife agree that his impulsive spending is a big stressor in the marriage, and both agreed that her becoming his payee would help immensely so they can pay the bills.  They lost subsidized housing due to his mismanagement of money, and are now having to pay "fair market rate." Allen Sosa 01/10/2017, 8:53 AM

## 2017-01-11 MED ORDER — CLONAZEPAM 1 MG PO TABS
1.0000 mg | ORAL_TABLET | Freq: Once | ORAL | Status: AC
  Administered 2017-01-11: 1 mg via ORAL
  Filled 2017-01-11: qty 1

## 2017-01-11 MED ORDER — PALIPERIDONE ER 3 MG PO TB24
3.0000 mg | ORAL_TABLET | Freq: Every day | ORAL | Status: DC
  Administered 2017-01-11 – 2017-01-12 (×2): 3 mg via ORAL
  Filled 2017-01-11 (×4): qty 1

## 2017-01-11 MED ORDER — TRAZODONE HCL 50 MG PO TABS
50.0000 mg | ORAL_TABLET | Freq: Every evening | ORAL | Status: DC | PRN
  Administered 2017-01-11 (×2): 50 mg via ORAL
  Filled 2017-01-11 (×3): qty 1

## 2017-01-11 MED ORDER — AMLODIPINE BESYLATE 5 MG PO TABS
5.0000 mg | ORAL_TABLET | Freq: Every day | ORAL | Status: DC
  Administered 2017-01-11 – 2017-01-12 (×2): 5 mg via ORAL
  Filled 2017-01-11 (×4): qty 1

## 2017-01-11 MED ORDER — ACETAMINOPHEN 325 MG PO TABS
650.0000 mg | ORAL_TABLET | Freq: Four times a day (QID) | ORAL | Status: DC | PRN
  Administered 2017-01-11 – 2017-01-13 (×3): 650 mg via ORAL
  Filled 2017-01-11 (×3): qty 2

## 2017-01-11 NOTE — Progress Notes (Signed)
D: Pt +ve AH- non command, denies SI/HI/VH. Pt is pleasant and cooperative. Pt stated he was doing a lot better since coming to the hospital.   A: Pt was offered support and encouragement. Pt was given scheduled medications. Pt was encourage to attend groups. Q 15 minute checks were done for safety.   R: interacts well with peers and staff. Pt is taking medication. Pt has no complaints at this time .Pt receptive to treatment and safety maintained on unit.

## 2017-01-11 NOTE — Plan of Care (Signed)
Problem: Safety: Goal: Periods of time without injury will increase Outcome: Progressing Pt has been safe on the unit at this time   

## 2017-01-11 NOTE — Progress Notes (Signed)
[facility]  01/11/2017 2:22 PM Allen RenMichael Damian V.  MRN:  [id number] Subjective: Patient reports " I didn't have a goodnight last night, I was having multiple attacks." patient is requesting a increase with his Klonopin.  Objective:Allen Estabrooks V. is awake, alert and oriented *3. Seen attending group session. Patient was evaluated with MD. Denies suicidal or homicidal ideation. Patient reports pain and anxiety which is keeping him up throughout the night.    Patient reports he is medication compliant without mediation side effects. (monthly injection of Invega sustenne that is due 01/01/2017)  NP. [facility], who reports patient doesn't have the a prescription to take [place] monthly injection.   Medication are listed below:  -Klonopin 2 mg Po BID, Effexor 75 mg Po BID and Depakote ER 500 mg, Lutuda 60mg   States his  depression 1/10 Reports good appetite and reports resting well. Patient reports chronic auditory hallucination. denies visual hallucination and does not appear to be responding to internal stimuli. Support, encouragement and reassurance was provided.   Principal Problem: Schizoaffective disorder, bipolar type (HCC) Diagnosis:   Patient Active Problem List   Diagnosis Date Noted  . Hypertension [I10] 01/09/2017  . Obesity [E66.9] 01/09/2017  . Schizoaffective disorder, bipolar type (HCC) [F25.0] 01/07/2017  . 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 Abdominal pain [R10.9] 11/20/2014  . Abdominal pain in male [R10.9] 11/18/2014   Total Time spent with patient: 30 minutes  Past Psychiatric History:   Past Medical History:  Past Medical History:  Diagnosis Date  . Anxiety   . Depression   . Hyperlipemia   . Hypertension   . Obesity   . Schizo affective schizophrenia (HCC)   . Sleep apnea     Past Surgical History:  Procedure Laterality Date  . VASECTOMY     Family History:   Family History  Problem Relation Age of Onset  . Heart attack Mother   . Cancer Mother   . Cancer Sister    Family Psychiatric  History:  Social History:  History  Alcohol Use No     History  Drug Use No    Social History   Social History  . Marital status: Married    Spouse name: N/A  . Number of children: 1  . Years of education: 711   Occupational History  . [employer]    Social History Main Topics  . Smoking status: Never Smoker  . Smokeless tobacco: Never Used  . Alcohol use No  . Drug use: No  . Sexual activity: Yes    Birth control/ protection: None   Other Topics Concern  . None   Social History Narrative   Drinks caffeine daily    Additional Social History:    Pain Medications: pt denies abuse - see pta meds list Prescriptions: pt denies abuse - see pta meds list Over the Counter: pt denies abuse - see pta meds list History of alcohol / drug use?: No history of alcohol / drug abuse Longest period of sobriety (when/how long): n/a                    Sleep: Fair  Appetite:  Fair  Current Medications: Current Facility-Administered Medications  Medication Dose Route Frequency Provider Last Rate Last Dose  . acetaminophen (TYLENOL) tablet 650 mg  650 mg Oral Q6H PRN Kerry HoughSpencer E Simon, PA-C   650 mg at 01/11/17 0107  . alum & mag  hydroxide-simeth (MAALOX/MYLANTA) 200-200-20 MG/5ML suspension 30 mL  30 mL Oral Q4H PRN [name], FNP   30 mL at 01/10/17 1923  . amLODipine (NORVASC) tablet 5 mg  5 mg Oral Daily [name], MD      . clonazePAM (KLONOPIN) tablet 1 mg  1 mg Oral TID [name], MD   1 mg at 01/11/17 1133  . hydrALAZINE (APRESOLINE) tablet 10 mg  10 mg Oral TID [name], MD   10 mg at 01/11/17 1133  . magnesium hydroxide (MILK OF MAGNESIA) suspension 30 mL  30 mL Oral Daily PRN [name], FNP      . pantoprazole (PROTONIX) EC tablet 40 mg  40 mg Oral Daily [name], FNP   40 mg at 01/11/17 0813  .  traZODone (DESYREL) tablet 50 mg  50 mg Oral QHS PRN,MR X 1 Spencer E Simon, PA-C   50 mg at 01/11/17 0107    Lab Results:  No results found for this or any previous visit (from the past 48 hour(s)).  Blood Alcohol level:  Lab Results  Component Value Date   ETH <5 01/06/2017   ETH <5 01/05/2017    Metabolic Disorder Labs: Lab Results  Component Value Date   HGBA1C 5.6 01/09/2017   MPG 114 01/09/2017   Lab Results  Component Value Date   PROLACTIN 33.8 (H) 01/09/2017   Lab Results  Component Value Date   CHOL 198 01/09/2017   TRIG 128 01/09/2017   HDL 40 (L) 01/09/2017   CHOLHDL 5.0 01/09/2017   VLDL 26 01/09/2017   LDLCALC 132 (H) 01/09/2017    Physical Findings: AIMS: Facial and Oral Movements Muscles of Facial Expression: None, normal Lips and Perioral Area: None, normal Jaw: None, normal Tongue: None, normal,Extremity Movements Upper (arms, wrists, hands, fingers): None, normal Lower (legs, knees, ankles, toes): None, normal, Trunk Movements Neck, shoulders, hips: None, normal, Overall Severity Severity of abnormal movements (highest score from questions above): None, normal Incapacitation due to abnormal movements: None, normal Patient's awareness of abnormal movements (rate only patient's report): No Awareness, Dental Status Current problems with teeth and/or dentures?: No Does patient usually wear dentures?: No  CIWA:    COWS:     Musculoskeletal: Strength & Muscle Tone: within normal limits Gait & Station: normal Patient leans: N/A  Psychiatric Specialty Exam: Physical Exam  Nursing note and vitals reviewed. Constitutional: He is oriented to person, place, and time. He appears well-developed.  HENT:  Head: Normocephalic.  Cardiovascular: Normal rate.   Neurological: He is alert and oriented to person, place, and time.  Psychiatric: He has a normal mood and affect. His behavior is normal.    Review of Systems  Psychiatric/Behavioral: Positive  for hallucinations. Negative for depression (stable). The patient is nervous/anxious.     Blood pressure 137/90, pulse 100, temperature 98 F (36.7 C), temperature source Oral, resp. rate 20, height 6' 1.5" (1.867 m), weight (!) 199.6 kg (440 lb), SpO2 99 %.Body mass index is 57.26 kg/m.  General Appearance: Casual  Eye Contact:  Good  Speech:  Clear and Coherent  Volume:  Normal  Mood:  Euphoric  Affect:  Congruent  Thought Process:  Coherent  Orientation:  Full (Time, Place, and Person)  Thought Content:  Hallucinations: Auditory  Suicidal Thoughts:  No  Homicidal Thoughts:  No  Memory:  Immediate;   Fair Recent;   Fair Remote;   Fair  Judgement:  Fair  Insight:  Fair  and Present  Psychomotor Activity:  Normal  Concentration:  Concentration: Fair  Recall:  [employer] of Knowledge:  Fair  Language:  Fair  Akathisia:  No  Handed:  Right  AIMS (if indicated):     Assets:  Communication Skills Desire for Improvement Social Support  ADL's:  Intact  Cognition:  WNL  Sleep:  Number of Hours: 3    I agree with current treatment plan on 01/11/2017, Patient seen face-to-face for psychiatric evaluation follow-up, chart reviewed. Reviewed the information documented and agree with the treatment plan.   Treatment Plan Summary: Daily contact with patient to assess and evaluate symptoms and progress in treatment and Medication management   SW to call wife and verify  medications. Patient reports taken a monthly injection (Invega sustenna)  Restarted Invega 3 mg PO until discharge/or verified  injection  Continue with Klonopin 1 mg PO TID for mood stabilization. Continue with Trazodone 50mg  for insomnia  Will continue to monitor vitals ,medication compliance and treatment side effects while patient is here.   Reviewed labs; valproic acid level 14  elevated ,BAL - , UDS -.  CSW will start working on disposition.  Patient to participate in therapeutic milieu  [name],  NP 01/11/2017, 2:22 PM

## 2017-01-11 NOTE — Progress Notes (Signed)
D: Patient denies SI/HI and visual hallucinations; patient reports auditory hallucinations; patient states " I always hear mummbles, but I can handle that";  patient reports sleep is good; reports appetite is good ; reports energy level is normal ; reports ability to concentrate is good; rates depression as 0/10; rates hopelessness 0/10; rates anxiety as 0/10;   A: Monitored q 15 minutes; patient encouraged to attend groups; patient educated about medications; patient given medications per physician orders; patient encouraged to express feelings and/or concerns  R: Patient is pleasant and cooperative; patient patient's interaction with staff and peers is appropriate; patient was able to set goal to talk with staff 1:1 when having feelings of SI; patient is taking medications as prescribed and tolerating medications; patient is attending all groups

## 2017-01-11 NOTE — [facility] Group Notes (Signed)
Pt did not attend karaoke group this evening.  

## 2017-01-11 NOTE — Progress Notes (Signed)
D: Pt at the time of assessment endorsed severe anxiety; states, "I just spoke to my wife a minute ago and she had a lot of mean things to say; beside that all I get here is 1mg  of klonopin 3 times instead of 2 mg that my doctor had prescribed for me" Pt also endorsed moderate depression and generalized body pain. Pt was restless however remained pleasant. A: Medications including 1 time klonopin was offered.  Support, encouragement, and safe environment provided.  15-minute safety checks continue. R: Pt was med compliant.  Pt attended group. Safety checks continue

## 2017-01-11 NOTE — Progress Notes (Signed)
01/11/2017 1:49 PM  Progress Note  Vitals:   01/11/17 0552 01/11/17 1133  BP: (!) 157/105 137/90  Pulse: (!) 105 100  Resp:    Temp:     Pt's blood pressure is improving but still suboptimally controlled.  Also having a lot of anxiety which may be contributing.  Will add amlodipine 5 mg daily to regimen and change to heart healthy diet and follow BP trends.   Allen Sosa. Devron Cohick, MD [phone] [facility]  [facility] [place]

## 2017-01-11 NOTE — [facility] Group Notes (Signed)
[facility] Group Notes:  (Counselor/Nursing/MHT/Case Management/Adjunct)  01/11/2017 1:15PM  Type of Therapy:  Group Therapy  Participation Level:  Active  Participation Quality:  Appropriate  Affect:  Flat  Cognitive:  Oriented  Insight:  Improving  Engagement in Group:  Limited  Engagement in Therapy:  Limited  Modes of Intervention:  Discussion, Exploration and Socialization  Summary of Progress/Problems: The topic for group was balance in life.  Pt participated in the discussion about when their life was in balance and out of balance and how this feels.  Pt discussed ways to get back in balance and short term goals they can work on to get where they want to be. In and out multiple times, engaged while there.  "I know I am balanced because I'm talking in group and I am not having a panic attack."  Identified seeking others out to talk to them as things that help him find balance, as well as watching wrestling, driving and listening to music.   Daryel Geraldorth, Ania Levay B 01/11/2017 3:09 PM

## 2017-01-12 DIAGNOSIS — I10 Essential (primary) hypertension: Secondary | ICD-10-CM

## 2017-01-12 DIAGNOSIS — F250 Schizoaffective disorder, bipolar type: Principal | ICD-10-CM

## 2017-01-12 MED ORDER — VENLAFAXINE HCL ER 37.5 MG PO CP24
37.5000 mg | ORAL_CAPSULE | Freq: Every day | ORAL | Status: DC
  Administered 2017-01-12 – 2017-01-13 (×2): 37.5 mg via ORAL
  Filled 2017-01-12 (×4): qty 1

## 2017-01-12 MED ORDER — AMLODIPINE BESYLATE 10 MG PO TABS
10.0000 mg | ORAL_TABLET | Freq: Every day | ORAL | Status: DC
  Administered 2017-01-13: 10 mg via ORAL
  Filled 2017-01-12 (×3): qty 1

## 2017-01-12 MED ORDER — HYDROCHLOROTHIAZIDE 12.5 MG PO CAPS
12.5000 mg | ORAL_CAPSULE | Freq: Every day | ORAL | Status: DC
  Administered 2017-01-12 – 2017-01-13 (×2): 12.5 mg via ORAL
  Filled 2017-01-12 (×4): qty 1

## 2017-01-12 MED ORDER — TRAZODONE HCL 100 MG PO TABS
100.0000 mg | ORAL_TABLET | Freq: Every day | ORAL | Status: DC
  Administered 2017-01-12: 100 mg via ORAL
  Filled 2017-01-12 (×3): qty 1

## 2017-01-12 NOTE — Progress Notes (Signed)
  [facility] Adult Case Management Discharge Plan :  Will you be returning to the same living situation after discharge:  Yes,  home At discharge, do you have transportation home?: Yes,  wife Do you have the ability to pay for your medications: Yes,  MCD  Release of information consent forms completed and in the chart;  Patient's signature needed at discharge.  Patient to Follow up at: Follow-up Information    [facility]   Why:  Friday at 4:40 with Dr Mitzi HansenHeadon. This is when you are due for your injection again. Contact information: [address]  [place] [phone]          Next level of care provider has access to [facility] Link:no  Safety Planning and Suicide Prevention discussed: Yes,  yes  Have you used any form of tobacco in the last 30 days? (Cigarettes, Smokeless Tobacco, Cigars, and/or Pipes): No  Has patient been referred to the Quitline?: N/A patient is not a smoker  Patient has been referred for addiction treatment: N/A  Allen Sosa 01/12/2017, 3:58 PM

## 2017-01-12 NOTE — [facility] Group Notes (Signed)
[facility] LCSW Group Therapy  01/12/2017  1:05 PM  Type of Therapy:  Group therapy  Participation Level:  Active  Participation Quality:  Attentive  Affect:  Flat  Cognitive:  Oriented  Insight:  Limited  Engagement in Therapy:  Limited  Modes of Intervention:  Discussion, Socialization  Summary of Progress/Problems:  Chaplain was here to lead a group on themes of hope and courage. Invited.  Chose to not attend.  Daryel Geraldorth, Zaydon Kinser B 01/12/2017 12:54 PM

## 2017-01-12 NOTE — Progress Notes (Signed)
Patient ID: Allen RenMichael Giese V., male   DOB: January 03, 1981, 36 y.o.   MRN: [id number]  D: "My mood has been up and down after my conversation with my wife". Pt reports he believes wife want to file for separation. Pt reports he is not sure what to do after discharge. Patient endorses AH stating they are not commands just conversations. Denies  SI/HI/VH and pain.No behavioral issues noted.  A: Support and encouragement offered as needed. Medications administered as prescribed.  R: Patient safe and cooperative on unit. Pt attended and engaged in evening wrap up group.

## 2017-01-12 NOTE — Progress Notes (Signed)
[facility]  01/12/2017 3:07 PM Allen RenMichael Sills V.  MRN:  [id number] Subjective: Patient reports " I feel hopeless."   Objective:Allen Vinzant V. is seen as depressed, tearful this AM. Patient seen and chart reviewed.Discussed patient with treatment team.  Pt reports he is anxious and sad and feels hopeless about returning to his wife. Pt reports he does not feel safe today and needs to stay here since he feels that will help him. Pt was seen by CSW who was able to discuss his concerns about his social situation. Discussed medications with pt - pt reports Allen Sosa has been helping. Will need to verify when he is due for his next injection. Discussed starting effexor for anxiety , tearfulness - he agrees.     Principal Problem: Schizoaffective disorder, bipolar type (HCC) Diagnosis:   Patient Active Problem List   Diagnosis Date Noted  . Hypertension [I10] 01/09/2017  . Obesity [E66.9] 01/09/2017  . Schizoaffective disorder, bipolar type (HCC) [F25.0] 01/07/2017  . 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 Abdominal pain [R10.9] 11/20/2014  . Abdominal pain in male [R10.9] 11/18/2014   Total Time spent with patient: 30 minutes  Past Psychiatric History: Please see H&P.   Past Medical History:  Past Medical History:  Diagnosis Date  . Anxiety   . Depression   . Hyperlipemia   . Hypertension   . Obesity   . Schizo affective schizophrenia (HCC)   . Sleep apnea     Past Surgical History:  Procedure Laterality Date  . VASECTOMY     Family History:  Family History  Problem Relation Age of Onset  . Heart attack Mother   . Cancer Mother   . Cancer Sister    Family Psychiatric  History: Please see H&P.  Social History:  History  Alcohol Use No     History  Drug Use No    Social History   Social History  . Marital status: Married    Spouse name: N/A  . Number of children: 1  . Years of education: 1811    Occupational History  . [employer]    Social History Main Topics  . Smoking status: Never Smoker  . Smokeless tobacco: Never Used  . Alcohol use No  . Drug use: No  . Sexual activity: Yes    Birth control/ protection: None   Other Topics Concern  . None   Social History Narrative   Drinks caffeine daily    Additional Social History:    Pain Medications: pt denies abuse - see pta meds list Prescriptions: pt denies abuse - see pta meds list Over the Counter: pt denies abuse - see pta meds list History of alcohol / drug use?: No history of alcohol / drug abuse Longest period of sobriety (when/how long): n/a                    Sleep: Poor  Appetite:  Fair  Current Medications: Current Facility-Administered Medications  Medication Dose Route Frequency Provider Last Rate Last Dose  . acetaminophen (TYLENOL) tablet 650 mg  650 mg Oral Q6H PRN Kerry HoughSpencer E Simon, PA-C   650 mg at 01/11/17 2010  . alum & mag hydroxide-simeth (MAALOX/MYLANTA) 200-200-20 MG/5ML suspension 30 mL  30 mL Oral Q4H PRN Beau FannyJohn C Withrow, FNP   30 mL at 01/10/17 1923  . [START ON 01/13/2017] amLODipine (NORVASC) tablet 10 mg  10 mg Oral Daily Allen LongsSaramma Eshan Trupiano, MD      .  clonazePAM (KLONOPIN) tablet 1 mg  1 mg Oral TID Allen Cotta, MD   1 mg at 01/12/17 1157  . hydrALAZINE (APRESOLINE) tablet 10 mg  10 mg Oral TID Allen Cotta, MD   10 mg at 01/12/17 1157  . hydrochlorothiazide (MICROZIDE) capsule 12.5 mg  12.5 mg Oral Daily Allen L Johnson, MD      . magnesium hydroxide (MILK OF MAGNESIA) suspension 30 mL  30 mL Oral Daily PRN [name], FNP      . paliperidone (INVEGA) 24 hr tablet 3 mg  3 mg Oral QHS Allen Rack, NP   3 mg at 01/11/17 2302  . pantoprazole (PROTONIX) EC tablet 40 mg  40 mg Oral Daily [name], FNP   40 mg at 01/12/17 0742  . traZODone (DESYREL) tablet 100 mg  100 mg Oral QHS Allen Manninen, MD      . venlafaxine XR (EFFEXOR-XR) 24 hr capsule 37.5 mg  37.5 mg Oral Q  breakfast Allen Storrs, MD        Lab Results:  No results found for this or any previous visit (from the past 48 hour(s)).  Blood Alcohol level:  Lab Results  Component Value Date   ETH <5 01/06/2017   ETH <5 01/05/2017    Metabolic Disorder Labs: Lab Results  Component Value Date   HGBA1C 5.6 01/09/2017   MPG 114 01/09/2017   Lab Results  Component Value Date   PROLACTIN 33.8 (H) 01/09/2017   Lab Results  Component Value Date   CHOL 198 01/09/2017   TRIG 128 01/09/2017   HDL 40 (L) 01/09/2017   CHOLHDL 5.0 01/09/2017   VLDL 26 01/09/2017   LDLCALC 132 (H) 01/09/2017    Physical Findings: AIMS: Facial and Oral Movements Muscles of Facial Expression: None, normal Lips and Perioral Area: None, normal Jaw: None, normal Tongue: None, normal,Extremity Movements Upper (arms, wrists, hands, fingers): None, normal Lower (legs, knees, ankles, toes): None, normal, Trunk Movements Neck, shoulders, hips: None, normal, Overall Severity Severity of abnormal movements (highest score from questions above): None, normal Incapacitation due to abnormal movements: None, normal Patient's awareness of abnormal movements (rate only patient's report): No Awareness, Dental Status Current problems with teeth and/or dentures?: No Does patient usually wear dentures?: No  CIWA:    COWS:     Musculoskeletal: Strength & Muscle Tone: within normal limits Gait & Station: normal Patient leans: N/A  Psychiatric Specialty Exam: Physical Exam  Nursing note and vitals reviewed. HENT:  Head: Normocephalic.    Review of Systems  Psychiatric/Behavioral: Positive for depression. The patient is nervous/anxious.   All other systems reviewed and are negative.   Blood pressure (!) 169/83, pulse 84, temperature 98.6 F (37 C), temperature source Oral, resp. rate 20, height 6' 1.5" (1.867 m), weight (!) 199.6 kg (440 lb), SpO2 99 %.Body mass index is 57.26 kg/m.  General Appearance: Casual   Eye Contact:  Good  Speech:  Clear and Coherent  Volume:  Normal  Mood:  Anxious, Depressed and Dysphoric  Affect:  Tearful  Thought Process:  Coherent and Descriptions of Associations: Intact  Orientation:  Full (Time, Place, and Person)  Thought Content:  Rumination  Suicidal Thoughts:  No  Homicidal Thoughts:  No  Memory:  Immediate;   Fair Recent;   Fair Remote;   Fair  Judgement:  Fair  Insight:  Fair  Psychomotor Activity:  Normal  Concentration:  Concentration: Fair and Attention Span: Fair  Recall:  Fair  [employer] of Knowledge:  Fair  Language:  Fair  Akathisia:  No  Handed:  Right  AIMS (if indicated):     Assets:  Communication Skills Desire for Improvement Social Support  ADL's:  Intact  Cognition:  WNL  Sleep:  Number of Hours: 3    Will continue today 01/12/17  plan as below except where it is noted.  Schizoaffective disorder, bipolar type (HCC) UNSTABLE - IMPROVING   Treatment Plan Summary:Patient today is tearful, hopeless, will readjust medications. Daily contact with patient to assess and evaluate symptoms and progress in treatment and Medication management   Patient reports taken a monthly injection (Invega sustenna) - needs to be verified. Restarted Invega 3 mg PO daily. Pt to resume Eyvonne Mechanic on discharge . Continue with Klonopin 1 mg PO TID for mood stabilization. Increase Trazodone to 100 mg po qhs for insomnia. Will restart Effexor XR 37.5 mg po daily for affective sx.  Will continue to monitor vitals ,medication compliance and treatment side effects while patient is here.   Labs reviewed - tsh - wnl, pl elevated - will need to be monitored.  CSW will continue working on disposition.   Patient to participate in therapeutic milieu  Merri Dimaano, MD 01/12/2017, 3:07 PM

## 2017-01-12 NOTE — Plan of Care (Signed)
Problem: Safety: Goal: Ability to remain free from injury will improve Outcome: Progressing Pt is safe and free from injury this shift   

## 2017-01-12 NOTE — Tx Team (Signed)
Interdisciplinary Treatment and Diagnostic Plan Update  01/12/2017 Time of Session: 3:56 PM  Allen Sosa MRN: [id number]  Principal Diagnosis: Schizoaffective disorder, bipolar type ([place])  Secondary Diagnoses: Principal Problem:   Schizoaffective disorder, bipolar type ([place]) Active Problems:   Hypertension   Obesity   Current Medications:  Current Facility-Administered Medications  Medication Dose Route Frequency Provider Last Rate Last Dose  . acetaminophen (TYLENOL) tablet 650 mg  650 mg Oral Q6H PRN Laverle Hobby, PA-C   650 mg at 01/11/17 2010  . alum & mag hydroxide-simeth (MAALOX/MYLANTA) 200-200-20 MG/5ML suspension 30 mL  30 mL Oral Q4H PRN Benjamine Mola, FNP   30 mL at 01/10/17 1923  . [START ON 01/13/2017] amLODipine (NORVASC) tablet 10 mg  10 mg Oral Daily Ursula Alert, MD      . clonazePAM (KLONOPIN) tablet 1 mg  1 mg Oral TID Jenne Campus, MD   1 mg at 01/12/17 1157  . hydrALAZINE (APRESOLINE) tablet 10 mg  10 mg Oral TID Jenne Campus, MD   10 mg at 01/12/17 1157  . hydrochlorothiazide (MICROZIDE) capsule 12.5 mg  12.5 mg Oral Daily [name], MD      . magnesium hydroxide (MILK OF MAGNESIA) suspension 30 mL  30 mL Oral Daily PRN Benjamine Mola, FNP      . paliperidone (INVEGA) 24 hr tablet 3 mg  3 mg Oral QHS Derrill Center, NP   3 mg at 01/11/17 2302  . pantoprazole (PROTONIX) EC tablet 40 mg  40 mg Oral Daily Benjamine Mola, FNP   40 mg at 01/12/17 0742  . traZODone (DESYREL) tablet 100 mg  100 mg Oral QHS Saramma Eappen, MD      . venlafaxine XR (EFFEXOR-XR) 24 hr capsule 37.5 mg  37.5 mg Oral Q breakfast Ursula Alert, MD        PTA Medications: Prescriptions Prior to Admission  Medication Sig Dispense Refill Last Dose  . clonazePAM (KLONOPIN) 2 MG tablet Take 2 mg by mouth 3 (three) times daily.   0 01/05/2017 at Unknown time  . divalproex (DEPAKOTE ER) 500 MG 24 hr tablet Take 500 mg by mouth 2 (two) times daily.  1 01/05/2017 at Unknown  time  . ibuprofen (ADVIL,MOTRIN) 200 MG tablet Take 200-400 mg by mouth every 4 (four) hours as needed for headache (for pain.).    Past Month at Unknown time  . LATUDA 60 MG TABS Take 60 mg by mouth daily.  1 01/05/2017 at Unknown time  . omeprazole (PRILOSEC) 20 MG capsule Take 40 mg by mouth 2 (two) times daily.    01/05/2017 at Unknown time  . Paliperidone Palmitate (INVEGA SUSTENNA IM) Inject into the muscle every 3 (three) months. Patient receives at doctors office and does not know mg   2 months ago at Unknown    Treatment Modalities: Medication Management, Group therapy, Case management,  1 to 1 session with clinician, Psychoeducation, Recreational therapy.   Physician Treatment Plan for Primary Diagnosis: Schizoaffective disorder, bipolar type ([place]) Long Term Goal(s): Improvement in symptoms so as ready for discharge  Short Term Goals: Ability to verbalize feelings will improve Ability to disclose and discuss suicidal ideas Ability to demonstrate self-control will improve Ability to identify and develop effective coping behaviors will improve Ability to maintain clinical measurements within normal limits will improve Ability to verbalize feelings will improve Ability to disclose and discuss suicidal ideas Ability to demonstrate self-control will improve Ability to identify and  develop effective coping behaviors will improve Ability to maintain clinical measurements within normal limits will improve  Medication Management: Evaluate patient's response, side effects, and tolerance of medication regimen.  Therapeutic Interventions: 1 to 1 sessions, Unit Group sessions and Medication administration.  Evaluation of Outcomes: Adequate for Discharge  Physician Treatment Plan for Secondary Diagnosis: Principal Problem:   Schizoaffective disorder, bipolar type ([place]) Active Problems:   Hypertension   Obesity   Long Term Goal(s): Improvement in symptoms so as ready for  discharge  Short Term Goals: Ability to verbalize feelings will improve Ability to disclose and discuss suicidal ideas Ability to demonstrate self-control will improve Ability to identify and develop effective coping behaviors will improve Ability to maintain clinical measurements within normal limits will improve Ability to verbalize feelings will improve Ability to disclose and discuss suicidal ideas Ability to demonstrate self-control will improve Ability to identify and develop effective coping behaviors will improve Ability to maintain clinical measurements within normal limits will improve  Medication Management: Evaluate patient's response, side effects, and tolerance of medication regimen.  Therapeutic Interventions: 1 to 1 sessions, Unit Group sessions and Medication administration.  Evaluation of Outcomes: Adequate for Discharge   RN Treatment Plan for Primary Diagnosis: Schizoaffective disorder, bipolar type ([place]) Long Term Goal(s): Knowledge of disease and therapeutic regimen to maintain health will improve  Short Term Goals: Ability to identify and develop effective coping behaviors will improve and Compliance with prescribed medications will improve  Medication Management: RN will administer medications as ordered by provider, will assess and evaluate patient's response and provide education to patient for prescribed medication. RN will report any adverse and/or side effects to prescribing provider.  Therapeutic Interventions: 1 on 1 counseling sessions, Psychoeducation, Medication administration, Evaluate responses to treatment, Monitor vital signs and CBGs as ordered, Perform/monitor CIWA, COWS, AIMS and Fall Risk screenings as ordered, Perform wound care treatments as ordered.  Evaluation of Outcomes: Adequate for Discharge   LCSW Treatment Plan for Primary Diagnosis: Schizoaffective disorder, bipolar type ([place]) Long Term Goal(s): Safe transition to appropriate next  level of care at discharge, Engage patient in therapeutic group addressing interpersonal concerns.  Short Term Goals: Engage patient in aftercare planning with referrals and resources  Therapeutic Interventions: Assess for all discharge needs, 1 to 1 time with Social worker, Explore available resources and support systems, Assess for adequacy in community support network, Educate family and significant other(s) on suicide prevention, Complete Psychosocial Assessment, Interpersonal group therapy.  Evaluation of Outcomes: Met  Return home, follow up current provider   Progress in Treatment: Attending groups: Yes Participating in groups: Yes Taking medication as prescribed: Yes Toleration medication: Yes, no side effects reported at this time Family/Significant other contact made: Yes Patient understands diagnosis: Yes AEB asking for help with "getting my mood under control" Discussing patient identified problems/goals with staff: Yes Medical problems stabilized or resolved: Yes Denies suicidal/homicidal ideation: Yes Issues/concerns per patient self-inventory: None Other: N/A  New problem(s) identified: None identified at this time.   New Short Term/Long Term Goal(s): None identified at this time.   Discharge Plan or Barriers:   Reason for Continuation of Hospitalization:    Medication stabilization   Estimated Length of Stay: Likely d/c tomorrow  Attendees: Patient: 01/12/2017  3:56 PM  Physician: Ursula Alert, MD 01/12/2017  3:56 PM  Nursing: Hoy Register, RN 01/12/2017  3:56 PM  RN Care Manager: Lars Pinks, RN 01/12/2017  3:56 PM  Social Worker: Ripley Fraise 01/12/2017  3:56 PM  Recreational Therapist: Laretta Bolster  01/12/2017  3:56 PM  Other: Norberto Sorenson 01/12/2017  3:56 PM  Other:  01/12/2017  3:56 PM    Scribe for Treatment Team:  Roque Lias LCSW 01/12/2017 3:56 PM

## 2017-01-12 NOTE — Progress Notes (Signed)
Nursing Note 01/12/2017 0700-1700  Data Reports sleeping good with PRN sleep med.  Rates depression 1/10, hopelessness 1/10, and anxiety 1/10. Affect depressed.  Denies HI, SI.  Baseline auditory hallucinations of conversations per patient.  Denies visual hallucinations.  Patient upset this AM due to a conversation with his wife "I don't think I can go home to my wife, she triggers my illness."  Patient reports feeling anxious today.   Interacting with peers during free time, appropriate with staff, attending groups.  Action Spoke with patient 1:1, nurse offered support to patient throughout shift.  Continues to be monitored on 15 minute checks for safety.  Response Remains safe and appropriate on unit.

## 2017-01-12 NOTE — Progress Notes (Signed)
01/12/2017 2:21 PM  Progress Note  Vitals:   01/12/17 0601 01/12/17 1148  BP: (!) 156/95 (!) 169/83  Pulse: (!) 102 84  Resp:    Temp:     Pt's blood pressure is still suboptimally controlled.  Continue Amlodipine daily and add HCTZ 12.5 mg daily to regimen and follow BP trends.  Maryln Manuel. Johnson, MD [phone] [facility]  [facility] [place], [place]

## 2017-01-12 NOTE — Progress Notes (Signed)
Adult Psychoeducational Group Note  Date:  01/12/2017 Time:  8:42 PM  Group Topic/Focus:  Wrap-Up Group:   The focus of this group is to help patients review their daily goal of treatment and discuss progress on daily workbooks.  Participation Level:  Active  Participation Quality:  Appropriate  Affect:  Appropriate  Cognitive:  Appropriate  Insight: Appropriate  Engagement in Group:  Engaged  Modes of Intervention:  Discussion  Additional Comments: The patient expressed that he day was ok.The patient also said that his strength was faith.  Octavio Mannshigpen, Johnanthony Wilden Lee 01/12/2017, 8:42 PM

## 2017-01-13 MED ORDER — PANTOPRAZOLE SODIUM 40 MG PO TBEC: 40.0000 mg | DELAYED_RELEASE_TABLET | Freq: Every day | ORAL | 0 refills | Status: DC

## 2017-01-13 MED ORDER — HYDRALAZINE HCL 10 MG PO TABS: 10.0000 mg | ORAL_TABLET | Freq: Three times a day (TID) | ORAL | 0 refills | Status: DC

## 2017-01-13 MED ORDER — PALIPERIDONE ER 3 MG PO TB24: 3.0000 mg | ORAL_TABLET | Freq: Every day | ORAL | 0 refills | Status: DC

## 2017-01-13 MED ORDER — AMLODIPINE BESYLATE 10 MG PO TABS: 10.0000 mg | ORAL_TABLET | Freq: Every day | ORAL | 0 refills | Status: DC

## 2017-01-13 MED ORDER — HYDROCHLOROTHIAZIDE 12.5 MG PO CAPS: 12.5000 mg | ORAL_CAPSULE | Freq: Every day | ORAL | 0 refills | Status: DC

## 2017-01-13 MED ORDER — TRAZODONE HCL 100 MG PO TABS: 100.0000 mg | ORAL_TABLET | Freq: Every day | ORAL | 0 refills | Status: DC

## 2017-01-13 MED ORDER — VENLAFAXINE HCL ER 37.5 MG PO CP24: 37.5000 mg | ORAL_CAPSULE | Freq: Every day | ORAL | 0 refills | Status: DC

## 2017-01-13 NOTE — Discharge Summary (Signed)
Physician Discharge Summary Note  Patient:  Allen Sosa is an 36 y.o., male MRN:  [id number] DOB:  [birth year] Patient phone:  [phone] (home)  Patient address:   [address] [place] [address],  Total Time spent with patient: 30 minutes  Date of Admission:  01/08/2017 Date of Discharge: 01/13/2017  Reason for Admission:  Suicidal and homicidal ideations  Principal Problem: Schizoaffective disorder, bipolar type [facility]) Discharge Diagnoses: Patient Active Problem List   Diagnosis Date Noted  . Hypertension [I10] 01/09/2017  . Obesity [E66.9] 01/09/2017  . Schizoaffective disorder, bipolar type (HCC) [F25.0] 01/07/2017  . 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 Abdominal pain [R10.9] 11/20/2014  . Abdominal pain in male [R10.9] 11/18/2014    Past Psychiatric History: see HPI  Past Medical History:  Past Medical History:  Diagnosis Date  . Anxiety   . Depression   . Hyperlipemia   . Hypertension   . Obesity   . Schizo affective schizophrenia (HCC)   . Sleep apnea     Past Surgical History:  Procedure Laterality Date  . VASECTOMY     Family History:  Family History  Problem Relation Age of Onset  . Heart attack Mother   . Cancer Mother   . Cancer Sister    Family Psychiatric  History: see HPI Social History:  History  Alcohol Use No     History  Drug Use No    Social History   Social History  . Marital status: Married    Spouse name: N/A  . Number of children: 1  . Years of education: 29   Occupational History  . [employer]    Social History Main Topics  . Smoking status: Never Smoker  . Smokeless tobacco: Never Used  . Alcohol use No  . Drug use: No  . Sexual activity: Yes    Birth control/ protection: None   Other Topics Concern  . None   Social History Narrative   Drinks caffeine daily     Hospital Course:  Allen Sosa is an 36 y.o. male, who presented  voluntarily and unaccompanied to [facility].  Pt reported, having suicidal and homicidal thoughts for the past day. Pt reported, the voices are demanding him to kill his wife and himself. Pt reported, he has been hearing voices for a long time.    Allen Man V. was admitted for Schizoaffective disorder, bipolar type [facility]) and crisis management.  Patient was treated with medications with their indications listed below in detail under Medication List.  Medical problems were identified and treated as needed.  Home medications were restarted as appropriate.  Improvement was monitored by observation and Allen Man V. daily report of symptom reduction.  Emotional and mental status was monitored by daily self inventory reports completed by Allen Sosa and clinical staff.  Patient reported continued improvement, denied any new concerns.  Patient had been compliant on medications and denied side effects.  Support and encouragement was provided.         Allen Man V. was evaluated by the treatment team for stability and plans for continued recovery upon discharge.  Patient was offered further treatment options upon discharge including Residential, Intensive Outpatient and Outpatient treatment. Patient will follow up with agency listed below for medication management and counseling.  Encouraged patient to maintain satisfactory support network and home environment.  Advised to adhere to medication compliance and outpatient treatment follow up.  Prescriptions provided.  Allen RenMichael Stucke V. motivation was an integral factor for scheduling further treatment.  Employment, transportation, bed availability, health status, family support, and any pending legal issues were also considered during patient's hospital stay.  Upon completion of this admission the patient was both mentally and medically stable for discharge denying suicidal/homicidal ideation, auditory/visual/tactile hallucinations, delusional thoughts and  paranoia.      Physical Findings: AIMS: Facial and Oral Movements Muscles of Facial Expression: None, normal Lips and Perioral Area: None, normal Jaw: None, normal Tongue: None, normal,Extremity Movements Upper (arms, wrists, hands, fingers): None, normal Lower (legs, knees, ankles, toes): None, normal, Trunk Movements Neck, shoulders, hips: None, normal, Overall Severity Severity of abnormal movements (highest score from questions above): None, normal Incapacitation due to abnormal movements: None, normal Patient's awareness of abnormal movements (rate only patient's report): No Awareness, Dental Status Current problems with teeth and/or dentures?: No Does patient usually wear dentures?: No  CIWA:    COWS:     Musculoskeletal: Strength & Muscle Tone: within normal limits Gait & Station: normal Patient leans: N/A  Psychiatric Specialty Exam:  SEE MD SRA Physical Exam  Nursing note and vitals reviewed.   ROS  Blood pressure 139/87, pulse 100, temperature 98.6 F (37 C), temperature source Oral, resp. rate 20, height 6' 1.5" (1.867 m), weight (!) 199.6 kg (440 lb), SpO2 99 %.Body mass index is 57.26 kg/m.    Have you used any form of tobacco in the last 30 days? (Cigarettes, Smokeless Tobacco, Cigars, and/or Pipes): No  Has this patient used any form of tobacco in the last 30 days? (Cigarettes, Smokeless Tobacco, Cigars, and/or Pipes) Yes, N/A  Blood Alcohol level:  Lab Results  Component Value Date   [facility] <5 01/06/2017   ETH <5 01/05/2017    Metabolic Disorder Labs:  Lab Results  Component Value Date   HGBA1C 5.6 01/09/2017   MPG 114 01/09/2017   Lab Results  Component Value Date   PROLACTIN 33.8 (H) 01/09/2017   Lab Results  Component Value Date   CHOL 198 01/09/2017   TRIG 128 01/09/2017   HDL 40 (L) 01/09/2017   CHOLHDL 5.0 01/09/2017   VLDL 26 01/09/2017   LDLCALC 132 (H) 01/09/2017    See Psychiatric Specialty Exam and Suicide Risk Assessment  completed by Attending Physician prior to discharge.  Discharge destination:  Home  Is patient on multiple antipsychotic therapies at discharge:  No   Has Patient had three or more failed trials of antipsychotic monotherapy by history:  No  Recommended Plan for Multiple Antipsychotic Therapies: NA   Allergies as of 01/13/2017   No Known Allergies     Medication List    STOP taking these medications   clonazePAM 2 MG tablet Commonly known as:  KLONOPIN   divalproex 500 MG 24 hr tablet Commonly known as:  DEPAKOTE ER   ibuprofen 200 MG tablet Commonly known as:  ADVIL,MOTRIN   INVEGA SUSTENNA IM   LATUDA 60 MG Tabs Generic drug:  Lurasidone HCl   omeprazole 20 MG capsule Commonly known as:  PRILOSEC Replaced by:  pantoprazole 40 MG tablet     TAKE these medications     Indication  amLODipine 10 MG tablet Commonly known as:  NORVASC Take 1 tablet (10 mg total) by mouth daily. Start taking on:  01/14/2017  Indication:  High Blood Pressure Disorder   hydrALAZINE 10 MG tablet Commonly known as:  APRESOLINE Take 1 tablet (10 mg total) by mouth 3 (three) times daily.  Indication:  High Blood Pressure Disorder   hydrochlorothiazide 12.5 MG capsule Commonly known as:  MICROZIDE Take 1 capsule (12.5 mg total) by mouth daily. Start taking on:  01/14/2017  Indication:  High Blood Pressure Disorder   paliperidone 3 MG 24 hr tablet Commonly known as:  INVEGA Take 1 tablet (3 mg total) by mouth at bedtime.  Indication:  Schizophrenia   pantoprazole 40 MG tablet Commonly known as:  PROTONIX Take 1 tablet (40 mg total) by mouth daily. Start taking on:  01/14/2017 Replaces:  omeprazole 20 MG capsule  Indication:  Gastroesophageal Reflux Disease   traZODone 100 MG tablet Commonly known as:  DESYREL Take 1 tablet (100 mg total) by mouth at bedtime.  Indication:  Trouble Sleeping   venlafaxine XR 37.5 MG 24 hr capsule Commonly known as:  EFFEXOR-XR Take 1 capsule  (37.5 mg total) by mouth daily with breakfast. Start taking on:  01/14/2017  Indication:  Major Depressive Disorder      Follow-up Information    [facility] Follow up on 01/26/2017.   Why:  Friday at 4:40 with Dr Mitzi Hansen. This is when you are due for your injection again. Contact information: [address] [place] [phone]          Follow-up recommendations:  Activity:  as tol Diet:  as tol  Comments:  1.  Take all your medications as prescribed.   2.  Report any adverse side effects to outpatient provider. 3.  Patient instructed to not use alcohol or illegal drugs while on prescription medicines. 4.  In the event of worsening symptoms, instructed patient to call 911, the crisis hotline or go to nearest emergency room for evaluation of symptoms.  Signed: Lindwood Qua, NP [facility] 01/13/2017, 9:59 AM

## 2017-01-13 NOTE — Progress Notes (Signed)
Patient ID: Allen RenMichael Kalish V., male   DOB: Oct 29, 1981, 36 y.o.   MRN: [id number]    Pt reported that he was ready for discharge, patient was discharged home with girlfriend. Pt reported that his depression was a 1, his hopelessness was a 1, and his anxiety was a 1. Pt reported that he was negative SI/HI, no AH/VH noted. Pt reported that his goal for today was to go home.
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
## 2017-01-13 NOTE — B[facility] Group Notes (Signed)
[facility] Group Notes:  (Clinical Social Work)  01/13/2017  11:15-12:00PM  Summary of Progress/Problems:   Today's process group involved patients discussing their plans on how to stay well once they are discharge from the hospital, from taking medicine, to exercising, to seeing their doctor, and more.   The patient expressed that he will stay well by taking his medications on time and staying positive with the use of positive thinking and music.  He was very supportive of others in the group and had a long discussion with them about the subject of medication without the intervention of CSW.  Type of Therapy:  Group Therapy - Process  Participation Level:  Active  Participation Quality:  Attentive, Sharing and Supportive  Affect:  Appropriate  Cognitive:  Appropriate  Insight:  Engaged  Engagement in Therapy:  Engaged  Modes of Intervention:  Exploration, Discussion  Ambrose MantleMareida Grossman-Orr, LCSW 01/13/2017, 1:08 PM

## 2017-03-22 ENCOUNTER — Emergency Department (HOSPITAL_COMMUNITY)
Admission: EM | Admit: 2017-03-22 | Discharge: 2017-03-22 | Disposition: A | Discharge status: Home / Self Care | Payer: Medicaid Other | Attending: Emergency Medicine | Admitting: Emergency Medicine

## 2017-03-22 ENCOUNTER — Encounter (HOSPITAL_COMMUNITY): Payer: Self-pay | Admitting: Emergency Medicine

## 2017-03-22 DIAGNOSIS — R45851 Suicidal ideations: Secondary | ICD-10-CM | POA: Diagnosis present

## 2017-03-22 DIAGNOSIS — Z79899 Other long term (current) drug therapy: Secondary | ICD-10-CM | POA: Insufficient documentation

## 2017-03-22 DIAGNOSIS — I10 Essential (primary) hypertension: Secondary | ICD-10-CM | POA: Diagnosis not present

## 2017-03-22 DIAGNOSIS — F329 Major depressive disorder, single episode, unspecified: Secondary | ICD-10-CM | POA: Diagnosis not present

## 2017-03-22 DIAGNOSIS — F251 Schizoaffective disorder, depressive type: Secondary | ICD-10-CM

## 2017-03-22 LAB — COMPREHENSIVE METABOLIC PANEL
ALBUMIN: 3.7 g/dL (ref 3.5–5.0)
ALK PHOS: 66 U/L (ref 38–126)
ALT: 24 U/L (ref 17–63)
AST: 29 U/L (ref 15–41)
Anion gap: 11 (ref 5–15)
BILIRUBIN TOTAL: 0.8 mg/dL (ref 0.3–1.2)
BUN: 10 mg/dL (ref 6–20)
CO2: 25 mmol/L (ref 22–32)
CREATININE: 1.25 mg/dL — AB (ref 0.61–1.24)
Calcium: 9.4 mg/dL (ref 8.9–10.3)
Chloride: 101 mmol/L (ref 101–111)
GFR calc Af Amer: >60 mL/min (ref >60)
GLUCOSE: 112 mg/dL — AB (ref 65–99)
POTASSIUM: 3.7 mmol/L (ref 3.5–5.1)
Sodium: 137 mmol/L (ref 135–145)
TOTAL PROTEIN: 6.7 g/dL (ref 6.5–8.1)

## 2017-03-22 LAB — CBC
HEMATOCRIT: 38.2 % — AB (ref 39.0–52.0)
Hemoglobin: 13 g/dL (ref 13.0–17.0)
MCH: 28.1 pg (ref 26.0–34.0)
MCHC: 34 g/dL (ref 30.0–36.0)
MCV: 82.7 fL (ref 78.0–100.0)
PLATELETS: 196 10*3/uL (ref 150–400)
RBC: 4.62 MIL/uL (ref 4.22–5.81)
RDW: 14.3 % (ref 11.5–15.5)
WBC: 6.8 10*3/uL (ref 4.0–10.5)

## 2017-03-22 LAB — ETHANOL: Alcohol, Ethyl (B): <5 mg/dL (ref <5)

## 2017-03-22 LAB — RAPID URINE DRUG SCREEN, HOSP PERFORMED
AMPHETAMINES: NOT DETECTED
BARBITURATES: NOT DETECTED
Benzodiazepines: NOT DETECTED
Cocaine: NOT DETECTED
Opiates: NOT DETECTED
Tetrahydrocannabinol: NOT DETECTED

## 2017-03-22 LAB — ACETAMINOPHEN LEVEL: Acetaminophen (Tylenol), Serum: <10 ug/mL — ABNORMAL LOW (ref 10–30)

## 2017-03-22 LAB — SALICYLATE LEVEL: Salicylate Lvl: <7 mg/dL (ref 2.8–30.0)

## 2017-03-22 MED ORDER — TRAZODONE HCL 100 MG PO TABS: 100.0000 mg | ORAL_TABLET | Freq: Every day | ORAL | Status: DC

## 2017-03-22 MED ORDER — AMLODIPINE BESYLATE 5 MG PO TABS
10.0000 mg | ORAL_TABLET | Freq: Every day | ORAL | Status: DC
  Administered 2017-03-22: 10 mg via ORAL
  Filled 2017-03-22: qty 2

## 2017-03-22 MED ORDER — HYDRALAZINE HCL 10 MG PO TABS
10.0000 mg | ORAL_TABLET | Freq: Three times a day (TID) | ORAL | Status: DC
  Administered 2017-03-22: 10 mg via ORAL
  Filled 2017-03-22 (×2): qty 1

## 2017-03-22 MED ORDER — PALIPERIDONE ER 3 MG PO TB24
3.0000 mg | ORAL_TABLET | Freq: Every day | ORAL | Status: DC
Start: 2017-03-22 — End: 2017-03-22

## 2017-03-22 MED ORDER — VENLAFAXINE HCL ER 37.5 MG PO CP24
37.5000 mg | ORAL_CAPSULE | Freq: Every day | ORAL | Status: DC
  Administered 2017-03-22: 37.5 mg via ORAL
  Filled 2017-03-22: qty 1

## 2017-03-22 MED ORDER — PANTOPRAZOLE SODIUM 40 MG PO TBEC
40.0000 mg | DELAYED_RELEASE_TABLET | Freq: Every day | ORAL | Status: DC
Start: 2017-03-22 — End: 2017-03-22
  Administered 2017-03-22: 40 mg via ORAL
  Filled 2017-03-22: qty 1

## 2017-03-22 MED ORDER — HYDROCHLOROTHIAZIDE 12.5 MG PO CAPS
12.5000 mg | ORAL_CAPSULE | Freq: Every day | ORAL | Status: DC
Start: 2017-03-22 — End: 2017-03-22
  Administered 2017-03-22: 12.5 mg via ORAL
  Filled 2017-03-22: qty 1

## 2017-03-22 NOTE — ED Triage Notes (Signed)
Pt reports wanting to kill himself since his mother died, hx of schizoaffective disorder. States has a plan to either "blow my brains out or take a lot of medication." when asked if patient has thoughts of hurting anyone other than himself, he states "the world."

## 2017-03-22 NOTE — ED Notes (Signed)
Got patient a sprite patient is resting

## 2017-03-22 NOTE — Discharge Instructions (Signed)
Follow up with out pt tx °

## 2017-03-22 NOTE — BH Assessment (Signed)
Tele Assessment Note  Patient was assessed by the writer and Dr. Lucianne Muss.  Patient is a 36 year old male that denies SI/HI/Psychosis.  Patient reports having an argument with his wife and then coming to the ED.  Patient acknowledged making statement to harm himself that he did not mean.  Patient reports that he was just upset.   Patient reports receiving injections of his psychiatric medication from [facility] and his psychiatrist is Dr. Margot Chimes.  Patent is able to follow up with his current provider.   Writer received collateral information from his wife who reports that he has not made any suicidal statements, there are no guns in the home and she is fine with the patient returning home.   Per Dr. Lucianne Muss patient does not meet criteria for inpatient hospitalization.    Diagnosis: Schizophrenia   Past Medical History:  Past Medical History:  Diagnosis Date  . Anxiety   . Depression   . Hyperlipemia   . Hypertension   . Obesity   . Schizo affective schizophrenia (HCC)   . Sleep apnea     Past Surgical History:  Procedure Laterality Date  . VASECTOMY      Family History:  Family History  Problem Relation Age of Onset  . Heart attack Mother   . Cancer Mother   . Cancer Sister     Social History:  reports that he has never smoked. He has never used smokeless tobacco. He reports that he does not drink alcohol or use drugs.  Additional Social History:  Alcohol / Drug Use History of alcohol / drug use?: No history of alcohol / drug abuse  CIWA: CIWA-Ar BP: 130/82 Pulse Rate: 75 COWS:    PATIENT STRENGTHS: (choose at least two) Average or above average intelligence Capable of independent living Physical Health  Allergies: No Known Allergies  Home Medications:  (Not in a hospital admission)  OB/GYN Status:  No LMP for male patient.  General Assessment Data Location of Assessment: [facility] Assessment Services (Walk In at [facility] ) TTS Assessment: In system Is  this a Tele or Face-to-Face Assessment?: Face-to-Face Is this an Initial Assessment or a Re-assessment for this encounter?: Initial Assessment Marital status: Single Maiden name: NA Is patient pregnant?: No Pregnancy Status: No Living Arrangements: Spouse/significant other Can pt return to current living arrangement?: Yes Admission Status: Voluntary Is patient capable of signing voluntary admission?: Yes Referral Source: Self/Family/Friend Insurance type: Self Pay  Medical Screening Exam [facility] Walk-in ONLY) Medical Exam completed: Yes (Dr. Lucianne Muss completed MSE Exams)  Crisis Care Plan Living Arrangements: Spouse/significant other Legal Guardian:  (NA) Name of Psychiatrist: Dr. Darlyn Chamber  Name of Therapist: None Reported  Education Status Is patient currently in school?: No Current Grade: NA Highest grade of school patient has completed: NA Name of school: NA Contact person: NA  Risk to self with the past 6 months Suicidal Ideation: No Has patient been a risk to self within the past 6 months prior to admission? : No Suicidal Intent: No Has patient had any suicidal intent within the past 6 months prior to admission? : No Is patient at risk for suicide?: No Suicidal Plan?: No Has patient had any suicidal plan within the past 6 months prior to admission? : No Access to Means: No What has been your use of drugs/alcohol within the last 12 months?: None Reported Previous Attempts/Gestures: No How many times?: 1 Other Self Harm Risks: None Reported Triggers for Past Attempts: Unpredictable Intentional Self Injurious  Behavior: None Family Suicide History: No Recent stressful life event(s): Conflict (Comment) (Argument with his wife due to finances) Persecutory voices/beliefs?: No Depression: Yes Depression Symptoms: Guilt Substance abuse history and/or treatment for substance abuse?: No Suicide prevention information given to non-admitted patients: Not applicable  Risk  to Others within the past 6 months Homicidal Ideation: No Does patient have any lifetime risk of violence toward others beyond the six months prior to admission? : No Thoughts of Harm to Others: No Current Homicidal Intent: No Current Homicidal Plan: No Access to Homicidal Means: No Identified Victim: None Reported History of harm to others?: No Assessment of Violence: None Noted Violent Behavior Description: NA Does patient have access to weapons?: No Criminal Charges Pending?: No Does patient have a court date: No Is patient on probation?: No  Psychosis Hallucinations: None noted Delusions: None noted  Mental Status Report Appearance/Hygiene: In scrubs Eye Contact: Good Motor Activity: Freedom of movement Speech: Logical/coherent Level of Consciousness: Alert Mood: Depressed, Suspicious, Despair Affect: Appropriate to circumstance Anxiety Level: Minimal Thought Processes: Relevant, Coherent Judgement: Unimpaired Orientation: Person, Place, Time, Situation Obsessive Compulsive Thoughts/Behaviors: None  Cognitive Functioning Concentration: Decreased Memory: Recent Intact, Remote Intact IQ: Average Insight: Good Impulse Control: Good Appetite: Good Weight Loss: 0 Weight Gain: 0 Sleep: No Change Total Hours of Sleep: 8 Vegetative Symptoms: None  ADLScreening [facility] Assessment Services) Patient's cognitive ability adequate to safely complete daily activities?: Yes Patient able to express need for assistance with ADLs?: Yes Independently performs ADLs?: Yes (appropriate for developmental age)  Prior Inpatient Therapy Prior Inpatient Therapy: Yes Prior Therapy Dates: Jan 2018 [facility] Reason for Treatment: Psychosis  Prior Outpatient Therapy Prior Outpatient Therapy: Yes Prior Therapy Dates: Ongoing [facility] Reason for Treatment: Medication Mgt (Dr. Margot ChimesHeadden ) Does patient  have an ACCT team?: No Does patient have Intensive In-House Services?  : No Does patient have [facility] services? : No Does patient have P4CC services?: No  ADL Screening (condition at time of admission) Patient's cognitive ability adequate to safely complete daily activities?: Yes Is the patient deaf or have difficulty hearing?: No Does the patient have difficulty seeing, even when wearing glasses/contacts?: No Does the patient have difficulty concentrating, remembering, or making decisions?: No Patient able to express need for assistance with ADLs?: Yes Does the patient have difficulty dressing or bathing?: No Independently performs ADLs?: Yes (appropriate for developmental age) Does the patient have difficulty walking or climbing stairs?: No Weakness of Legs: None Weakness of Arms/Hands: None  Home Assistive Devices/Equipment Home Assistive Devices/Equipment: None          Advance Directives (For Healthcare) Does Patient Have a Medical Advance Directive?: No Would patient like information on creating a medical advance directive?: No - Patient declined    Additional Information 1:1 In Past 12 Months?: No CIRT Risk: No Elopement Risk: No Does patient have medical clearance?: Yes     Disposition: Per Dr. Lucianne MussKumar patient does not meet criteria for inpatient hospitalization.    Writer informed ER RN and Dr. Wynell BalloonZammet of the disposition  Disposition Initial Assessment Completed for this Encounter: Yes Disposition of Patient: Outpatient treatment  Linton RumpStevenson, Yaneli Keithley LaVerne 03/22/2017 11:47 AM

## 2017-03-22 NOTE — ED Notes (Signed)
Patient changed into burgundy scrubs, security called to wand pt. Belongings removed from patient's possession. Staffing made aware of need for suicide sitter.

## 2017-03-22 NOTE — ED Provider Notes (Signed)
[facility]EM[facility]Y DEPT Provider Note   CSN: [id number] Arrival date & time: 03/22/17  0411     History   Chief Complaint Chief Complaint  Patient presents with  . Suicidal    HPI Allen RenMichael Tostenson V. is a 36 y.o. male.  Patient w hx schizoaffective disorder, with increased anxiety, stress, depression.  States his mom passed away 2+years ago and he has been thinking a lot about her.  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  Denies specific acute inciting event.  States intermittently compliant w meds, no recent change. States when he takes them, he feels like they help. Has a primary outpt psychiatrist.  Denies any recent physical illness. Is eating and drinking normally, normal appetite. +thoughts about suicide, but denies plan or attempt at self harm. occ etoh use, denies daily use, or other drug use.    The history is provided by the patient.    Past Medical History:  Diagnosis Date  . Anxiety   . Depression   . Hyperlipemia   . Hypertension   . Obesity   . Schizo affective schizophrenia (HCC)   . Sleep apnea     Patient Active Problem List   Diagnosis Date Noted  . Hypertension 01/09/2017  . Obesity 01/09/2017  . Schizoaffective disorder, bipolar type (HCC) 01/07/2017  . 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 Abdominal pain 11/20/2014  . Abdominal pain in male 11/18/2014    Past Surgical History:  Procedure Laterality Date  . VASECTOMY         Home Medications    Prior to Admission medications   Medication Sig Start Date End Date Taking? Authorizing Provider  amLODipine (NORVASC) 10 MG tablet Take 1 tablet (10 mg total) by mouth daily. 01/14/17   Adonis BrookSheila Agustin, NP  hydrALAZINE (APRESOLINE) 10 MG tablet Take 1 tablet (10 mg total) by mouth 3 (three) times daily. 01/13/17   Adonis BrookSheila Agustin, NP  hydrochlorothiazide (MICROZIDE) 12.5 MG capsule Take 1 capsule (12.5 mg total)  by mouth daily. 01/14/17   Adonis BrookSheila Agustin, NP  paliperidone (INVEGA) 3 MG 24 hr tablet Take 1 tablet (3 mg total) by mouth at bedtime. 01/13/17   Adonis BrookSheila Agustin, NP  pantoprazole (PROTONIX) 40 MG tablet Take 1 tablet (40 mg total) by mouth daily. 01/14/17   Adonis BrookSheila Agustin, NP  traZODone (DESYREL) 100 MG tablet Take 1 tablet (100 mg total) by mouth at bedtime. 01/13/17   Adonis BrookSheila Agustin, NP  venlafaxine XR (EFFEXOR-XR) 37.5 MG 24 hr capsule Take 1 capsule (37.5 mg total) by mouth daily with breakfast. 01/14/17   Adonis BrookSheila Agustin, NP    Family History Family History  Problem Relation Age of Onset  . Heart attack Mother   . Cancer Mother   . Cancer Sister     Social History Social History  Substance Use Topics  . Smoking status: Never Smoker  . Smokeless tobacco: Never Used  . Alcohol use No     Allergies   Patient has no known allergies.   Review of Systems Review of Systems  Constitutional: Negative for fever.  HENT: Negative for sore throat.   Eyes: Negative for redness.  Respiratory: Negative for shortness of breath.   Cardiovascular: Negative for chest pain.  Gastrointestinal: Negative for abdominal pain and vomiting.  Genitourinary: Negative for flank pain.  Musculoskeletal: Negative for back pain and neck pain.  Skin: Negative for rash.  Neurological: Negative for  headaches.  Hematological: Does not bruise/bleed easily.  Psychiatric/Behavioral: Positive for dysphoric mood. The patient is nervous/anxious.      Physical Exam Updated Vital Signs BP (!) 164/103   Pulse 80   Temp 98.2 F (36.8 C)   Resp 20   SpO2 97%   Physical Exam  Constitutional: He is oriented to person, place, and time. He appears well-developed and well-nourished. No distress.  HENT:  Head: Atraumatic.  Mouth/Throat: Oropharynx is clear and moist.  Eyes: Conjunctivae are normal. Pupils are equal, round, and reactive to light.  Neck: Neck supple. No tracheal deviation present. No thyromegaly  present.  Cardiovascular: Normal rate, regular rhythm, normal heart sounds and intact distal pulses.   Pulmonary/Chest: Effort normal and breath sounds normal. No accessory muscle usage. No respiratory distress.  Abdominal: Soft. Bowel sounds are normal. He exhibits no distension. There is no tenderness.  Musculoskeletal: He exhibits no edema.  Neurological: He is alert and oriented to person, place, and time.  Speech clear/fluent. Ambulates w steady gait.   Skin: Skin is warm and dry. He is not diaphoretic.  Psychiatric:  Depressed mood.   Nursing note and vitals reviewed.    ED Treatments / Results  Labs (all labs ordered are listed, but only abnormal results are displayed) Results for orders placed or performed during the hospital encounter of 03/22/17  Comprehensive metabolic panel  Result Value Ref Range   Sodium 137 135 - 145 mmol/L   Potassium 3.7 3.5 - 5.1 mmol/L   Chloride 101 101 - 111 mmol/L   CO2 25 22 - 32 mmol/L   Glucose, Bld 112 (H) 65 - 99 mg/dL   BUN 10 6 - 20 mg/dL   Creatinine, Ser 1.66 (H) 0.61 - 1.24 mg/dL   Calcium 9.4 8.9 - 06.3 mg/dL   Total Protein 6.7 6.5 - 8.1 g/dL   Albumin 3.7 3.5 - 5.0 g/dL   AST 29 15 - 41 U/L   ALT 24 17 - 63 U/L   Alkaline Phosphatase 66 38 - 126 U/L   Total Bilirubin 0.8 0.3 - 1.2 mg/dL   GFR calc non Af Amer >60 >60 mL/min   GFR calc Af Amer >60 >60 mL/min   Anion gap 11 5 - 15  Ethanol  Result Value Ref Range   Alcohol, Ethyl (B) <5 <5 mg/dL  Salicylate level  Result Value Ref Range   Salicylate Lvl <7.0 2.8 - 30.0 mg/dL  Acetaminophen level  Result Value Ref Range   Acetaminophen (Tylenol), Serum <10 (L) 10 - 30 ug/mL  cbc  Result Value Ref Range   WBC 6.8 4.0 - 10.5 K/uL   RBC 4.62 4.22 - 5.81 MIL/uL   Hemoglobin 13.0 13.0 - 17.0 g/dL   HCT 01.6 (L) 01.0 - 93.2 %   MCV 82.7 78.0 - 100.0 fL   MCH 28.1 26.0 - 34.0 pg   MCHC 34.0 30.0 - 36.0 g/dL   RDW 35.5 73.2 - 20.2 %   Platelets 196 150 - 400 K/uL  Rapid  urine drug screen (hospital performed)  Result Value Ref Range   Opiates NONE DETECTED NONE DETECTED   Cocaine NONE DETECTED NONE DETECTED   Benzodiazepines NONE DETECTED NONE DETECTED   Amphetamines NONE DETECTED NONE DETECTED   Tetrahydrocannabinol NONE DETECTED NONE DETECTED   Barbiturates NONE DETECTED NONE DETECTED    EKG  EKG Interpretation None       Radiology No results found.  Procedures Procedures (including critical care time)  Medications  Ordered in ED Medications  amLODipine (NORVASC) tablet 10 mg (not administered)  hydrALAZINE (APRESOLINE) tablet 10 mg (not administered)  hydrochlorothiazide (MICROZIDE) capsule 12.5 mg (not administered)  paliperidone (INVEGA) 24 hr tablet 3 mg (not administered)  pantoprazole (PROTONIX) EC tablet 40 mg (not administered)  traZODone (DESYREL) tablet 100 mg (not administered)  venlafaxine XR (EFFEXOR-XR) 24 hr capsule 37.5 mg (not administered)     Initial Impression / Assessment and Plan / ED Course  I have reviewed the triage vital signs and the nursing notes.  Pertinent labs & imaging results that were available during my care of the patient were reviewed by me and considered in my medical decision making (see chart for details).  Labs sent.   Behavioral health team consulted.   Pt asking for food tray.   Ordered home meds, diet.   Reviewed nursing notes and prior charts for additional history.   Disposition per [facility] team.    Final Clinical Impressions(s) / ED Diagnoses   Final diagnoses:  None    New Prescriptions New Prescriptions   No medications on file     Cathren Laine, MD 03/27/17 2256

## 2017-11-04 ENCOUNTER — Emergency Department (HOSPITAL_COMMUNITY)
Admission: EM | Admit: 2017-11-04 | Discharge: 2017-11-04 | Disposition: A | Discharge status: Home / Self Care | Payer: Medicaid Other | Attending: Emergency Medicine | Admitting: Emergency Medicine

## 2017-11-04 ENCOUNTER — Emergency Department (HOSPITAL_COMMUNITY): Payer: Medicaid Other

## 2017-11-04 ENCOUNTER — Encounter (HOSPITAL_COMMUNITY): Payer: Self-pay

## 2017-11-04 DIAGNOSIS — F430 Acute stress reaction: Secondary | ICD-10-CM | POA: Insufficient documentation

## 2017-11-04 DIAGNOSIS — R079 Chest pain, unspecified: Secondary | ICD-10-CM | POA: Diagnosis present

## 2017-11-04 DIAGNOSIS — Z79899 Other long term (current) drug therapy: Secondary | ICD-10-CM | POA: Diagnosis not present

## 2017-11-04 DIAGNOSIS — R0789 Other chest pain: Secondary | ICD-10-CM | POA: Insufficient documentation

## 2017-11-04 DIAGNOSIS — I10 Essential (primary) hypertension: Secondary | ICD-10-CM | POA: Diagnosis not present

## 2017-11-04 DIAGNOSIS — F439 Reaction to severe stress, unspecified: Secondary | ICD-10-CM

## 2017-11-04 LAB — CBC
HCT: 37.9 % — ABNORMAL LOW (ref 39.0–52.0)
Hemoglobin: 13.1 g/dL (ref 13.0–17.0)
MCH: 28.9 pg (ref 26.0–34.0)
MCHC: 34.6 g/dL (ref 30.0–36.0)
MCV: 83.7 fL (ref 78.0–100.0)
PLATELETS: 202 10*3/uL (ref 150–400)
RBC: 4.53 MIL/uL (ref 4.22–5.81)
RDW: 13.4 % (ref 11.5–15.5)
WBC: 6 10*3/uL (ref 4.0–10.5)

## 2017-11-04 LAB — BASIC METABOLIC PANEL
Anion gap: 9 (ref 5–15)
BUN: 10 mg/dL (ref 6–20)
CALCIUM: 9 mg/dL (ref 8.9–10.3)
CO2: 26 mmol/L (ref 22–32)
CREATININE: 1.26 mg/dL — AB (ref 0.61–1.24)
Chloride: 101 mmol/L (ref 101–111)
Glucose, Bld: 100 mg/dL — ABNORMAL HIGH (ref 65–99)
Potassium: 3.8 mmol/L (ref 3.5–5.1)
Sodium: 136 mmol/L (ref 135–145)

## 2017-11-04 LAB — I-STAT TROPONIN, ED: TROPONIN I, POC: 0 ng/mL (ref 0.00–0.08)

## 2017-11-04 MED ORDER — HYDRALAZINE HCL 10 MG PO TABS: 10.0000 mg | ORAL_TABLET | Freq: Three times a day (TID) | ORAL | 1 refills | Status: DC

## 2017-11-04 MED ORDER — HYDROCHLOROTHIAZIDE 12.5 MG PO CAPS: 12.5000 mg | ORAL_CAPSULE | Freq: Every day | ORAL | 1 refills | Status: DC

## 2017-11-04 MED ORDER — CLONAZEPAM 2 MG PO TABS: 2.0000 mg | ORAL_TABLET | Freq: Three times a day (TID) | ORAL | 0 refills | Status: DC

## 2017-11-04 MED ORDER — HYDROCHLOROTHIAZIDE 12.5 MG PO CAPS
12.5000 mg | ORAL_CAPSULE | Freq: Once | ORAL | Status: AC
  Administered 2017-11-04: 12.5 mg via ORAL
  Filled 2017-11-04: qty 1

## 2017-11-04 MED ORDER — AMLODIPINE BESYLATE 5 MG PO TABS
10.0000 mg | ORAL_TABLET | Freq: Once | ORAL | Status: AC
  Administered 2017-11-04: 10 mg via ORAL
  Filled 2017-11-04: qty 2

## 2017-11-04 MED ORDER — HYDRALAZINE HCL 10 MG PO TABS
10.0000 mg | ORAL_TABLET | Freq: Once | ORAL | Status: AC
  Administered 2017-11-04: 10 mg via ORAL
  Filled 2017-11-04: qty 1

## 2017-11-04 MED ORDER — LORAZEPAM 1 MG PO TABS
1.0000 mg | ORAL_TABLET | Freq: Once | ORAL | Status: AC
  Administered 2017-11-04: 1 mg via ORAL
  Filled 2017-11-04: qty 1

## 2017-11-04 MED ORDER — AMLODIPINE BESYLATE 10 MG PO TABS: 10.0000 mg | ORAL_TABLET | Freq: Every day | ORAL | 1 refills | Status: DC

## 2017-11-04 NOTE — BH Assessment (Signed)
Tele Assessment Note   Patient Name: Allen RenMichael Pecina V. MRN: [id number] Referring Physician: EDP Location of Patient: [facility] Location of Provider: Behavioral Health TTS Department  Allen RenMichael Cavanaugh V. is a 36 y.o. male [facility].  Pt was examined.  During examination, Pt shrugged when asked about suicidal ideation.  Pt has a history of Schizoaffective Disorder, Bipolar Type, and due to history of previous inpatient treatment for mental health concerns, a TTS consult was called in.  Pt was last assessed by TTS in March 2018.  At that time, Pt reported auditory hallucinations and fleeting suicidal ideation.  He was discharged to his outpatient provider.  Pt provided history.  Pt reported that he is currently under the care of [facility].  Dr. Margot ChimesHeadden prescribes his medications, including Klonipin.  Pt stated that he ran out of his Klonopin, and this is causing him to have auditory hallucinations (voices without command).  Pt denied suicidal ideation, homicidal ideation, visual hallucination, self-injurious behavior, and substance use concerns.  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  Pt requested assistance in finding an outpatient therapist.  His next appointment with Dr. Margot ChimesHeadden is in two weeks.  Pt reported a history of suicide attempt (2x). His last inpatient hospitalization was January 2018 -- at that time, [facility] ideation and auditory hallucination.    During assessment, Pt presented as alert and oriented.  He had good eye contact and was cooperative.  Pt was dressed in scrubs and appeared appropriately groomed.  Pt's mood was sad.  Affect was calm and pleasant.  Pt denied SI, HI, VH, self-injury, and substance use.  Pt's speech was normal in rate, rhythm, and volume.  Pt's thought processes were within normal range, and thought  content was logical and goal-oriented.  There was no evidence of delusion.  Pt's memory and concentration were grossly intact.  Impulse control, judgment, and insight were fair to good.  Consulted with Julian Hy. Okonkwo, NP.  She recommended Pt be discharged from hospital with follow-up to his current outpatient provider (Dr. Margot ChimesHeadden).  She also recommended that, if possible, hospital EDP prescribe two weeks of Klonopin.  Author will fax a list of outpatient therapists.  Diagnosis: Schizoaffective Disorder  Past Medical History:  Past Medical History:  Diagnosis Date  . Anxiety   . Depression   . Hyperlipemia   . Hypertension   . Obesity   . Schizo affective schizophrenia (HCC)   . Sleep apnea     Past Surgical History:  Procedure Laterality Date  . VASECTOMY      Family History:  Family History  Problem Relation Age of Onset  . Heart attack Mother   . Cancer Mother   . Cancer Sister     Social History:  reports that  has never smoked. he has never used smokeless tobacco. He reports that he does not drink alcohol or use drugs.  Additional Social History:  Alcohol / Drug Use Pain Medications: See MAR Prescriptions: See MAR Over the Counter: See MAR History of alcohol / drug use?: No history of alcohol / drug abuse  CIWA: CIWA-Ar BP: (!) 207/113 Pulse Rate: 77 COWS:    PATIENT STRENGTHS: (choose at least two) Average or above average intelligence Capable of independent living  Allergies: No Known Allergies  Home Medications:  (Not in a hospital admission)  OB/GYN Status:  No LMP for male patient.  General Assessment Data Location of Assessment: [facility] ED TTS Assessment: In system Is this a Tele or Face-to-Face Assessment?: Tele Assessment Is this an Initial Assessment or a Re-assessment for this encounter?: Initial Assessment Marital status: Married Is patient pregnant?: No Pregnancy Status: No Living Arrangements: Spouse/significant other Can pt return to current  living arrangement?: Yes Admission Status: Voluntary Is patient capable of signing voluntary admission?: Yes Referral Source: Self/Family/Friend Insurance type: [place]     Crisis Care Plan Living Arrangements: Spouse/significant other Name of Psychiatrist: Dr. Headden([facility]) Name of Therapist: None currently  Education Status Is patient currently in school?: No  Risk to self with the past 6 months Suicidal Ideation: No Has patient been a risk to self within the past 6 months prior to admission? : No Suicidal Intent: No Has patient had any suicidal intent within the past 6 months prior to admission? : No Is patient at risk for suicide?: No Suicidal Plan?: No Has patient had any suicidal plan within the past 6 months prior to admission? : No Access to Means: No What has been your use of drugs/alcohol within the last 12 months?: Denied Previous Attempts/Gestures: Yes How many times?: 2 Other Self Harm Risks: Not compliant with all meds Triggers for Past Attempts: Family contact Intentional Self Injurious Behavior: None Family Suicide History: No Recent stressful life event(s): Conflict (Comment)(Conflict with wife) Persecutory voices/beliefs?: No Depression: Yes Depression Symptoms: Despondent Substance abuse history and/or treatment for substance abuse?: No Suicide prevention information given to non-admitted patients: Not applicable  Risk to Others within the past 6 months Homicidal Ideation: No Does patient have any lifetime risk of violence toward others beyond the six months prior to admission? : No Thoughts of Harm to Others: No Current Homicidal Intent: No Current Homicidal Plan: No Access to Homicidal Means: No History of harm to others?: No Assessment of Violence: None Noted Does patient have access to weapons?: No Criminal Charges Pending?: No Does patient have a court date: No Is patient on probation?: No  Psychosis Hallucinations:  Auditory(Reported voices starting) Delusions: None noted  Mental Status Report Appearance/Hygiene: In scrubs, Unremarkable Eye Contact: Good Motor Activity: Freedom of movement, Unremarkable Speech: Logical/coherent Level of Consciousness: Alert Mood: Sad Affect: Appropriate to circumstance Anxiety Level: None Thought Processes: Coherent, Relevant Judgement: Unimpaired Orientation: Person, Place, Time, Situation Obsessive Compulsive Thoughts/Behaviors: None  Cognitive Functioning Concentration: Normal Memory: Recent Intact, Remote Intact IQ: Average Insight: Good Impulse Control: Good Appetite: Good Sleep: No Change Total Hours of Sleep: (8 hours when on medication) Vegetative Symptoms: None  ADLScreening [facility] Assessment Services) Patient's cognitive ability adequate to safely complete daily activities?: Yes Patient able to express need for assistance with ADLs?: Yes Independently performs ADLs?: Yes (appropriate for developmental age)  Prior Inpatient Therapy Prior Inpatient Therapy: Yes Prior Therapy Dates: 2018 Prior Therapy Facilty/Provider(s): [facility] Reason for Treatment: Schizoaffective Disorder, Bipolar Type  Prior Outpatient Therapy Prior Outpatient Therapy: Yes Prior Therapy Dates: Ongoing Prior Therapy Facilty/Provider(s): [facility] Reason for Treatment: Schizoaffective Disorder Does patient have an ACCT team?: No Does patient have Intensive In-House Services?  : No Does patient have [facility] services? : No Does patient have P4CC services?: No  ADL Screening (condition at time of admission) Patient's cognitive ability adequate to safely complete daily activities?: Yes Is the patient deaf or have difficulty hearing?: No Does the patient have difficulty seeing, even when wearing glasses/contacts?: No Does the patient have difficulty concentrating, remembering,  or making decisions?: No Patient able to express need for assistance with ADLs?: Yes Does the  patient have difficulty dressing or bathing?: No Independently performs ADLs?: Yes (appropriate for developmental age) Does the patient have difficulty walking or climbing stairs?: No Weakness of Legs: None Weakness of Arms/Hands: None  Home Assistive Devices/Equipment Home Assistive Devices/Equipment: None  Therapy Consults (therapy consults require a physician order) PT Evaluation Needed: No OT Evalulation Needed: No SLP Evaluation Needed: No Abuse/Neglect Assessment (Assessment to be complete while patient is alone) Abuse/Neglect Assessment Can Be Completed: Yes Physical Abuse: Denies Verbal Abuse: Denies Sexual Abuse: Denies Exploitation of patient/patient's resources: Denies Self-Neglect: Denies Values / Beliefs Cultural Requests During Hospitalization: None Spiritual Requests During Hospitalization: None Consults Spiritual Care Consult Needed: No Social Work Consult Needed: No Merchant navy officerAdvance Directives (For Healthcare) Does Patient Have a Medical Advance Directive?: No Would patient like information on creating a medical advance directive?: No - Patient declined    Additional Information 1:1 In Past 12 Months?: No CIRT Risk: No Elopement Risk: No Does patient have medical clearance?: Yes     Disposition:  Disposition Initial Assessment Completed for this Encounter: Yes Disposition of Patient: Referred to Patient referred to: Other (Comment)(Current outpatient provider -- see notes)  This service was provided via telemedicine using a 2-way, interactive audio and video technology.  Names of all persons participating in this telemedicine service and their role in this encounter. Name: Joycelyn ManMichael Sorbello Role: Patient             Earline Mayotteugene T Sharilyn Geisinger 11/04/2017 10:13 AM

## 2017-11-04 NOTE — ED Triage Notes (Signed)
Patient complains of left anterior CP intermittently x 1 month. States that he is suppose to take BP meds but hasn't taken since he ran out some time ago. Patient denies radiation, no SOB, alert and oriented
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
## 2017-12-29 ENCOUNTER — Encounter (HOSPITAL_COMMUNITY): Payer: Self-pay | Admitting: Emergency Medicine

## 2017-12-29 ENCOUNTER — Other Ambulatory Visit: Payer: Self-pay

## 2017-12-29 ENCOUNTER — Emergency Department (HOSPITAL_COMMUNITY)
Admission: EM | Admit: 2017-12-29 | Discharge: 2017-12-29 | Disposition: A | Discharge status: Home / Self Care | Payer: Medicaid Other | Attending: Emergency Medicine | Admitting: Emergency Medicine

## 2017-12-29 DIAGNOSIS — F250 Schizoaffective disorder, bipolar type: Secondary | ICD-10-CM | POA: Diagnosis not present

## 2017-12-29 DIAGNOSIS — Z79899 Other long term (current) drug therapy: Secondary | ICD-10-CM | POA: Insufficient documentation

## 2017-12-29 DIAGNOSIS — I10 Essential (primary) hypertension: Secondary | ICD-10-CM | POA: Diagnosis not present

## 2017-12-29 LAB — COMPREHENSIVE METABOLIC PANEL
ALBUMIN: 3.8 g/dL (ref 3.5–5.0)
ALK PHOS: 84 U/L (ref 38–126)
ALT: 27 U/L (ref 17–63)
AST: 29 U/L (ref 15–41)
Anion gap: 7 (ref 5–15)
BILIRUBIN TOTAL: 0.8 mg/dL (ref 0.3–1.2)
BUN: 10 mg/dL (ref 6–20)
CALCIUM: 8.9 mg/dL (ref 8.9–10.3)
CO2: 24 mmol/L (ref 22–32)
CREATININE: 1.13 mg/dL (ref 0.61–1.24)
Chloride: 105 mmol/L (ref 101–111)
GFR calc Af Amer: >60 mL/min (ref >60)
GFR calc non Af Amer: >60 mL/min (ref >60)
GLUCOSE: 94 mg/dL (ref 65–99)
Potassium: 4 mmol/L (ref 3.5–5.1)
SODIUM: 136 mmol/L (ref 135–145)
TOTAL PROTEIN: 7.5 g/dL (ref 6.5–8.1)

## 2017-12-29 LAB — RAPID URINE DRUG SCREEN, HOSP PERFORMED
Amphetamines: NOT DETECTED
Barbiturates: NOT DETECTED
Benzodiazepines: NOT DETECTED
COCAINE: NOT DETECTED
Opiates: NOT DETECTED
TETRAHYDROCANNABINOL: NOT DETECTED

## 2017-12-29 LAB — CBC
HCT: 38.6 % — ABNORMAL LOW (ref 39.0–52.0)
HEMOGLOBIN: 13.2 g/dL (ref 13.0–17.0)
MCH: 28.8 pg (ref 26.0–34.0)
MCHC: 34.2 g/dL (ref 30.0–36.0)
MCV: 84.3 fL (ref 78.0–100.0)
Platelets: 198 10*3/uL (ref 150–400)
RBC: 4.58 MIL/uL (ref 4.22–5.81)
RDW: 13.6 % (ref 11.5–15.5)
WBC: 5.4 10*3/uL (ref 4.0–10.5)

## 2017-12-29 LAB — ACETAMINOPHEN LEVEL: Acetaminophen (Tylenol), Serum: <10 ug/mL — ABNORMAL LOW (ref 10–30)

## 2017-12-29 LAB — SALICYLATE LEVEL: Salicylate Lvl: <7 mg/dL (ref 2.8–30.0)

## 2017-12-29 LAB — ETHANOL: Alcohol, Ethyl (B): <10 mg/dL (ref <10)

## 2017-12-29 NOTE — ED Notes (Signed)
Pt arrived to unit calm and cooperative.  Denies S/I and H/I.  Pt states he just needed to talk to someone and feels better.  Pt wants to be discharged.  15 minute checks and video monitoring in place.

## 2017-12-29 NOTE — ED Triage Notes (Signed)
Patient states he needs to be put away for good.  Has been dealing with depression/anxiety for years.

## 2017-12-29 NOTE — BH Assessment (Addendum)
Assessment Note  Allen Sosa is an 37 y.o. male that presents this date feeling "overwhelmed" due to a verbal altercation with his partner. Patient presents with a pleasant affect and reports that he is "very stressed" over recent relationship issues. Patient is currently receiving services from [facility] that assists with medication management for symptoms associated with depression. Patient is scheduled  to see that provider on 01/17/18 for continued services and medication management. Patient denies any S/I, H/I or AVH. Patient does report two previous attempts at self harm and was admitted inpatient in 2018 at [facility] for S/I. Patient denies any thoughts of self harm this date and wanted to "just get checked out." Patient denies any current legal or history of SA use. Patient presented as alert and oriented. Patient was dressed in scrubs and appeared appropriately groomed. Patient denied SI, HI, VH, self-injury, and substance use. Patient's  thought processes were logical and goal-oriented. There was no evidence of delusion. Romilda Garret FNP met with patient and noted that patient stated he took more Seroquel than he is prescribed and has been sleeping for the past two days. Patient stated he was not doing this in an attempt to harm himself, he just wanted to sleep. Patient denies suicidal/homicidal ideation, denies auditory and visual hallucinations and does not appear to be responding to internal stimuli. Patient is stable and psychiatrically clear for discharge. Patient has an appointment on 01-17-17 with his outpatient psychiatrist. Patient was encouraged to contact his provider on Monday 12-31-16 and make them aware he was at the emergency room in case any medication changes need to be made. Patient to be discharged this date.   Diagnosis: F33.2 MDD recurrent severe without psychotic features  Past Medical History:  Past Medical History:  Diagnosis Date  . Anxiety   . Depression   . Hyperlipemia   .  Hypertension   . Obesity   . Schizo affective schizophrenia ([place])   . Sleep apnea     Past Surgical History:  Procedure Laterality Date  . VASECTOMY      Family History:  Family History  Problem Relation Age of Onset  . Heart attack Mother   . Cancer Mother   . Cancer Sister     Social History:  reports that  has never smoked. he has never used smokeless tobacco. He reports that he does not drink alcohol or use drugs.  Additional Social History:  Alcohol / Drug Use Pain Medications: See MAR Prescriptions: See MAR Over the Counter: See MAR History of alcohol / drug use?: No history of alcohol / drug abuse Longest period of sobriety (when/how long): (NA) Negative Consequences of Use: (NA) Withdrawal Symptoms: (NA)  CIWA: CIWA-Ar BP: 131/81 Pulse Rate: 81 COWS:    Allergies: No Known Allergies  Home Medications:  (Not in a hospital admission)  OB/GYN Status:  No LMP for male patient.  General Assessment Data Location of Assessment: [facility] ED TTS Assessment: In system Is this a Tele or Face-to-Face Assessment?: Face-to-Face Is this an Initial Assessment or a Re-assessment for this encounter?: Initial Assessment Marital status: Married Carman name: (NA) Is patient pregnant?: No Pregnancy Status: No Living Arrangements: Spouse/significant other Can pt return to current living arrangement?: Yes Admission Status: Voluntary Is patient capable of signing voluntary admission?: Yes Referral Source: Self/Family/Friend Insurance type: Medicaid  Medical Screening Exam ([place]) Medical Exam completed: Yes  Crisis Care Plan Living Arrangements: Spouse/significant other Legal Guardian: (NA) Name of Psychiatrist: [facility] MD Name of Therapist:  None  Education Status Is patient currently in school?: No Current Grade: (NA) Highest grade of school patient has completed: (12) Name of school: (NA) Contact person: (NA)  Risk to self with the past 6  months Suicidal Ideation: No Has patient been a risk to self within the past 6 months prior to admission? : No Suicidal Intent: No Has patient had any suicidal intent within the past 6 months prior to admission? : No Is patient at risk for suicide?: Yes Suicidal Plan?: No Has patient had any suicidal plan within the past 6 months prior to admission? : No Access to Means: No What has been your use of drugs/alcohol within the last 12 months?: denies Previous Attempts/Gestures: Yes How many times?: 2 Other Self Harm Risks: NA Triggers for Past Attempts: Unknown Intentional Self Injurious Behavior: None Family Suicide History: No Recent stressful life event(s): Other (Comment)(Out of medications) Persecutory voices/beliefs?: No Depression: No Depression Symptoms: (NA) Substance abuse history and/or treatment for substance abuse?: No Suicide prevention information given to non-admitted patients: Not applicable  Risk to Others within the past 6 months Homicidal Ideation: No Does patient have any lifetime risk of violence toward others beyond the six months prior to admission? : No Thoughts of Harm to Others: No Current Homicidal Intent: No Current Homicidal Plan: No Access to Homicidal Means: No Identified Victim: NA History of harm to others?: No Assessment of Violence: None Noted Violent Behavior Description: (NA) Does patient have access to weapons?: No Criminal Charges Pending?: No Does patient have a court date: No Is patient on probation?: No  Psychosis Hallucinations: None noted Delusions: None noted  Mental Status Report Appearance/Hygiene: In scrubs Eye Contact: Good Motor Activity: Freedom of movement Speech: Logical/coherent Level of Consciousness: Alert Mood: Pleasant Affect: Appropriate to circumstance Anxiety Level: Minimal Thought Processes: Coherent, Relevant Judgement: Unimpaired Orientation: Person, Place, Time Obsessive Compulsive  Thoughts/Behaviors: None  Cognitive Functioning Concentration: Normal Memory: Recent Intact, Remote Intact IQ: Average Insight: Good Impulse Control: Good Appetite: Fair Weight Loss: 0 Weight Gain: 0 Sleep: No Change Total Hours of Sleep: 8 Vegetative Symptoms: None  ADLScreening [facility] Assessment Services) Patient's cognitive ability adequate to safely complete daily activities?: Yes Patient able to express need for assistance with ADLs?: Yes Independently performs ADLs?: Yes (appropriate for developmental age)  Prior Inpatient Therapy Prior Inpatient Therapy: Yes Prior Therapy Dates: 2018 Prior Therapy Facilty/Provider(s): [facility] Reason for Treatment: MH issues  Prior Outpatient Therapy Prior Outpatient Therapy: Yes Prior Therapy Dates: 2018 Prior Therapy Facilty/Provider(s): [facility] MD Reason for Treatment: MH issues Does patient have an ACCT team?: No Does patient have Intensive In-House Services?  : No Does patient have [facility] services? : No Does patient have P4CC services?: No  ADL Screening (condition at time of admission) Patient's cognitive ability adequate to safely complete daily activities?: Yes Is the patient deaf or have difficulty hearing?: No Does the patient have difficulty seeing, even when wearing glasses/contacts?: No Does the patient have difficulty concentrating, remembering, or making decisions?: No Patient able to express need for assistance with ADLs?: Yes Does the patient have difficulty dressing or bathing?: No Independently performs ADLs?: Yes (appropriate for developmental age) Does the patient have difficulty walking or climbing stairs?: No Weakness of Legs: None Weakness of Arms/Hands: None  Home Assistive Devices/Equipment Home Assistive Devices/Equipment: None  Therapy Consults (therapy consults require a physician order) PT Evaluation Needed: No OT Evalulation Needed: No SLP Evaluation Needed: No Abuse/Neglect Assessment (Assessment  to be complete while patient is alone) Physical  Abuse: Denies Verbal Abuse: Denies Sexual Abuse: Denies Exploitation of patient/patient's resources: Denies Self-Neglect: Denies Values / Beliefs Cultural Requests During Hospitalization: None Spiritual Requests During Hospitalization: None Consults Spiritual Care Consult Needed: No Social Work Consult Needed: No Regulatory affairs officer (For Healthcare) Does Patient Have a Medical Advance Directive?: No Would patient like information on creating a medical advance directive?: No - Patient declined    Additional Information 1:1 In Past 12 Months?: No CIRT Risk: No Elopement Risk: No Does patient have medical clearance?: Yes     Disposition: Patient is stable and psychiatrically clear for discharge. Patient has an appointment on 01-17-17 with his outpatient psychiatrist. Patient was encouraged to contact his provider on Monday 12-31-16 and make them aware he was at the emergency room in case any medication changes need to be made. Patient to be discharged this date.  Disposition Initial Assessment Completed for this Encounter: Yes Disposition of Patient: Other dispositions Other disposition(s): Other (Comment)(Pt to be discharged this date)  On Site Evaluation by:   Reviewed with Physician:    Mamie Nick 12/29/2017 3:37 PM

## 2017-12-29 NOTE — Progress Notes (Signed)
Patient ID: Alwyn RenMichael Haidar V., male   DOB: 1981-09-28, 37 y.o.   MRN: [id number]  Pt was seen by TTS and this writer and discussed with the treatment team. Pt stated he took more Seroquel than he is prescribed and has been sleeping for the past two days. Pt stated he was not doing this in an attempt to harm himself, he just wanted to sleep.  Pt denies suicidal/homicidal ideation, denies auditory and visual hallucinations and does not appear to be responding to internal stimuli. Pt is stable and psychiatrically clear for discharge. Pt has an appointment on 01-17-17 with his outpatient psychiatrist. Pt was encouraged to contact his provider on Monday 12-31-16 and make them aware he was at the emergency room in case any medication changes need to be made.  Orrin BrighamLaurie B Alaijah Gibler, FNP-C 12-29-2016     [phone]

## 2017-12-29 NOTE — Discharge Instructions (Signed)
Contact your outpatient psychiatrist on 12-31-16 to let them know you were in the emergency room  If your outpatient provider is unable to see you on 01-17-17, follow up at:   [facility] [address] [place], [place] [address] [phone]  Hour of operations: Monday-Friday, 8:30-5 p.m. Walk-in

## 2017-12-29 NOTE — ED Notes (Signed)
Pt discharged safely .  Pt was in no distress.  Denies S/I, H/I, and AVH.  All belongings were returned .

## 2017-12-29 NOTE — BH Assessment (Signed)
[facility] Assessment Progress Note  Patient is stable and psychiatrically clear for discharge. Patient has an appointment on 01-17-17 with his outpatient psychiatrist. Patient was encouraged to contact his provider on Monday 12-31-16 and make them aware he was at the emergency room in case any medication changes need to be made. Patient to be discharged this date.

## 2017-12-29 NOTE — ED Triage Notes (Signed)
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    Patient has thoughts of harming himself but no plan.  Denies any drug or alcohol use.

## 2017-12-29 NOTE — ED Provider Notes (Signed)
[facility]EMERGENCY DEPT Provider Note   CSN: [id number] Arrival date & time: 12/29/17  1234     History   Chief Complaint Chief Complaint  Patient presents with  . Suicidal    HPI Allen RenMichael Schinke V. is a 37 y.o. male.  HPI  Allen [facility] is a 37 year old male with a history of schizoaffective disorder, hypertension, hyperlipidemia, obesity, OSA who presents to the emergency department for evaluation of suicidal and homicidal ideation.  Patient states that he "needs to be locked up."  Reports that he is very stressed and has thoughts of hurting himself and hurting others.  He does not have any specific plans.  These ideas started today.  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  States that he had a suicide attempt at age 37 by slitting his wrists.  Denies recent drug use.  States that he sees psychiatrist Dolores FrameKenneth Headen who manages his medications.  States that he is currently taking Seroquel, Latuda, and Invega, Klonopin and lamotrigine.  Reports that one week ago his medications were changed, but he cannot state exactly what was changed.  Denies fevers, chills, headache, chest pain, abdominal pain, N/V, diarrhea, dysuria, numbness, weakness.   Past Medical History:  Diagnosis Date  . Anxiety   . Depression   . Hyperlipemia   . Hypertension   . Obesity   . Schizo affective schizophrenia (HCC)   . Sleep apnea     Patient Active Problem List   Diagnosis Date Noted  . Hypertension 01/09/2017  . Obesity 01/09/2017  . Schizoaffective disorder, bipolar type (HCC) 01/07/2017  . 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 Abdominal pain 11/20/2014  . Abdominal pain in male 11/18/2014    Past Surgical History:  Procedure  Laterality Date  . VASECTOMY         Home Medications    Prior to Admission medications   Medication Sig Start Date End Date Taking? Authorizing Provider  amLODipine (NORVASC) 10 MG tablet Take 1 tablet (10 mg total) daily by mouth. 11/04/17  Yes Plunkett, Alphonzo LemmingsWhitney, MD  clonazePAM (KLONOPIN) 2 MG tablet Take 1 tablet (2 mg total) 3 (three) times daily by mouth. 11/04/17  Yes Plunkett, Alphonzo LemmingsWhitney, MD  hydrALAZINE (APRESOLINE) 10 MG tablet Take 1 tablet (10 mg total) 3 (three) times daily by mouth. 11/04/17  Yes Plunkett, Alphonzo LemmingsWhitney, MD  hydrochlorothiazide (MICROZIDE) 12.5 MG capsule Take 1 capsule (12.5 mg total) daily by mouth. 11/04/17  Yes Plunkett, Alphonzo LemmingsWhitney, MD  lamoTRIgine (LAMICTAL) 200 MG tablet Take 200 mg 2 (two) times daily by mouth.   Yes [provider]  Lurasidone HCl 120 MG TABS Take 120 mg by mouth at bedtime.   Yes [provider]  Lurasidone HCl 120 MG TABS Take 120 mg by mouth daily.   Yes [provider]  Paliperidone Palmitate (INVEGA SUSTENNA IM) Inject 1 Syringe into the muscle every 3 (three) months.   Yes [provider]  QUEtiapine (SEROQUEL) 200 MG tablet Take 400 mg by mouth at bedtime.   Yes [provider]  paliperidone (INVEGA) 3 MG 24 hr tablet Take 1 tablet (3 mg total) by mouth at bedtime. Patient not taking: Reported on 03/22/2017 01/13/17   [name], [name], NP  pantoprazole (PROTONIX) 40 MG tablet Take 1 tablet (40 mg total) by mouth daily. Patient not taking: Reported on 03/22/2017 01/14/17   Adonis Brook, NP  traZODone (DESYREL) 100 MG tablet Take 1 tablet (100 mg total) by mouth at bedtime. Patient not taking: Reported on 03/22/2017 01/13/17   Adonis Brook, NP  venlafaxine XR (EFFEXOR-XR) 37.5 MG 24 hr capsule Take 1 capsule (37.5 mg total) by mouth daily with breakfast. Patient not taking: Reported on 03/22/2017 01/14/17   Adonis Brook, NP    Family History Family History  Problem Relation Age of Onset  .  Heart attack Mother   . Cancer Mother   . Cancer Sister     Social History Social History   Tobacco Use  . Smoking status: Never Smoker  . Smokeless tobacco: Never Used  Substance Use Topics  . Alcohol use: No    Alcohol/week: 0.0 oz  . Drug use: No     Allergies   Patient has no known allergies.   Review of Systems Review of Systems  Constitutional: Negative for chills, fatigue and fever.  Eyes: Negative for visual disturbance.  Respiratory: Negative for shortness of breath.   Cardiovascular: Negative for chest pain.  Gastrointestinal: Negative for abdominal pain, nausea and vomiting.  Genitourinary: Negative for difficulty urinating and dysuria.  Musculoskeletal: Negative for gait problem.  Skin: Negative for rash.  Neurological: Negative for dizziness, weakness, numbness and headaches.  Psychiatric/Behavioral: Positive for suicidal ideas.     Physical Exam Updated Vital Signs BP 131/81 (BP Location: Right Arm)   Pulse 81   Temp 99.4 F (37.4 C) (Oral)   Resp 16   Ht 6\' 3"  (1.905 m)   Wt (!) 170.1 kg (375 lb)   SpO2 97%   BMI 46.87 kg/m   Physical Exam  Constitutional: He is oriented to person, place, and time. He appears well-developed and well-nourished. No distress.  HENT:  Head: Normocephalic and atraumatic.  Mouth/Throat: Oropharynx is clear and moist. No oropharyngeal exudate.  Eyes: Conjunctivae and EOM are normal. Pupils are equal, round, and reactive to light. Right eye exhibits no discharge. Left eye exhibits no discharge.  Neck: Normal range of motion. Neck supple.  Cardiovascular: Normal rate, regular rhythm and intact distal pulses. Exam reveals no friction rub.  No murmur heard. Pulmonary/Chest: Effort normal and breath sounds normal. No stridor. No respiratory distress. He has no wheezes. He has no rales.  Abdominal: Soft. Bowel sounds are normal. There is no tenderness. There is no guarding.  Musculoskeletal: Normal range of motion.    Lymphadenopathy:    He has no cervical adenopathy.  Neurological: He is alert and oriented to person, place, and time. Coordination normal.  Mental Status:  Alert, oriented, thought content appropriate, able to give a coherent history. Speech fluent without evidence of aphasia. Able to follow 2 step commands without difficulty.  Cranial Nerves:  II:  Peripheral visual fields grossly normal, pupils equal, round, reactive to light III,IV, VI: ptosis not present, extra-ocular motions intact bilaterally  V,VII: smile symmetric, facial light touch sensation equal VIII: hearing grossly normal to voice  X: uvula elevates symmetrically  XI: bilateral shoulder shrug symmetric and strong XII: midline tongue extension without fassiculations Motor:  Normal tone. 5/5 in upper and lower extremities bilaterally including strong and equal grip strength and dorsiflexion/plantar flexion Sensory: Light touch normal in all extremities.  Deep Tendon Reflexes: 2+ and symmetric in patella Cerebellar: normal finger-to-nose with bilateral upper extremities Gait: normal gait and balance  Skin: Skin is warm and dry. Capillary refill takes less than 2 seconds. He is not diaphoretic.  Psychiatric: He has a normal mood and affect. His behavior is normal.  Appears unkempt. Flat affect. Voice with good articulation. Normal speed and volume. He endorses SI and HI. No specific plan. Endorses hearing voices that other people do not hear.   Nursing note and vitals reviewed.    ED Treatments / Results  Labs (all labs ordered are listed, but only abnormal results are displayed) Labs Reviewed  COMPREHENSIVE METABOLIC PANEL  ETHANOL  SALICYLATE LEVEL  ACETAMINOPHEN LEVEL  CBC  RAPID URINE DRUG SCREEN, HOSP PERFORMED    EKG  EKG Interpretation None       Radiology No results found.  Procedures Procedures (including critical care time)  Medications Ordered in ED Medications - No data to  display   Initial Impression / Assessment and Plan / ED Course  I have reviewed the triage vital signs and the nursing notes.  Pertinent labs & imaging results that were available during my care of the patient were reviewed by me and considered in my medical decision making (see chart for details).    Patient presents with suicidal and homicidal ideations. No specific plan. Basic labs reviewed. CBC and CMP unremarkable. Acetaminophen, salicylate and ethanol level negative. Rapid urine drug screen negative.   Patient is medically cleared. He has been placed in psych hold and awaiting TTS consult. Per Counselor Pollyann Glen patient is psychiatrically cleared for discharge and has f/u apt with his psychiatrist 01/17/18. Please see his note for more information.   Final Clinical Impressions(s) / ED Diagnoses   Final diagnoses:  Schizoaffective disorder, bipolar type Allen [facility])    ED Discharge Orders    None       Kellie Shropshire, PA-C 12/29/17 1603    Jacalyn Lefevre, MD 12/29/17 [phone]

## 2018-01-22 ENCOUNTER — Encounter (HOSPITAL_COMMUNITY): Payer: Self-pay | Admitting: *Deleted

## 2018-01-22 ENCOUNTER — Emergency Department (HOSPITAL_COMMUNITY): Payer: Medicaid Other

## 2018-01-22 ENCOUNTER — Other Ambulatory Visit: Payer: Self-pay

## 2018-01-22 ENCOUNTER — Emergency Department (EMERGENCY_DEPARTMENT_HOSPITAL)
Admission: EM | Admit: 2018-01-22 | Discharge: 2018-01-23 | Disposition: A | Discharge status: Home / Self Care | Payer: Medicaid Other | Source: Home / Self Care | Attending: Emergency Medicine | Admitting: Emergency Medicine

## 2018-01-22 ENCOUNTER — Emergency Department (HOSPITAL_COMMUNITY)
Admission: EM | Admit: 2018-01-22 | Discharge: 2018-01-22 | Disposition: A | Discharge status: Home / Self Care | Payer: Medicaid Other | Attending: Emergency Medicine | Admitting: Emergency Medicine

## 2018-01-22 ENCOUNTER — Encounter (HOSPITAL_COMMUNITY): Payer: Self-pay

## 2018-01-22 DIAGNOSIS — E785 Hyperlipidemia, unspecified: Secondary | ICD-10-CM | POA: Insufficient documentation

## 2018-01-22 DIAGNOSIS — F329 Major depressive disorder, single episode, unspecified: Secondary | ICD-10-CM | POA: Diagnosis not present

## 2018-01-22 DIAGNOSIS — R109 Unspecified abdominal pain: Secondary | ICD-10-CM | POA: Diagnosis not present

## 2018-01-22 DIAGNOSIS — Z79899 Other long term (current) drug therapy: Secondary | ICD-10-CM | POA: Diagnosis not present

## 2018-01-22 DIAGNOSIS — I10 Essential (primary) hypertension: Secondary | ICD-10-CM | POA: Diagnosis not present

## 2018-01-22 DIAGNOSIS — R45851 Suicidal ideations: Secondary | ICD-10-CM | POA: Insufficient documentation

## 2018-01-22 DIAGNOSIS — E119 Type 2 diabetes mellitus without complications: Secondary | ICD-10-CM | POA: Diagnosis not present

## 2018-01-22 DIAGNOSIS — F250 Schizoaffective disorder, bipolar type: Secondary | ICD-10-CM | POA: Diagnosis present

## 2018-01-22 DIAGNOSIS — R197 Diarrhea, unspecified: Secondary | ICD-10-CM | POA: Diagnosis not present

## 2018-01-22 LAB — CBC WITH DIFFERENTIAL/PLATELET
BAND NEUTROPHILS: 0 %
BASOS PCT: 0 %
Basophils Absolute: 0 10*3/uL (ref 0.0–0.1)
Blasts: 0 %
EOS ABS: 0 10*3/uL (ref 0.0–0.7)
Eosinophils Relative: 0 %
HCT: 40.5 % (ref 39.0–52.0)
HEMOGLOBIN: 14.4 g/dL (ref 13.0–17.0)
Lymphocytes Relative: 37 %
Lymphs Abs: 2.9 10*3/uL (ref 0.7–4.0)
MCH: 29.9 pg (ref 26.0–34.0)
MCHC: 35.6 g/dL (ref 30.0–36.0)
MCV: 84 fL (ref 78.0–100.0)
METAMYELOCYTES PCT: 0 %
MONO ABS: 1 10*3/uL (ref 0.1–1.0)
MYELOCYTES: 0 %
Monocytes Relative: 13 %
Neutro Abs: 4 10*3/uL (ref 1.7–7.7)
Neutrophils Relative %: 50 %
Other: 0 %
PROMYELOCYTES ABS: 0 %
Platelets: 214 10*3/uL (ref 150–400)
RBC: 4.82 MIL/uL (ref 4.22–5.81)
RDW: 13.6 % (ref 11.5–15.5)
WBC: 7.9 10*3/uL (ref 4.0–10.5)
nRBC: 0 /100 WBC

## 2018-01-22 LAB — COMPREHENSIVE METABOLIC PANEL
ALK PHOS: 106 U/L (ref 38–126)
ALT: 38 U/L (ref 17–63)
ANION GAP: 9 (ref 5–15)
AST: 45 U/L — AB (ref 15–41)
Albumin: 4.3 g/dL (ref 3.5–5.0)
BILIRUBIN TOTAL: 0.3 mg/dL (ref 0.3–1.2)
BUN: 6 mg/dL (ref 6–20)
CALCIUM: 9.3 mg/dL (ref 8.9–10.3)
CO2: 24 mmol/L (ref 22–32)
CREATININE: 1.28 mg/dL — AB (ref 0.61–1.24)
Chloride: 100 mmol/L — ABNORMAL LOW (ref 101–111)
GFR calc Af Amer: >60 mL/min (ref >60)
GFR calc non Af Amer: >60 mL/min (ref >60)
Glucose, Bld: 98 mg/dL (ref 65–99)
Potassium: 4.1 mmol/L (ref 3.5–5.1)
Sodium: 133 mmol/L — ABNORMAL LOW (ref 135–145)
TOTAL PROTEIN: 8.7 g/dL — AB (ref 6.5–8.1)

## 2018-01-22 LAB — LIPASE, BLOOD: Lipase: 42 U/L (ref 11–51)

## 2018-01-22 MED ORDER — AMLODIPINE BESYLATE 5 MG PO TABS
10.0000 mg | ORAL_TABLET | Freq: Every day | ORAL | Status: DC
  Administered 2018-01-23: 10 mg via ORAL
  Filled 2018-01-22 (×2): qty 2

## 2018-01-22 MED ORDER — IOPAMIDOL (ISOVUE-300) INJECTION 61%
INTRAVENOUS | Status: AC
  Filled 2018-01-22: qty 100

## 2018-01-22 MED ORDER — HYDROCHLOROTHIAZIDE 12.5 MG PO CAPS
12.5000 mg | ORAL_CAPSULE | Freq: Every day | ORAL | Status: DC
  Administered 2018-01-23: 12.5 mg via ORAL
  Filled 2018-01-22: qty 1

## 2018-01-22 MED ORDER — LAMOTRIGINE 100 MG PO TABS
200.0000 mg | ORAL_TABLET | Freq: Two times a day (BID) | ORAL | Status: DC
  Administered 2018-01-22 – 2018-01-23 (×2): 200 mg via ORAL
  Filled 2018-01-22 (×2): qty 2

## 2018-01-22 MED ORDER — HYDRALAZINE HCL 10 MG PO TABS
10.0000 mg | ORAL_TABLET | Freq: Three times a day (TID) | ORAL | Status: DC
  Administered 2018-01-23: 10 mg via ORAL
  Filled 2018-01-22 (×2): qty 1

## 2018-01-22 MED ORDER — SODIUM CHLORIDE 0.9 % IV BOLUS (SEPSIS)
1000.0000 mL | Freq: Once | INTRAVENOUS | Status: AC
  Administered 2018-01-22: 1000 mL via INTRAVENOUS

## 2018-01-22 MED ORDER — IOPAMIDOL (ISOVUE-300) INJECTION 61%
100.0000 mL | Freq: Once | INTRAVENOUS | Status: AC | PRN
  Administered 2018-01-22: 100 mL via INTRAVENOUS

## 2018-01-22 MED ORDER — CLONAZEPAM 1 MG PO TABS
2.0000 mg | ORAL_TABLET | Freq: Three times a day (TID) | ORAL | Status: DC | PRN
  Administered 2018-01-22: 2 mg via ORAL
  Filled 2018-01-22: qty 2

## 2018-01-22 NOTE — ED Notes (Signed)
Bed: [facility] Expected date:  Expected time:  Means of arrival:  Comments: 

## 2018-01-22 NOTE — ED Notes (Signed)
Pt reports current suicidal ideation and homicidal ideations with plans of cutting his head off and others heads off. Patient denies AVH. Plan of care discussed. Encouragement and support provided and safety maintain. Q 15 min safety checks in place and video monitoring.

## 2018-01-22 NOTE — ED Provider Notes (Signed)
[facility]-EMERGENCY DEPT Provider Note   CSN: [id number] Arrival date & time: 01/22/18  1304     History   Chief Complaint Chief Complaint  Patient presents with  . Diarrhea  . Encopresis  . Urinary Incontinence    HPI Allen [facility] V. is a 37 y.o. male.  Patient seen a signout from Dr. Clarene DukeMcManus.  37 year old male with a long-standing history of anxiety, depression, schizoaffective schizophrenia, and chronic diarrhea.  He presents with complaint of continued diarrhea and incontinence of stool.     The history is provided by the patient.  Diarrhea   This is a chronic problem. The current episode started more than 1 week ago. The problem has not changed since onset.There has been no fever. He has tried nothing for the symptoms. The treatment provided no relief. Risk factors: chronic condition.    Past Medical History:  Diagnosis Date  . Anxiety   . Depression   . Hyperlipemia   . Hypertension   . Obesity   . Schizo affective schizophrenia (HCC)   . Sleep apnea     Patient Active Problem List   Diagnosis Date Noted  . Hypertension 01/09/2017  . Obesity 01/09/2017  . Schizoaffective disorder, bipolar type (HCC) 01/07/2017  . 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 Abdominal pain 11/20/2014  . Abdominal pain in male 11/18/2014    Past Surgical History:  Procedure Laterality Date  . VASECTOMY         Home Medications    Prior to Admission medications   Medication Sig Start Date End Date Taking? Authorizing Provider  amLODipine (NORVASC) 10 MG tablet Take 1 tablet (10 mg total) daily by mouth. 11/04/17   [name], [name], MD  clonazePAM (KLONOPIN) 2 MG tablet Take 1 tablet (2 mg total) 3 (three) times daily by mouth. 11/04/17   [name], [name], MD  hydrALAZINE (APRESOLINE) 10 MG tablet Take 1 tablet (10 mg total) 3 (three) times daily by mouth. 11/04/17   [name], [name], MD    hydrochlorothiazide (MICROZIDE) 12.5 MG capsule Take 1 capsule (12.5 mg total) daily by mouth. 11/04/17   [name], [name], MD  lamoTRIgine (LAMICTAL) 200 MG tablet Take 200 mg 2 (two) times daily by mouth.    [provider]  Lurasidone HCl 120 MG TABS Take 120 mg by mouth at bedtime.    [provider]  Lurasidone HCl 120 MG TABS Take 120 mg by mouth daily.    [provider]  paliperidone (INVEGA) 3 MG 24 hr tablet Take 1 tablet (3 mg total) by mouth at bedtime. Patient not taking: Reported on 03/22/2017 01/13/17   [name], [name], NP  Paliperidone Palmitate (INVEGA SUSTENNA IM) Inject 1 Syringe into the muscle every 3 (three) months.    [provider]  pantoprazole (PROTONIX) 40 MG tablet Take 1 tablet (40 mg total) by mouth daily. Patient not taking: Reported on 03/22/2017 01/14/17   [name], [name], NP  QUEtiapine (SEROQUEL) 200 MG tablet Take 400 mg by mouth at bedtime.    [provider]  traZODone (DESYREL) 100 MG tablet Take 1 tablet (100 mg total) by mouth at bedtime. Patient not taking: Reported on 03/22/2017 01/13/17   [name], [name], NP  venlafaxine XR (EFFEXOR-XR) 37.5 MG 24 hr capsule Take 1 capsule (37.5 mg total) by mouth daily with breakfast. Patient not taking: Reported on 03/22/2017 01/14/17   [name], [name], NP  diphenhydrAMINE (BENADRYL) 25 MG tablet Take 2 tablets (50 mg total)  by mouth every 4 (four) hours as needed for itching. Patient not taking: Reported on 03/10/2015 12/04/14 03/10/15  Joycie Peek, PA-C    Family History Family History  Problem Relation Age of Onset  . Heart attack Mother   . Cancer Mother   . Cancer Sister     Social History Social History   Tobacco Use  . Smoking status: Never Smoker  . Smokeless tobacco: Never Used  Substance Use Topics  . Alcohol use: No    Alcohol/week: 0.0 oz  . Drug use: No     Allergies   Patient has no known allergies.   Review of Systems Review of Systems   Gastrointestinal: Positive for diarrhea.  All other systems reviewed and are negative.    Physical Exam Updated Vital Signs BP (!) 143/68   Pulse 88   Temp 98.1 F (36.7 C)   Resp 16   Ht 6\' 3"  (1.905 m)   Wt (!) 170.1 kg (375 lb)   SpO2 99%   BMI 46.87 kg/m   Physical Exam  Constitutional: He is oriented to person, place, and time. He appears well-developed and well-nourished. No distress.  HENT:  Head: Normocephalic and atraumatic.  Mouth/Throat: Oropharynx is clear and moist.  Eyes: Conjunctivae and EOM are normal. Pupils are equal, round, and reactive to light.  Neck: Normal range of motion. Neck supple.  Cardiovascular: Normal rate, regular rhythm and normal heart sounds.  Pulmonary/Chest: Effort normal and breath sounds normal. No respiratory distress.  Abdominal: Soft. He exhibits no distension. There is no tenderness.  Musculoskeletal: Normal range of motion. He exhibits no edema or deformity.  Neurological: He is alert and oriented to person, place, and time.  Skin: Skin is warm and dry.  Psychiatric: He has a normal mood and affect.  Nursing note and vitals reviewed.    ED Treatments / Results  Labs (all labs ordered are listed, but only abnormal results are displayed) Labs Reviewed  COMPREHENSIVE METABOLIC PANEL - Abnormal; Notable for the following components:      Result Value   Sodium 133 (*)    Chloride 100 (*)    Creatinine, Ser 1.28 (*)    Total Protein 8.7 (*)    AST 45 (*)    All other components within normal limits  C DIFFICILE QUICK SCREEN W PCR REFLEX  GASTROINTESTINAL PANEL BY PCR, STOOL (REPLACES STOOL CULTURE)  LIPASE, BLOOD  CBC WITH DIFFERENTIAL/PLATELET  URINALYSIS, ROUTINE W REFLEX MICROSCOPIC  POC OCCULT BLOOD, ED    EKG  EKG Interpretation None       Radiology Ct Abdomen Pelvis W Contrast  Result Date: 01/22/2018 CLINICAL DATA:  Generalized abdominal pain, diarrhea. EXAM: CT ABDOMEN AND PELVIS WITH CONTRAST  TECHNIQUE: Multidetector CT imaging of the abdomen and pelvis was performed using the standard protocol following bolus administration of intravenous contrast. CONTRAST:  100 mL of Isovue-300 intravenously. COMPARISON:  CT scan of November 18, 2014. FINDINGS: Lower chest: No acute abnormality. Hepatobiliary: No focal liver abnormality is seen. No gallstones, gallbladder wall thickening, or biliary dilatation. Pancreas: Unremarkable. No pancreatic ductal dilatation or surrounding inflammatory changes. Spleen: Normal in size without focal abnormality. Adrenals/Urinary Tract: Adrenal glands are unremarkable. Kidneys are normal, without renal calculi, focal lesion, or hydronephrosis. Bladder is unremarkable. Stomach/Bowel: Stomach is within normal limits. Appendix appears normal. No evidence of bowel wall thickening, distention, or inflammatory changes. Vascular/Lymphatic: No significant vascular findings are present. No enlarged abdominal or pelvic lymph nodes. Reproductive: Prostate is unremarkable. Other: No  abdominal wall hernia or abnormality. No abdominopelvic ascites. Musculoskeletal: No acute or significant osseous findings. IMPRESSION: No definite abnormality seen in the abdomen or pelvis. Electronically Signed   By: Lupita Raider, M.D.   On: 01/22/2018 15:46    Procedures Procedures (including critical care time)  Medications Ordered in ED Medications  iopamidol (ISOVUE-300) 61 % injection (not administered)  sodium chloride 0.9 % bolus 1,000 mL (0 mLs Intravenous Stopped 01/22/18 1626)  iopamidol (ISOVUE-300) 61 % injection 100 mL (100 mLs Intravenous Contrast Given 01/22/18 1525)     Initial Impression / Assessment and Plan / ED Course  I have reviewed the triage vital signs and the nursing notes.  Pertinent labs & imaging results that were available during my care of the patient were reviewed by me and considered in my medical decision making (see chart for details).     MDM   Screen  complete  Patient received as signout from prior provider.   Patient with primary complaint of chronic abdominal discomfort and diarrhea.  Screening exam in the ED does not reveal significant acute pathology.  CT does not reveal acute pathology.  Patient appears to be comfortable at time of my evaluation.  He desires discharge home.  He is aware of need for close follow-up.  Strict return precautions given and understood.  It is strongly suggested that patient follow-up with a GI physician for further evaluation.  Final Clinical Impressions(s) / ED Diagnoses   Final diagnoses:  Abdominal pain, unspecified abdominal location    ED Discharge Orders    None       Wynetta Fines, MD 01/22/18 [phone]

## 2018-01-22 NOTE — ED Provider Notes (Signed)
[facility]-EMERGENCY DEPT Provider Note   CSN: [id number] Arrival date & time: 01/22/18  1304     History   Chief Complaint Chief Complaint  Patient presents with  . Diarrhea  . Encopresis  . Urinary Incontinence    HPI Allen Sosa is a 37 y.o. male.  HPI  Pt was seen at 1325. Per pt, c/o gradual onset and persistence of multiple intermittent episodes of diarrhea for "years now." Has been associated with generalized abd "pain." States he has had GI issues since he was a teenager. Pt states the stools "just run out of me." States he cannot get to the bathroom in time. States he "used to see a GI doctor in [place]" for his symptoms, and was "rx a medicine for ulcers."  Denies N/V, no black or blood in stools, no CP/SOB, no fevers, no back pain, no focal motor weakness, no tingling/numbness in extremities, no saddle anesthesia.   Past Medical History:  Diagnosis Date  . Anxiety   . Depression   . Hyperlipemia   . Hypertension   . Obesity   . Schizo affective schizophrenia (HCC)   . Sleep apnea     Patient Active Problem List   Diagnosis Date Noted  . Hypertension 01/09/2017  . Obesity 01/09/2017  . Schizoaffective disorder, bipolar type (HCC) 01/07/2017  . 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 Abdominal pain 11/20/2014  . Abdominal pain in male 11/18/2014    Past Surgical History:  Procedure Laterality Date  . VASECTOMY         Home Medications    Prior to Admission medications   Medication Sig Start Date End Date Taking? Authorizing Provider  amLODipine (NORVASC) 10 MG tablet Take 1 tablet (10 mg total) daily by mouth. 11/04/17   Gwyneth Sprout, MD  clonazePAM (KLONOPIN) 2 MG tablet Take 1 tablet (2 mg total) 3 (three) times daily by mouth. 11/04/17   Gwyneth Sprout, MD  hydrALAZINE (APRESOLINE) 10 MG tablet Take 1 tablet (10 mg total) 3 (three) times daily by mouth. 11/04/17   Gwyneth Sprout, MD  hydrochlorothiazide (MICROZIDE) 12.5 MG capsule Take 1 capsule (12.5 mg total) daily by mouth. 11/04/17   Gwyneth Sprout, MD  lamoTRIgine (LAMICTAL) 200 MG tablet Take 200 mg 2 (two) times daily by mouth.    [provider]  Lurasidone HCl 120 MG TABS Take 120 mg by mouth at bedtime.    [provider]  Lurasidone HCl 120 MG TABS Take 120 mg by mouth daily.    [provider]  paliperidone (INVEGA) 3 MG 24 hr tablet Take 1 tablet (3 mg total) by mouth at bedtime. Patient not taking: Reported on 03/22/2017 01/13/17   Adonis Brook, NP  Paliperidone Palmitate (INVEGA SUSTENNA IM) Inject 1 Syringe into the muscle every 3 (three) months.    [provider]  pantoprazole (PROTONIX) 40 MG tablet Take 1 tablet (40 mg total) by mouth daily. Patient not taking: Reported on 03/22/2017 01/14/17   Adonis Brook, NP  QUEtiapine (SEROQUEL) 200 MG tablet Take 400 mg by mouth at bedtime.    [provider]  traZODone (DESYREL) 100 MG tablet Take 1 tablet (100 mg total) by mouth at bedtime. Patient not taking: Reported on 03/22/2017 01/13/17   Adonis Brook, NP  venlafaxine XR (EFFEXOR-XR) 37.5 MG 24 hr capsule Take 1 capsule (37.5 mg total) by mouth daily with breakfast. Patient not taking: Reported on 03/22/2017 01/14/17   Dominga Ferry,  Velna HatchetSheila, NP  diphenhydrAMINE (BENADRYL) 25 MG tablet Take 2 tablets (50 mg total) by mouth every 4 (four) hours as needed for itching. Patient not taking: Reported on 03/10/2015 12/04/14 03/10/15  Joycie Peekartner, Benjamin, PA-C    Family History Family History  Problem Relation Age of Onset  . Heart attack Mother   . Cancer Mother   . Cancer Sister     Social History Social History   Tobacco Use  . Smoking status: Never Smoker  . Smokeless tobacco: Never Used  Substance Use Topics  . Alcohol use: No    Alcohol/week: 0.0 oz  . Drug use: No     Allergies   Patient has no known allergies.   Review of  Systems Review of Systems ROS: Statement: All systems negative except as marked or noted in the HPI; Constitutional: Negative for fever and chills. ; ; Eyes: Negative for eye pain, redness and discharge. ; ; ENMT: Negative for ear pain, hoarseness, nasal congestion, sinus pressure and sore throat. ; ; Cardiovascular: Negative for chest pain, palpitations, diaphoresis, dyspnea and peripheral edema. ; ; Respiratory: Negative for cough, wheezing and stridor. ; ; Gastrointestinal: +abd pain, diarrhea. Negative for nausea, vomiting, blood in stool, hematemesis, jaundice and rectal bleeding. . ; ; Genitourinary: Negative for dysuria, flank pain and hematuria. ; ; Musculoskeletal: Negative for back pain and neck pain. Negative for swelling and trauma.; ; Skin: Negative for pruritus, rash, abrasions, blisters, bruising and skin lesion.; ; Neuro: Negative for headache, lightheadedness and neck stiffness. Negative for weakness, altered level of consciousness, altered mental status, extremity weakness, paresthesias, involuntary movement, seizure and syncope.       Physical Exam Updated Vital Signs BP 138/62   Pulse 94   Resp 18   Ht 6\' 3"  (1.905 m)   Wt (!) 170.1 kg (375 lb)   SpO2 98%   BMI 46.87 kg/m   Physical Exam 1330: Physical examination:  Nursing notes reviewed; Vital signs and O2 SAT reviewed;  Constitutional: Well developed, Well nourished, Well hydrated, In no acute distress; Head:  Normocephalic, atraumatic; Eyes: EOMI, PERRL, No scleral icterus; ENMT: Mouth and pharynx normal, Mucous membranes moist; Neck: Supple, Full range of motion, No lymphadenopathy; Cardiovascular: Regular rate and rhythm, No gallop; Respiratory: Breath sounds clear & equal bilaterally, No wheezes.  Speaking full sentences with ease, Normal respiratory effort/excursion; Chest: Nontender, Movement normal; Abdomen: Soft, +generalized tenderness to palp. No rebound or guarding. Nondistended, Normal bowel sounds;  Genitourinary: No CVA tenderness; Extremities: Pulses normal, No tenderness, No edema, No calf edema or asymmetry.; Neuro: AA&Ox3, Major CN grossly intact.  Speech clear. No gross focal motor or sensory deficits in extremities.; Skin: Color normal, Warm, Dry.   ED Treatments / Results  Labs (all labs ordered are listed, but only abnormal results are displayed)   EKG  EKG Interpretation None       Radiology   Procedures Procedures (including critical care time)  Medications Ordered in ED Medications  sodium chloride 0.9 % bolus 1,000 mL (not administered)     Initial Impression / Assessment and Plan / ED Course  I have reviewed the triage vital signs and the nursing notes.  Pertinent labs & imaging results that were available during my care of the patient were reviewed by me and considered in my medical decision making (see chart for details).  MDM Reviewed: nursing note, previous chart and vitals Reviewed previous: labs Interpretation: labs    Results for orders placed or performed during the hospital encounter  of 01/22/18  Comprehensive metabolic panel  Result Value Ref Range   Sodium 133 (L) 135 - 145 mmol/L   Potassium 4.1 3.5 - 5.1 mmol/L   Chloride 100 (L) 101 - 111 mmol/L   CO2 24 22 - 32 mmol/L   Glucose, Bld 98 65 - 99 mg/dL   BUN 6 6 - 20 mg/dL   Creatinine, Ser 1.61 (H) 0.61 - 1.24 mg/dL   Calcium 9.3 8.9 - 09.6 mg/dL   Total Protein 8.7 (H) 6.5 - 8.1 g/dL   Albumin 4.3 3.5 - 5.0 g/dL   AST 45 (H) 15 - 41 U/L   ALT 38 17 - 63 U/L   Alkaline Phosphatase 106 38 - 126 U/L   Total Bilirubin 0.3 0.3 - 1.2 mg/dL   GFR calc non Af Amer >60 >60 mL/min   GFR calc Af Amer >60 >60 mL/min   Anion gap 9 5 - 15  Lipase, blood  Result Value Ref Range   Lipase 42 11 - 51 U/L  CBC with Differential  Result Value Ref Range   WBC 7.9 4.0 - 10.5 K/uL   RBC 4.82 4.22 - 5.81 MIL/uL   Hemoglobin 14.4 13.0 - 17.0 g/dL   HCT 04.5 40.9 - 81.1 %   MCV 84.0 78.0 -  100.0 fL   MCH 29.9 26.0 - 34.0 pg   MCHC 35.6 30.0 - 36.0 g/dL   RDW 91.4 78.2 - 95.6 %   Platelets 214 150 - 400 K/uL   Neutrophils Relative % 50 %   Lymphocytes Relative 37 %   Monocytes Relative 13 %   Eosinophils Relative 0 %   Basophils Relative 0 %   Band Neutrophils 0 %   Metamyelocytes Relative 0 %   Myelocytes 0 %   Promyelocytes Absolute 0 %   Blasts 0 %   nRBC 0 0 /100 WBC   Other 0 %   Neutro Abs 4.0 1.7 - 7.7 K/uL   Lymphs Abs 2.9 0.7 - 4.0 K/uL   Monocytes Absolute 1.0 0.1 - 1.0 K/uL   Eosinophils Absolute 0.0 0.0 - 0.7 K/uL   Basophils Absolute 0.0 0.0 - 0.1 K/uL   Smear Review MORPHOLOGY UNREMARKABLE     1550:  Pt has ambulated to the bathroom multiple times while in the ED. No incont urine/stool while in the ED. UA and CT A/P pending. Sign out to Dr. Rodena Medin.    Final Clinical Impressions(s) / ED Diagnoses   Final diagnoses:  None    ED Discharge Orders    None       Samuel Jester, DO 01/22/18 1549

## 2018-01-22 NOTE — ED Provider Notes (Addendum)
[facility]-EMERGENCY DEPT Provider Note   CSN: [id number] Arrival date & time: 01/22/18  1811     History   Chief Complaint Chief Complaint  Patient presents with  . Suicidal  . Homicidal    HPI Allen Sosa is a 37 y.o. male.  37 year old male with prior history of hypertension, schizoaffective disorder, obesity, and hyperlipidemia presents with complaint of suicidal ideation.  Of note, patient was just seen at this facility for an unrelated complaint.  The same provider reevaluate him now.  Apparently, he left the facility and shortly thereafter decided that he was a threat to himself.  He is contemplating suicide.  He is afraid that he might do something to harm himself.  The patient did not report this suicidality earlier today when he was being evaluated for a GI complaint.  Patient reports a long-standing history of intermittent suicidality.  Patient reports a prior history of inpatient psychiatric treatment.  His last episode of inpatient psychiatric treatment was approximately 1 year prior.  He reports prior attempts at suicide in the past.   The history is provided by the patient.  Mental Health Problem  Presenting symptoms: suicidal thoughts   Degree of incapacity (severity):  Mild Onset quality:  Sudden Duration:  1 hour Timing:  Constant Progression:  Worsening Chronicity:  New Treatment compliance:  Some of the time Relieved by:  Nothing Worsened by:  Nothing Ineffective treatments:  None tried   Past Medical History:  Diagnosis Date  . Anxiety   . Depression   . Hyperlipemia   . Hypertension   . Obesity   . Schizo affective schizophrenia (HCC)   . Sleep apnea     Patient Active Problem List   Diagnosis Date Noted  . Hypertension 01/09/2017  . Obesity 01/09/2017  . Schizoaffective disorder, bipolar type (HCC) 01/07/2017  . 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 Abdominal pain  11/20/2014  . Abdominal pain in male 11/18/2014    Past Surgical History:  Procedure Laterality Date  . VASECTOMY         Home Medications    Prior to Admission medications   Medication Sig Start Date End Date Taking? Authorizing Provider  amLODipine (NORVASC) 10 MG tablet Take 1 tablet (10 mg total) daily by mouth. 11/04/17  Yes Plunkett, [name], MD  clonazePAM (KLONOPIN) 2 MG tablet Take 1 tablet (2 mg total) 3 (three) times daily by mouth. Patient taking differently: Take 2 mg by mouth 3 (three) times daily as needed for anxiety.  11/04/17  Yes Gwyneth Sprout, MD  hydrALAZINE (APRESOLINE) 10 MG tablet Take 1 tablet (10 mg total) 3 (three) times daily by mouth. 11/04/17  Yes Plunkett, Alphonzo Lemmings, MD  hydrochlorothiazide (MICROZIDE) 12.5 MG capsule Take 1 capsule (12.5 mg total) daily by mouth. 11/04/17  Yes Plunkett, Alphonzo Lemmings, MD  lamoTRIgine (LAMICTAL) 200 MG tablet Take 200 mg 2 (two) times daily by mouth.   Yes [provider]  Multiple Vitamins-Minerals (MULTIVITAMIN ADULTS) TABS Take 1 tablet by mouth daily.   Yes [provider]  LATUDA 60 MG TABS Take 60 mg by mouth at bedtime. 01/19/18   [provider]  Lurasidone HCl 120 MG TABS Take 120 mg by mouth at bedtime.    [provider]  paliperidone (INVEGA) 3 MG 24 hr tablet Take 1 tablet (3 mg total) by mouth at bedtime. Patient not taking: Reported on 01/22/2018 01/13/17   Adonis Brook, NP  Paliperidone Palmitate (INVEGA  SUSTENNA IM) Inject 1 Syringe into the muscle every 3 (three) months.    [provider]  pantoprazole (PROTONIX) 40 MG tablet Take 1 tablet (40 mg total) by mouth daily. Patient not taking: Reported on 03/22/2017 01/14/17   [name], [name], NP  QUEtiapine (SEROQUEL) 200 MG tablet Take 400 mg by mouth at bedtime.    [provider]  traZODone (DESYREL) 100 MG tablet Take 1 tablet (100 mg total) by mouth at bedtime. Patient not taking: Reported on 03/22/2017  01/13/17   [name], [name], NP  venlafaxine XR (EFFEXOR-XR) 37.5 MG 24 hr capsule Take 1 capsule (37.5 mg total) by mouth daily with breakfast. Patient not taking: Reported on 03/22/2017 01/14/17   [name], [name], NP    Family History Family History  Problem Relation Age of Onset  . Heart attack Mother   . Cancer Mother   . Cancer Sister     Social History Social History   Tobacco Use  . Smoking status: Never Smoker  . Smokeless tobacco: Never Used  Substance Use Topics  . Alcohol use: No    Alcohol/week: 0.0 oz  . Drug use: No     Allergies   Patient has no known allergies.   Review of Systems Review of Systems  Psychiatric/Behavioral: Positive for suicidal ideas.  All other systems reviewed and are negative.    Physical Exam Updated Vital Signs BP 137/90 (BP Location: Left Arm)   Pulse 85   Temp 98.5 F (36.9 C) (Oral)   Resp 14   SpO2 96%   Physical Exam  Constitutional: He is oriented to person, place, and time. He appears well-developed and well-nourished. No distress.  HENT:  Head: Normocephalic and atraumatic.  Mouth/Throat: Oropharynx is clear and moist.  Eyes: Conjunctivae and EOM are normal. Pupils are equal, round, and reactive to light.  Neck: Normal range of motion. Neck supple.  Cardiovascular: Normal rate, regular rhythm and normal heart sounds.  Pulmonary/Chest: Effort normal and breath sounds normal. No respiratory distress.  Abdominal: Soft. He exhibits no distension. There is no tenderness.  Musculoskeletal: Normal range of motion. He exhibits no edema or deformity.  Neurological: He is alert and oriented to person, place, and time.  Skin: Skin is warm and dry.  Psychiatric: He has a normal mood and affect.  Patient expresses suicidality.  He lacks at this time a specific plan to harm himself.  He reports prior inpatient psychiatric treatment.  He reports prior suicide attempts.   Nursing note and vitals reviewed.    ED Treatments  / Results  Labs (all labs ordered are listed, but only abnormal results are displayed) Labs Reviewed - No data to display  EKG  EKG Interpretation None       Radiology Ct Abdomen Pelvis W Contrast  Result Date: 01/22/2018 CLINICAL DATA:  Generalized abdominal pain, diarrhea. EXAM: CT ABDOMEN AND PELVIS WITH CONTRAST TECHNIQUE: Multidetector CT imaging of the abdomen and pelvis was performed using the standard protocol following bolus administration of intravenous contrast. CONTRAST:  100 mL of Isovue-300 intravenously. COMPARISON:  CT scan of November 18, 2014. FINDINGS: Lower chest: No acute abnormality. Hepatobiliary: No focal liver abnormality is seen. No gallstones, gallbladder wall thickening, or biliary dilatation. Pancreas: Unremarkable. No pancreatic ductal dilatation or surrounding inflammatory changes. Spleen: Normal in size without focal abnormality. Adrenals/Urinary Tract: Adrenal glands are unremarkable. Kidneys are normal, without renal calculi, focal lesion, or hydronephrosis. Bladder is unremarkable. Stomach/Bowel: Stomach is within normal limits. Appendix appears normal. No evidence of  bowel wall thickening, distention, or inflammatory changes. Vascular/Lymphatic: No significant vascular findings are present. No enlarged abdominal or pelvic lymph nodes. Reproductive: Prostate is unremarkable. Other: No abdominal wall hernia or abnormality. No abdominopelvic ascites. Musculoskeletal: No acute or significant osseous findings. IMPRESSION: No definite abnormality seen in the abdomen or pelvis. Electronically Signed   By: Lupita Raider, M.D.   On: 01/22/2018 15:46    Procedures Procedures (including critical care time)  Medications Ordered in ED Medications - No data to display   Initial Impression / Assessment and Plan / ED Course  I have reviewed the triage vital signs and the nursing notes.  Pertinent labs & imaging results that were available during my care of the  patient were reviewed by me and considered in my medical decision making (see chart for details).     MDM  Screen complete  Patient is presenting with reported suicidal ideation.  Of note, patient was seen earlier today for an unrelated complaint (by the same provider).  His screening evaluation at that time did not show any acute pathology.  Patient is medically clear at this time for further psychiatric evaluation and treatment.  Patient seen by TTS and they recommend inpatient care.    Pending disposition will depend on psychiatric evaluation and treatment plan.   Final Clinical Impressions(s) / ED Diagnoses   Final diagnoses:  Suicidal ideations    ED Discharge Orders    None       Wynetta Fines, MD 01/22/18 2159    Wynetta Fines, MD 01/22/18 [phone]

## 2018-01-22 NOTE — BH Assessment (Addendum)
Assessment Note  Allen Sosa is an 37 y.o. male who presents voluntarily to the [facility] alone reporting symptoms of depression and suicidal ideation. Pt has a history of depression, anxiety and schizoaffective disorder. Pt reports being compliant on his medications until today.  Pt reports current suicidal ideation with plans of cutting his head off.  Past attempts include cutting his wrists twice in the past. Pt acknowledges symptoms including: fatigue, guilt, low self esteem, tearfulness, isolating, lack of motivation, irritability, difficulty concentrating, sleeping more an dless, eating more and less.  Pt also reports having panic attacks daily, intrusive thoughts, excessive worry and restlessness.  Pt reports homicidal ideation with a plan to harm anyone by cutting their head off or breaking their neck.  Pt denies history of violence. Pt reports auditory hallucinations or other psychotic symptoms. Pt states current stressors include his wife putting a lot of pressure on him and his job.   Pt lives with wife and stepson, and supports include his wife and mother. Pt denies history of abuse and trauma. Pt reports there is a family history of MH/SA. 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 Pt has poor insight and impaired judgment. Pt's memory is intact.  Pt denies any legal history. Pt's OP history includes therapy and medication management at [facility]. IP history includes [facility]. Last admission was in 2018. Pt denies alcohol/ substance abuse.  Pt is dressed in scrubs, alert, oriented x4 with normal speech and normal motor behavior. Eye contact is good. Pt's mood is depressed, apprehensive and suspicious and affect is congruent with mood. Thought process is coherent and relevant. There is no indication Pt is currently responding to internal stimuli or experiencing delusional thought content. Pt was cooperative throughout assessment. Pt is currently unable to contract for  safety outside the hospital and wants inpatient psychiatric treatment.    Diagnosis: F33.3 Major depressive disorder, Recurrent episode, With psychotic features  Past Medical History:  Past Medical History:  Diagnosis Date  . Anxiety   . Depression   . Hyperlipemia   . Hypertension   . Obesity   . Schizo affective schizophrenia (HCC)   . Sleep apnea     Past Surgical History:  Procedure Laterality Date  . VASECTOMY      Family History:  Family History  Problem Relation Age of Onset  . Heart attack Mother   . Cancer Mother   . Cancer Sister     Social History:  reports that  has never smoked. he has never used smokeless tobacco. He reports that he does not drink alcohol or use drugs.  Additional Social History:  Alcohol / Drug Use Pain Medications: See MAR Prescriptions: See MAR Over the Counter: See MAR History of alcohol / drug use?: No history of alcohol / drug abuse  CIWA: CIWA-Ar BP: 137/90 Pulse Rate: 85 COWS:    Allergies: No Known Allergies  Home Medications:  (Not in a hospital admission)  OB/GYN Status:  No LMP for male patient.  General Assessment Data Location of Assessment: [facility] TTS Assessment: In system Is this a Tele or Face-to-Face Assessment?: Face-to-Face Is this an Initial Assessment or a Re-assessment for this encounter?: Initial Assessment Marital status: Married [place] name: N/A Is patient pregnant?: No Pregnancy Status: No Living Arrangements: Spouse/significant other Can pt return to current living arrangement?: No Admission Status: Voluntary Is patient capable of signing voluntary admission?: Yes Referral Source: Self/Family/Friend Insurance type: Medicaid     Crisis Care  Plan Living Arrangements: Spouse/significant other  Education Status Is patient currently in school?: No Highest grade of school patient has completed: 12th grade  Risk to self with the past 6 months Suicidal Ideation: Yes-Currently Present Has  patient been a risk to self within the past 6 months prior to admission? : Yes Suicidal Intent: Yes-Currently Present Has patient had any suicidal intent within the past 6 months prior to admission? : Yes Is patient at risk for suicide?: Yes Suicidal Plan?: Yes-Currently Present Has patient had any suicidal plan within the past 6 months prior to admission? : Yes Specify Current Suicidal Plan: Pt states he will cut his head off Access to Means: Yes Specify Access to Suicidal Means: Pt has access and ability What has been your use of drugs/alcohol within the last 12 months?: Pt denies Previous Attempts/Gestures: Yes How many times?: 2 Other Self Harm Risks: Pt denies Triggers for Past Attempts: Unknown Intentional Self Injurious Behavior: None Family Suicide History: No Recent stressful life event(s): Conflict (Comment), Turmoil (Comment)(Pt states his wife is putting a lot of pressure on him) Persecutory voices/beliefs?: Yes Depression: Yes Depression Symptoms: Fatigue, Tearfulness, Insomnia, Isolating, Guilt, Feeling worthless/self pity, Feeling angry/irritable Substance abuse history and/or treatment for substance abuse?: No Suicide prevention information given to non-admitted patients: Not applicable  Risk to Others within the past 6 months Homicidal Ideation: Yes-Currently Present Does patient have any lifetime risk of violence toward others beyond the six months prior to admission? : Yes (comment)(Pt states he is currently having thoughts of harming others) Thoughts of Harm to Others: Yes-Currently Present Comment - Thoughts of Harm to Others: Pt states he is having thoughts of harming others Current Homicidal Intent: Yes-Currently Present Current Homicidal Plan: Yes-Currently Present Describe Current Homicidal Plan: Pt states cut their head off or break their neck Access to Homicidal Means: Yes Describe Access to Homicidal Means: Pt has the ability Identified Victim: Pt states  anybody History of harm to others?: No Assessment of Violence: None Noted Violent Behavior Description: Pt denies Does patient have access to weapons?: No Criminal Charges Pending?: No Does patient have a court date: No Is patient on probation?: No  Psychosis Hallucinations: Auditory Delusions: None noted  Mental Status Report Appearance/Hygiene: In scrubs Eye Contact: Good Motor Activity: Freedom of movement Speech: Logical/coherent Level of Consciousness: Alert Mood: Depressed, Suspicious, Apprehensive Affect: Depressed, Apprehensive Anxiety Level: None Thought Processes: Coherent, Relevant Judgement: Impaired Orientation: Person, Place, Time, Situation, Appropriate for developmental age Obsessive Compulsive Thoughts/Behaviors: None  Cognitive Functioning Concentration: Normal Memory: Recent Intact, Remote Intact IQ: Average Insight: Poor Impulse Control: Poor Appetite: Fair Weight Loss: 0 Weight Gain: 0 Sleep: Decreased(Pt states he either sleeps too much or too little) Total Hours of Sleep: 5 Vegetative Symptoms: Staying in bed, Decreased grooming  ADLScreening [facility] Assessment Services) Patient's cognitive ability adequate to safely complete daily activities?: Yes Patient able to express need for assistance with ADLs?: Yes Independently performs ADLs?: Yes (appropriate for developmental age)  Prior Inpatient Therapy Prior Inpatient Therapy: Yes Prior Therapy Dates: 2018 [facility] Reason for Treatment: MH issues  Prior Outpatient Therapy Prior Outpatient Therapy: Yes Prior Therapy Dates: 2018 [facility] Reason for Treatment: MH issues Does patient have an ACCT team?: No Does patient have Intensive In-House Services?  : No Does patient have [facility] services? : No Does patient have P4CC services?: No  ADL Screening (condition at time of admission) Patient's cognitive ability adequate  to safely complete daily activities?: Yes  Is the patient deaf or have difficulty hearing?: No Does the patient have difficulty seeing, even when wearing glasses/contacts?: No Does the patient have difficulty concentrating, remembering, or making decisions?: No Patient able to express need for assistance with ADLs?: Yes Does the patient have difficulty dressing or bathing?: No Independently performs ADLs?: Yes (appropriate for developmental age) Does the patient have difficulty walking or climbing stairs?: No Weakness of Legs: None Weakness of Arms/Hands: None  Home Assistive Devices/Equipment Home Assistive Devices/Equipment: None    Abuse/Neglect Assessment (Assessment to be complete while patient is alone) Abuse/Neglect Assessment Can Be Completed: Yes Physical Abuse: Denies Verbal Abuse: Denies Sexual Abuse: Denies Exploitation of patient/patient's resources: Denies Self-Neglect: Denies     Merchant navy officer (For Healthcare) Does Patient Have a Medical Advance Directive?: No Would patient like information on creating a medical advance directive?: No - Patient declined    Additional Information 1:1 In Past 12 Months?: No CIRT Risk: No Elopement Risk: No Does patient have medical clearance?: Yes     Disposition: Gave clinical report to Donell Sievert, PA who stated Pt meets criteria for impatient psychiatric treatment.  Fransico Kaemon, RN and [facility] at [facility] stated there are no appropriate beds for the Pt.  SW to seek placement in the morning.  Notified Rochelle, RN and Dr. Rodena Medin of recommendation.  Disposition Initial Assessment Completed for this Encounter: Yes Disposition of Patient: Inpatient treatment program Type of inpatient treatment program: Adult  On Site Evaluation by:   Reviewed with Physician:    Annamaria Boots, MS, [facility] Therapeutic Triage Specialist  Annamaria Boots 01/22/2018 10:24 PM

## 2018-01-22 NOTE — ED Triage Notes (Signed)
EMS reports called for diarrhea, incontinence and abdominal pain. 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 Having incontinence x 1 12/2136/62. Hx or gastric ulcers. Sleep apnea and anxiety.  BP 158/123 HR 97 Resp 18 sp02 120

## 2018-01-22 NOTE — ED Triage Notes (Signed)
Pt states he is thinking about killing himself and other people. Pt states he is feeling paranoid.

## 2018-01-23 ENCOUNTER — Other Ambulatory Visit: Payer: Self-pay

## 2018-01-23 DIAGNOSIS — E119 Type 2 diabetes mellitus without complications: Secondary | ICD-10-CM

## 2018-01-23 DIAGNOSIS — F250 Schizoaffective disorder, bipolar type: Secondary | ICD-10-CM | POA: Diagnosis not present

## 2018-01-23 DIAGNOSIS — R197 Diarrhea, unspecified: Secondary | ICD-10-CM | POA: Diagnosis not present

## 2018-01-23 DIAGNOSIS — R45851 Suicidal ideations: Secondary | ICD-10-CM

## 2018-01-23 LAB — SALICYLATE LEVEL: Salicylate Lvl: <7 mg/dL (ref 2.8–30.0)

## 2018-01-23 LAB — ACETAMINOPHEN LEVEL

## 2018-01-23 LAB — ETHANOL: Alcohol, Ethyl (B): <10 mg/dL (ref <10)

## 2018-01-23 MED ORDER — CLONAZEPAM 1 MG PO TABS: 2.0000 mg | ORAL_TABLET | Freq: Every day | ORAL | Status: DC | PRN

## 2018-01-23 NOTE — ED Notes (Addendum)
Written dc instructions reviewed with pt.  Pt encouraged to take medications as directed and follow up with his regular dr. Work note given. Pt verbalized understanding.  Chaplin able to provide shirt for pt to change into.  Pt provided with scrubs, socks and slippers to change into.  Pt unable to wear slippers (too small). Belongings returned after leaving the area, Pt ambulatory w/o difficulty to dc area after changing.  Per pt wife is going to contact [employer].

## 2018-01-23 NOTE — [facility] Suicide Risk Assessment (Signed)
Suicide Risk Assessment  Discharge Assessment   [facility]   Principal Problem: Schizoaffective disorder, bipolar type [facility](HCC) Discharge Diagnoses:  Patient Active Problem List   Diagnosis Date Noted  . Hypertension [I10] 01/09/2017  . Obesity [E66.9] 01/09/2017  . Schizoaffective disorder, bipolar type (HCC) [F25.0] 01/07/2017  . 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 Abdominal pain [R10.9] 11/20/2014  . Abdominal pain in male [R10.9] 11/18/2014    Total Time spent with patient: 45 minutes  Musculoskeletal: Strength & Muscle Tone: within normal limits Gait & Station: normal Patient leans: N/A  Psychiatric Specialty Exam: Physical Exam  Constitutional: He is oriented to person, place, and time. He appears well-developed and well-nourished.  HENT:  Head: Normocephalic.  Respiratory: Effort normal.  Musculoskeletal: Normal range of motion.  Neurological: He is alert and oriented to person, place, and time.  Psychiatric: His speech is normal and behavior is normal. Thought content normal. Cognition and memory are normal. He expresses impulsivity. He exhibits a depressed mood.   Review of Systems  Psychiatric/Behavioral: Positive for depression. Negative for hallucinations, memory loss, substance abuse and suicidal ideas. The patient is not nervous/anxious and does not have insomnia.   All other systems reviewed and are negative.  Blood pressure 124/70, pulse 96, temperature 98.5 F (36.9 C), temperature source Oral, resp. rate 20, SpO2 95 %.There is no height or weight on file to calculate BMI. General Appearance: Casual Eye Contact:  Good Speech:  Clear and Coherent and Normal Rate Volume:  Normal Mood:  Depressed Affect:  Congruent and Depressed Thought Process:  Coherent, Goal Directed and Linear Orientation:  Full (Time, Place, and Person) Thought Content:  Logical Suicidal Thoughts:   No Homicidal Thoughts:  No Memory:  Immediate;   Good Recent;   Good Remote;   Fair Judgement:  Fair Insight:  Fair Psychomotor Activity:  Normal Concentration:  Concentration: Good and Attention Span: Good Recall:  Good Fund of Knowledge:  Good Language:  Good Akathisia:  No Handed:  Right AIMS (if indicated):    Assets:  Communication Skills Desire for Improvement Financial Resources/Insurance Housing Social Support ADL's:  Intact Cognition:  WNL   Mental Status Per Nursing Assessment::   On Admission:   Suicidal ideation  Demographic Factors:  Male and Low socioeconomic status  Loss Factors: Financial problems/change in socioeconomic status  Historical Factors: Impulsivity  Risk Reduction Factors:   Living with another person, especially a relative  Continued Clinical Symptoms:  Bipolar Disorder:   Depressive phase Depression:   Impulsivity More than one psychiatric diagnosis Previous Psychiatric Diagnoses and Treatments Medical Diagnoses and Treatments/Surgeries  Cognitive Features That Contribute To Risk:  Closed-mindedness    Suicide Risk:  Minimal: No identifiable suicidal ideation.  Patients presenting with no risk factors but with morbid ruminations; may be classified as minimal risk based on the severity of the depressive symptoms    Plan Of Care/Follow-up recommendations:  Activity:  as tolerated Diet:  Heart Healthy  Laveda AbbeLaurie Britton Marqueta Pulley, NP 01/23/2018, 12:39 PM

## 2018-01-23 NOTE — Consult Note (Signed)
[facility]   Reason for Consult:  Suicidal Ideation Referring Physician:  EDP Patient Identification: Allen RenMichael Swarthout V. MRN:  [id number] Principal Diagnosis: Schizoaffective disorder, bipolar type [facility](HCC) Diagnosis:   Patient Active Problem List   Diagnosis Date Noted  . Hypertension [I10] 01/09/2017  . Obesity [E66.9] 01/09/2017  . Schizoaffective disorder, bipolar type (HCC) [F25.0] 01/07/2017  . 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 Abdominal pain [R10.9] 11/20/2014  . Abdominal pain in male [R10.9] 11/18/2014    Total Time spent with patient: 45 minutes  Subjective:   Allen RenMichael Gonzalez V. is a 37 y.o. male patient admitted with suicidal ideation.  HPI: Pt was seen and chart reviewed with treatment team and Dr Sharma CovertNorman. Pt stated he has diabetes and ate too much sugar causing him to have diarrhea twice. [facility] He was discharged but his wife would not come and pick him up. Pt stated he started to walk home but didn't have any shoes and his feet were cold and his socks soaked. He became overwhelmed and came back to the ED and said he was suicidal. Pt's UDS and BAL negative. Today,  Pt denies suicidal/homicidal ideation, denies auditory/visual hallucinations and does not appear to be responding to internal stimuli. Pt stated he feels safe to discharge and he needs to get back to work. Pt is followed by Dr Omelia BlackwaterHeaden for psychiatry. Pt is psychiatrically clear for discharge.   Past Psychiatric History: As above  Risk to Self: None Risk to Others: None Prior Inpatient Therapy: Prior Inpatient Therapy: Yes Prior Therapy Dates: 2018 [facility] Reason for Treatment: MH issues Prior Outpatient Therapy: Prior Outpatient Therapy: Yes Prior Therapy Dates: 2018 Prior Therapy [facility]ilty[facility] Reason for Treatment: MH  issues Does patient have an ACCT team?: No Does patient have Intensive In-House Services?  : No Does patient have [facility] services? : No Does patient have P4CC services?: No  Past Medical History:  Past Medical History:  Diagnosis Date  . Anxiety   . Depression   . Hyperlipemia   . Hypertension   . Obesity   . Schizo affective schizophrenia (HCC)   . Sleep apnea     Past Surgical History:  Procedure Laterality Date  . VASECTOMY     Family History:  Family History  Problem Relation Age of Onset  . Heart attack Mother   . Cancer Mother   . Cancer Sister    Family Psychiatric  History: Unknown Social History:  Social History   Substance and Sexual Activity  Alcohol Use No  . Alcohol/week: 0.0 oz     Social History   Substance and Sexual Activity  Drug Use No    Social History   Socioeconomic History  . Marital status: Married    Spouse name: None  . Number of children: 1  . Years of education: 8111  . Highest education level: None  Social Needs  . Financial resource strain: None  . Food insecurity - worry: None  . Food insecurity - inability: None  . Transportation needs - medical: None  . Transportation needs - non-medical: None  Occupational History  . Occupation: [employer]  Tobacco Use  . Smoking status: Never Smoker  . Smokeless tobacco: Never Used  Substance and Sexual Activity  . Alcohol use: No    Alcohol/week: 0.0 oz  . Drug use: No  . Sexual  activity: Yes    Birth control/protection: None  Other Topics Concern  . None  Social History Narrative   Drinks caffeine daily    Additional Social History: N/A    Allergies:  No Known Allergies  Labs:  Results for orders placed or performed during the hospital encounter of 01/22/18 (from the past 48 hour(s))  Acetaminophen level     Status: Abnormal   Collection Time: 01/23/18  1:10 AM  Result Value Ref Range   Acetaminophen (Tylenol), Serum <10 (L) 10 - 30 ug/mL    Comment:        THERAPEUTIC  CONCENTRATIONS VARY SIGNIFICANTLY. A RANGE OF 10-30 ug/mL MAY BE AN EFFECTIVE CONCENTRATION FOR MANY PATIENTS. HOWEVER, SOME ARE BEST TREATED AT CONCENTRATIONS OUTSIDE THIS RANGE. ACETAMINOPHEN CONCENTRATIONS >150 ug/mL AT 4 HOURS AFTER INGESTION AND >50 ug/mL AT 12 HOURS AFTER INGESTION ARE OFTEN ASSOCIATED WITH TOXIC REACTIONS.   Salicylate level     Status: None   Collection Time: 01/23/18  1:10 AM  Result Value Ref Range   Salicylate Lvl <7.0 2.8 - 30.0 mg/dL  Ethanol     Status: None   Collection Time: 01/23/18  1:10 AM  Result Value Ref Range   Alcohol, Ethyl (B) <10 <10 mg/dL    Comment:        LOWEST DETECTABLE LIMIT FOR SERUM ALCOHOL IS 10 mg/dL FOR MEDICAL PURPOSES ONLY     Current Facility-Administered Medications  Medication Dose Route Frequency Provider Last Rate Last Dose  . amLODipine (NORVASC) tablet 10 mg  10 mg Oral Daily Wynetta Fines, MD   10 mg at 01/23/18 1610  . clonazePAM (KLONOPIN) tablet 2 mg  2 mg Oral Daily PRN Cherly Beach, DO      . hydrALAZINE (APRESOLINE) tablet 10 mg  10 mg Oral TID Wynetta Fines, MD   10 mg at 01/23/18 9604  . hydrochlorothiazide (MICROZIDE) capsule 12.5 mg  12.5 mg Oral Daily Wynetta Fines, MD   12.5 mg at 01/23/18 5409  . lamoTRIgine (LAMICTAL) tablet 200 mg  200 mg Oral BID Wynetta Fines, MD   200 mg at 01/23/18 8119   Current Outpatient Medications  Medication Sig Dispense Refill  . amLODipine (NORVASC) 10 MG tablet Take 1 tablet (10 mg total) daily by mouth. 30 tablet 1  . clonazePAM (KLONOPIN) 2 MG tablet Take 1 tablet (2 mg total) 3 (three) times daily by mouth. (Patient taking differently: Take 2 mg by mouth 3 (three) times daily as needed for anxiety. ) 15 tablet 0  . hydrALAZINE (APRESOLINE) 10 MG tablet Take 1 tablet (10 mg total) 3 (three) times daily by mouth. 30 tablet 1  . hydrochlorothiazide (MICROZIDE) 12.5 MG capsule Take 1 capsule (12.5 mg total) daily by mouth. 30 capsule 1  .  lamoTRIgine (LAMICTAL) 200 MG tablet Take 200 mg 2 (two) times daily by mouth.    . Multiple Vitamins-Minerals (MULTIVITAMIN ADULTS) TABS Take 1 tablet by mouth daily.    Marland Kitchen LATUDA 60 MG TABS Take 60 mg by mouth at bedtime.  1  . Lurasidone HCl 120 MG TABS Take 120 mg by mouth at bedtime.    . paliperidone (INVEGA) 3 MG 24 hr tablet Take 1 tablet (3 mg total) by mouth at bedtime. (Patient not taking: Reported on 01/22/2018) 30 tablet 0  . Paliperidone Palmitate (INVEGA SUSTENNA IM) Inject 1 Syringe into the muscle every 3 (three) months.    . pantoprazole (PROTONIX) 40 MG tablet Take 1 tablet (  40 mg total) by mouth daily. (Patient not taking: Reported on 03/22/2017) 30 tablet 0  . QUEtiapine (SEROQUEL) 200 MG tablet Take 400 mg by mouth at bedtime.    . traZODone (DESYREL) 100 MG tablet Take 1 tablet (100 mg total) by mouth at bedtime. (Patient not taking: Reported on 03/22/2017) 30 tablet 0  . venlafaxine XR (EFFEXOR-XR) 37.5 MG 24 hr capsule Take 1 capsule (37.5 mg total) by mouth daily with breakfast. (Patient not taking: Reported on 03/22/2017) 30 capsule 0    Musculoskeletal: Strength & Muscle Tone: within normal limits Gait & Station: normal Patient leans: N/A  Psychiatric Specialty Exam: Physical Exam  Nursing note and vitals reviewed. Constitutional: He is oriented to person, place, and time. He appears well-developed and well-nourished.  HENT:  Head: Normocephalic.  Neck: Normal range of motion.  Respiratory: Effort normal.  Musculoskeletal: Normal range of motion.  Neurological: He is alert and oriented to person, place, and time.  Psychiatric: His speech is normal and behavior is normal. Thought content normal. Cognition and memory are normal. He expresses impulsivity. He exhibits a depressed mood.    Review of Systems  Psychiatric/Behavioral: Positive for depression. Negative for hallucinations, memory loss, substance abuse and suicidal ideas. The patient is not nervous/anxious  and does not have insomnia.   All other systems reviewed and are negative.   Blood pressure 124/70, pulse 96, temperature 98.5 F (36.9 C), temperature source Oral, resp. rate 20, SpO2 95 %.There is no height or weight on file to calculate BMI.  General Appearance: Casual  Eye Contact:  Good  Speech:  Clear and Coherent and Normal Rate  Volume:  Normal  Mood:  Depressed  Affect:  Congruent and Depressed  Thought Process:  Coherent, Goal Directed and Linear  Orientation:  Full (Time, Place, and Person)  Thought Content:  Logical  Suicidal Thoughts:  No  Homicidal Thoughts:  No  Memory:  Immediate;   Good Recent;   Good Remote;   Fair  Judgement:  Fair  Insight:  Fair  Psychomotor Activity:  Normal  Concentration:  Concentration: Good and Attention Span: Good  Recall:  Good  Fund of Knowledge:  Good  Language:  Good  Akathisia:  No  Handed:  Right  AIMS (if indicated):    N/A  Assets:  Communication Skills Desire for Improvement Financial Resources/Insurance Housing Social Support  ADL's:  Intact  Cognition:  WNL  Sleep:    N/A     Treatment Plan Summary: Plan Schizoaffective disorder, bipolar type (HCC)  Discharge Home Follow up with Dr Omelia Blackwater for medication management Take all medications as pprescribed  Disposition: No evidence of imminent risk to self or others at present.   Patient does not meet criteria for psychiatric inpatient admission. Supportive therapy provided about ongoing stressors. Discussed crisis plan, support from social network, calling 911, coming to the Emergency Department, and calling Suicide Hotline.  Laveda Abbe, NP 01/23/2018 12:36 PM   Patient seen face-to-face for psychiatric evaluation, chart reviewed and case discussed with the physician extender and developed treatment plan. Reviewed the information documented and agree with the treatment plan.  Juanetta Beets, DO 01/23/18 7:47 PM

## 2018-01-23 NOTE — Discharge Instructions (Signed)
For your behavioral health needs, you are advised to continue treatment with [facility]: ° °     [facility] °     [address] °     [place], [place] [address] °     [phone] °

## 2018-01-23 NOTE — ED Notes (Signed)
Pt up in hall, will attempt to get wife to pick him up.

## 2018-01-23 NOTE — BH Assessment (Signed)
[facility]  Per Juanetta BeetsJacqueline Norman, DO, this pt does not require psychiatric hospitalization at this time.  [facility]  This has been included in pt's discharge instructions.  Pt's nurse, Wille CelesteJanie, has been notified.  Doylene Canninghomas Stellah Donovan, MA Triage Specialist [phone]

## 2018-01-23 NOTE — ED Notes (Signed)
On the phone 

## 2018-01-23 NOTE — ED Notes (Signed)
Chaplin contacted and will try to get pt some clothes.  Pt has been able to reach his wife and she has agreed to pay for a lyft ride home for him.

## 2018-01-24 LAB — LAMOTRIGINE LEVEL: Lamotrigine Lvl: 2 ug/mL (ref 2.0–20.0)

## 2018-04-11 ENCOUNTER — Emergency Department (HOSPITAL_COMMUNITY)
Admission: EM | Admit: 2018-04-11 | Discharge: 2018-04-12 | Disposition: A | Discharge status: Home / Self Care | Payer: Medicaid Other | Attending: Emergency Medicine | Admitting: Emergency Medicine

## 2018-04-11 ENCOUNTER — Other Ambulatory Visit: Payer: Self-pay

## 2018-04-11 ENCOUNTER — Encounter (HOSPITAL_COMMUNITY): Payer: Self-pay | Admitting: Emergency Medicine

## 2018-04-11 DIAGNOSIS — F259 Schizoaffective disorder, unspecified: Secondary | ICD-10-CM | POA: Diagnosis not present

## 2018-04-11 DIAGNOSIS — R45851 Suicidal ideations: Secondary | ICD-10-CM | POA: Insufficient documentation

## 2018-04-11 DIAGNOSIS — R440 Auditory hallucinations: Secondary | ICD-10-CM | POA: Diagnosis not present

## 2018-04-11 DIAGNOSIS — Z79899 Other long term (current) drug therapy: Secondary | ICD-10-CM | POA: Insufficient documentation

## 2018-04-11 DIAGNOSIS — F4325 Adjustment disorder with mixed disturbance of emotions and conduct: Secondary | ICD-10-CM | POA: Insufficient documentation

## 2018-04-11 DIAGNOSIS — I10 Essential (primary) hypertension: Secondary | ICD-10-CM | POA: Insufficient documentation

## 2018-04-11 DIAGNOSIS — Z046 Encounter for general psychiatric examination, requested by authority: Secondary | ICD-10-CM | POA: Diagnosis present

## 2018-04-11 LAB — URINALYSIS, ROUTINE W REFLEX MICROSCOPIC
Bilirubin Urine: NEGATIVE
GLUCOSE, UA: NEGATIVE mg/dL
Hgb urine dipstick: NEGATIVE
Ketones, ur: NEGATIVE mg/dL
NITRITE: NEGATIVE
PROTEIN: NEGATIVE mg/dL
Specific Gravity, Urine: 1.013 (ref 1.005–1.030)
pH: 6 (ref 5.0–8.0)

## 2018-04-11 LAB — CBC WITH DIFFERENTIAL/PLATELET
BASOS ABS: 0 10*3/uL (ref 0.0–0.1)
BASOS PCT: 0 %
EOS ABS: 0.2 10*3/uL (ref 0.0–0.7)
EOS PCT: 3 %
HCT: 37.3 % — ABNORMAL LOW (ref 39.0–52.0)
Hemoglobin: 13 g/dL (ref 13.0–17.0)
Lymphocytes Relative: 42 %
Lymphs Abs: 2.9 10*3/uL (ref 0.7–4.0)
MCH: 29.5 pg (ref 26.0–34.0)
MCHC: 34.9 g/dL (ref 30.0–36.0)
MCV: 84.6 fL (ref 78.0–100.0)
MONO ABS: 0.6 10*3/uL (ref 0.1–1.0)
Monocytes Relative: 8 %
Neutro Abs: 3.2 10*3/uL (ref 1.7–7.7)
Neutrophils Relative %: 47 %
PLATELETS: 210 10*3/uL (ref 150–400)
RBC: 4.41 MIL/uL (ref 4.22–5.81)
RDW: 13.5 % (ref 11.5–15.5)
WBC: 6.9 10*3/uL (ref 4.0–10.5)

## 2018-04-11 LAB — RAPID URINE DRUG SCREEN, HOSP PERFORMED
AMPHETAMINES: NOT DETECTED
BENZODIAZEPINES: POSITIVE — AB
Barbiturates: NOT DETECTED
COCAINE: NOT DETECTED
OPIATES: NOT DETECTED
Tetrahydrocannabinol: NOT DETECTED

## 2018-04-11 LAB — BASIC METABOLIC PANEL
ANION GAP: 7 (ref 5–15)
BUN: 8 mg/dL (ref 6–20)
CALCIUM: 9.2 mg/dL (ref 8.9–10.3)
CO2: 26 mmol/L (ref 22–32)
Chloride: 106 mmol/L (ref 101–111)
Creatinine, Ser: 1.25 mg/dL — ABNORMAL HIGH (ref 0.61–1.24)
Glucose, Bld: 114 mg/dL — ABNORMAL HIGH (ref 65–99)
Potassium: 3.2 mmol/L — ABNORMAL LOW (ref 3.5–5.1)
Sodium: 139 mmol/L (ref 135–145)

## 2018-04-11 LAB — ETHANOL: Alcohol, Ethyl (B): <10 mg/dL (ref <10)
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
## 2018-04-11 NOTE — ED Notes (Signed)
Bed: [facility] Expected date:  Expected time:  Means of arrival:  [facility]

## 2018-04-12 MED ORDER — ACETAMINOPHEN 325 MG PO TABS
650.0000 mg | ORAL_TABLET | Freq: Once | ORAL | Status: DC
  Filled 2018-04-12: qty 2

## 2018-04-12 NOTE — ED Notes (Signed)
Patient admitted on unit. Patient stated that he is hungry and will need to order "chinese food". When this writer told patient that outside foods are not allowed but will get a sandwich for him, patient requested 4 sandwiches. Patient continues to lament that he haven't eaten anything today meanwhile patient ate 2 sandwiches at the ED. 2 Sandwiched and some crackers offered to patient. Patient denies pain, SI/HI, AH/VH. Will continue to monitor patient.

## 2018-04-12 NOTE — ED Notes (Signed)
Patient complained of headache. Refused Tylenol offered. Patient constantly requesting for food and "caffeine".
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
## 2018-04-12 NOTE — ED Notes (Signed)
Patient denies pain and is resting comfortably.  

## 2018-04-12 NOTE — Progress Notes (Signed)
TTS consulted with Allen Berry, NP who recommends continued observation and to Allen Sosa reassessed in the AM by psych. EDP Allen Sosa, Allen J, PA-C and pt's nurse Allen Sosa, Allen B, RN have been advised of the disposition.  Allen BruinsAquicha Sami Froh, MSW, LCSW Therapeutic Triage Specialist  [phone]

## 2018-04-29 ENCOUNTER — Other Ambulatory Visit: Payer: Self-pay

## 2018-04-29 ENCOUNTER — Emergency Department
Admission: EM | Admit: 2018-04-29 | Discharge: 2018-04-29 | Disposition: A | Discharge status: Home / Self Care | Payer: Medicaid Other | Attending: Emergency Medicine | Admitting: Emergency Medicine

## 2018-04-29 DIAGNOSIS — I10 Essential (primary) hypertension: Secondary | ICD-10-CM | POA: Insufficient documentation

## 2018-04-29 DIAGNOSIS — R631 Polydipsia: Secondary | ICD-10-CM

## 2018-04-29 DIAGNOSIS — R350 Frequency of micturition: Secondary | ICD-10-CM | POA: Diagnosis not present

## 2018-04-29 DIAGNOSIS — Z79899 Other long term (current) drug therapy: Secondary | ICD-10-CM | POA: Insufficient documentation

## 2018-04-29 DIAGNOSIS — R3589 Other polyuria: Secondary | ICD-10-CM

## 2018-04-29 DIAGNOSIS — R358 Other polyuria: Secondary | ICD-10-CM

## 2018-04-29 LAB — BASIC METABOLIC PANEL
Anion gap: 7 (ref 5–15)
BUN: 6 mg/dL (ref 6–20)
CO2: 26 mmol/L (ref 22–32)
CREATININE: 1.11 mg/dL (ref 0.61–1.24)
Calcium: 8.9 mg/dL (ref 8.9–10.3)
Chloride: 103 mmol/L (ref 101–111)
GFR calc Af Amer: >60 mL/min (ref >60)
GFR calc non Af Amer: >60 mL/min (ref >60)
Glucose, Bld: 118 mg/dL — ABNORMAL HIGH (ref 65–99)
POTASSIUM: 3.2 mmol/L — AB (ref 3.5–5.1)
Sodium: 136 mmol/L (ref 135–145)

## 2018-04-29 LAB — URINALYSIS, COMPLETE (UACMP) WITH MICROSCOPIC
BACTERIA UA: NONE SEEN
Bilirubin Urine: NEGATIVE
GLUCOSE, UA: NEGATIVE mg/dL
Hgb urine dipstick: NEGATIVE
KETONES UR: NEGATIVE mg/dL
Nitrite: NEGATIVE
PH: 6 (ref 5.0–8.0)
Protein, ur: NEGATIVE mg/dL
SPECIFIC GRAVITY, URINE: 1.002 — AB (ref 1.005–1.030)

## 2018-04-29 LAB — CBC WITH DIFFERENTIAL/PLATELET
Basophils Absolute: 0.1 10*3/uL (ref 0–0.1)
Basophils Relative: 1 %
Eosinophils Absolute: 0.2 10*3/uL (ref 0–0.7)
Eosinophils Relative: 3 %
HEMATOCRIT: 38.6 % — AB (ref 40.0–52.0)
HEMOGLOBIN: 13.4 g/dL (ref 13.0–18.0)
LYMPHS ABS: 2 10*3/uL (ref 1.0–3.6)
LYMPHS PCT: 28 %
MCH: 30.1 pg (ref 26.0–34.0)
MCHC: 34.8 g/dL (ref 32.0–36.0)
MCV: 86.5 fL (ref 80.0–100.0)
MONOS PCT: 11 %
Monocytes Absolute: 0.8 10*3/uL (ref 0.2–1.0)
NEUTROS ABS: 4 10*3/uL (ref 1.4–6.5)
NEUTROS PCT: 57 %
Platelets: 211 10*3/uL (ref 150–440)
RBC: 4.46 MIL/uL (ref 4.40–5.90)
RDW: 14.3 % (ref 11.5–14.5)
WBC: 7 10*3/uL (ref 3.8–10.6)

## 2018-04-29 LAB — GLUCOSE, CAPILLARY: Glucose-Capillary: 130 mg/dL — ABNORMAL HIGH (ref 65–99)

## 2018-04-29 NOTE — ED Triage Notes (Signed)
Pt states he is "drinking about 30 bottles of water a day". Pt states has had years of thirst and frequent urination. Pt states he now has urinary incontinence. Pt states he is also having low back pain with increased urination and thirst.

## 2018-04-29 NOTE — ED Notes (Signed)
Patient ambulatory to stat desk with EMS from [place].  Per EMS patient with complaint of being thirsty and frequent urination.  Per EMS patient reports he normally drinks 30 bottles of water per day and this has not changed.  Patient wanting to be seen for thirst, frequent urination and low back pain.  EMS CBG - 104.

## 2018-04-29 NOTE — ED Provider Notes (Signed)
[facility] Emergency Department Provider Note  Time seen: 7:30 AM  I have reviewed the triage vital signs and the nursing notes.   HISTORY  Chief Complaint Urinary Frequency and Back Pain    HPI Allen Sosa is a 37 y.o. male with a past medical history of depression, hypertension, hyperlipidemia, schizophrenia, presents to the emergency department for concerns of drinking too much fluid.  According to the patient for the past many years he has been drinking up to 40 bottles of water per day.  States he is always thirsty and always feels like he needs to drink water.  He states it is gotten to the point over the past several years where he is having incontinence issues where he cannot get to the bathroom in time or urine leaks out after he is done urinating.  Patient denies dysuria or hematuria.  Patient states 1 of his friends who also drinks a lot of water was recently diagnosed with diabetes, patient was concerned that he could be diabetic.  Try to make a primary care appointment but stated it would be several weeks so he came to the emergency department for evaluation.  Largely negative review of systems otherwise.   Past Medical History:  Diagnosis Date  . Anxiety   . Depression   . Hyperlipemia   . Hypertension   . Obesity   . Schizo affective schizophrenia (HCC)   . Sleep apnea     Patient Active Problem List   Diagnosis Date Noted  . Hypertension 01/09/2017  . Obesity 01/09/2017  . Schizoaffective disorder, bipolar type (HCC) 01/07/2017  . 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 Abdominal pain 11/20/2014  . Abdominal pain in male 11/18/2014    Past Surgical History:  Procedure Laterality Date  . VASECTOMY      Prior to Admission medications   Medication Sig Start Date End Date Taking? Authorizing Provider  amLODipine (NORVASC) 10 MG tablet Take 1 tablet (10 mg total) daily by mouth. 11/04/17    Gwyneth Sprout, MD  hydrALAZINE (APRESOLINE) 10 MG tablet Take 1 tablet (10 mg total) 3 (three) times daily by mouth. 11/04/17   Gwyneth Sprout, MD  hydrochlorothiazide (MICROZIDE) 12.5 MG capsule Take 1 capsule (12.5 mg total) daily by mouth. 11/04/17   Gwyneth Sprout, MD  lamoTRIgine (LAMICTAL) 200 MG tablet Take 200 mg 2 (two) times daily by mouth.    [provider]  Lurasidone HCl 120 MG TABS Take 120 mg by mouth at bedtime.    [provider]  Paliperidone Palmitate (INVEGA SUSTENNA IM) Inject 1 Syringe into the muscle every 3 (three) months.    [provider]  QUEtiapine (SEROQUEL) 200 MG tablet Take 400 mg by mouth at bedtime.    [provider]    No Known Allergies  Family History  Problem Relation Age of Onset  . Heart attack Mother   . Cancer Mother   . Cancer Sister     Social History Social History   Tobacco Use  . Smoking status: Never Smoker  . Smokeless tobacco: Never Used  Substance Use Topics  . Alcohol use: No    Alcohol/week: 0.0 oz  . Drug use: No    Review of Systems Constitutional: Negative for fever. Eyes: Negative for visual complaints  ENT: Negative for recent illness/congestion Cardiovascular: Negative for chest pain. Respiratory: Negative for shortness of breath. Gastrointestinal: Negative for abdominal pain, vomiting Genitourinary: Very frequent urination but no  dysuria or hematuria Musculoskeletal: Does state mild intermittent back pain worse with movement but has been ongoing for years. Skin: Negative for skin complaints  Neurological: Negative for headache All other ROS negative  ____________________________________________   PHYSICAL EXAM:  VITAL SIGNS: ED Triage Vitals  Enc Vitals Group     BP 04/29/18 0223 (!) 163/98     Pulse Rate 04/29/18 0222 79     Resp 04/29/18 0222 18     Temp 04/29/18 0222 98.1 F (36.7 C)     Temp Source 04/29/18 0222 Oral     SpO2 04/29/18 0222 98 %      Weight 04/29/18 0220 (!) 460 lb (208.7 kg)     Height 04/29/18 0220  (1.905 m)     Head Circumference --      Peak Flow --      Pain Score 04/29/18 0220 7     Pain Loc --      Pain Edu? --      Excl. in GC? --    Constitutional: Alert and oriented. Well appearing and in no distress. Eyes: Normal exam ENT   Head: Normocephalic and atraumatic   Mouth/Throat: Mucous membranes are moist. Cardiovascular: Normal rate, regular rhythm. Respiratory: Normal respiratory effort without tachypnea nor retractions. Breath sounds are clear  Gastrointestinal: Soft and nontender. No distention.  Musculoskeletal: Nontender with normal range of motion in all extremities.  Neurologic:  Normal speech and language. No gross focal neurologic deficits  Skin:  Skin is warm, dry and intact.  Psychiatric: Mood and affect are normal.  ____________________________________________   INITIAL IMPRESSION / ASSESSMENT AND PLAN / ED COURSE  Pertinent labs & imaging results that were available during my care of the patient were reviewed by me and considered in my medical decision making (see chart for details).  Patient presents to the emergency department for frequent urination, frequent thirst and excessive water intake.  States he will go through approximately 40 bottles of water per day.  States this is been ongoing for years.  States he came to the emergency department today because he was concerned that he could be diabetic.  Denies dysuria or hematuria.  Patient's labs have resulted largely within normal limits besides very dilute urine, but no bacteria seen, no dysuria, urine culture has been added on.  Patient's blood glucose is largely normal 118, no glucose spillage into the urine.  Sodium level is normal.  I discussed with the patient this is likely due to polydipsia however the cause of the polydipsia is unclear and that he would need to follow-up with his primary care doctor for further testing.   Patient is agreeable to this plan of care and states he will schedule a follow-up appointment.  Patient symptoms appear to be long-standing, no acute abnormality, no definitive signs of diabetes.  ____________________________________________   FINAL CLINICAL IMPRESSION(S) / ED DIAGNOSES  Polydipsia Polyuria    Minna Antis, MD 04/29/18 [id number]

## 2018-04-29 NOTE — Discharge Instructions (Addendum)
As we discussed please attempt to limit the amount of water that you are drinking.  Please follow-up with your primary care doctor as soon as possible to discuss further work-up and testing.  Return to the emergency department for any acutely concerning symptoms.

## 2018-04-29 NOTE — ED Notes (Signed)
Spoke with dr. Dolores Frame regarding pt's ua and complaint. Order for cbc and bmp received.

## 2018-04-30 LAB — URINE CULTURE

## 2018-05-12 ENCOUNTER — Emergency Department (HOSPITAL_COMMUNITY): Payer: Medicaid Other

## 2018-05-12 ENCOUNTER — Encounter (HOSPITAL_COMMUNITY): Payer: Self-pay | Admitting: Emergency Medicine

## 2018-05-12 ENCOUNTER — Other Ambulatory Visit: Payer: Self-pay

## 2018-05-12 ENCOUNTER — Emergency Department (HOSPITAL_COMMUNITY)
Admission: EM | Admit: 2018-05-12 | Discharge: 2018-05-12 | Disposition: A | Discharge status: Home / Self Care | Payer: Medicaid Other | Attending: Emergency Medicine | Admitting: Emergency Medicine

## 2018-05-12 DIAGNOSIS — Z79899 Other long term (current) drug therapy: Secondary | ICD-10-CM | POA: Diagnosis not present

## 2018-05-12 DIAGNOSIS — I10 Essential (primary) hypertension: Secondary | ICD-10-CM | POA: Insufficient documentation

## 2018-05-12 DIAGNOSIS — R079 Chest pain, unspecified: Secondary | ICD-10-CM | POA: Insufficient documentation

## 2018-05-12 DIAGNOSIS — R0602 Shortness of breath: Secondary | ICD-10-CM | POA: Insufficient documentation

## 2018-05-12 LAB — I-STAT TROPONIN, ED
Troponin i, poc: 0 ng/mL (ref 0.00–0.08)
Troponin i, poc: 0 ng/mL (ref 0.00–0.08)

## 2018-05-12 LAB — BASIC METABOLIC PANEL
ANION GAP: 8 (ref 5–15)
BUN: 8 mg/dL (ref 6–20)
CALCIUM: 9 mg/dL (ref 8.9–10.3)
CO2: 28 mmol/L (ref 22–32)
Chloride: 100 mmol/L — ABNORMAL LOW (ref 101–111)
Creatinine, Ser: 1.47 mg/dL — ABNORMAL HIGH (ref 0.61–1.24)
GFR calc Af Amer: >60 mL/min (ref >60)
GFR calc non Af Amer: 60 mL/min — ABNORMAL LOW (ref >60)
Glucose, Bld: 104 mg/dL — ABNORMAL HIGH (ref 65–99)
POTASSIUM: 3.4 mmol/L — AB (ref 3.5–5.1)
Sodium: 136 mmol/L (ref 135–145)

## 2018-05-12 LAB — CBC
HEMATOCRIT: 36.5 % — AB (ref 39.0–52.0)
HEMOGLOBIN: 12.6 g/dL — AB (ref 13.0–17.0)
MCH: 29 pg (ref 26.0–34.0)
MCHC: 34.5 g/dL (ref 30.0–36.0)
MCV: 83.9 fL (ref 78.0–100.0)
Platelets: 225 10*3/uL (ref 150–400)
RBC: 4.35 MIL/uL (ref 4.22–5.81)
RDW: 13.2 % (ref 11.5–15.5)
WBC: 6.8 10*3/uL (ref 4.0–10.5)

## 2018-05-12 MED ORDER — AMLODIPINE BESYLATE 10 MG PO TABS: 10.0000 mg | ORAL_TABLET | Freq: Every day | ORAL | 1 refills | Status: DC

## 2018-05-12 MED ORDER — IOPAMIDOL (ISOVUE-370) INJECTION 76%
INTRAVENOUS | Status: AC
  Administered 2018-05-12: 100 mL
  Filled 2018-05-12: qty 100

## 2018-05-12 MED ORDER — LAMOTRIGINE 100 MG PO TABS
200.0000 mg | ORAL_TABLET | Freq: Once | ORAL | Status: AC
  Administered 2018-05-12: 200 mg via ORAL
  Filled 2018-05-12 (×3): qty 2

## 2018-05-12 MED ORDER — HYDROCHLOROTHIAZIDE 12.5 MG PO CAPS: 12.5000 mg | ORAL_CAPSULE | Freq: Every day | ORAL | 1 refills | Status: DC

## 2018-05-12 MED ORDER — HYDRALAZINE HCL 10 MG PO TABS: 10.0000 mg | ORAL_TABLET | Freq: Three times a day (TID) | ORAL | 1 refills | Status: DC

## 2018-05-12 NOTE — ED Notes (Signed)
Pt ambulated to restroom without assistance. NAD noted 

## 2018-05-12 NOTE — ED Provider Notes (Signed)
[facility] EMERGENCY DEPARTMENT Provider Note   CSN: [id number] Arrival date & time: 05/12/18  0142     History   Chief Complaint Chief Complaint  Patient presents with  . Chest Pain    HPI Allen Sosa is a 37 y.o. male.  Patient presents to the emergency department for evaluation of chest pain.  Patient reports that he has been experiencing pain in the center and left side of his chest with shortness of breath all day today.  Patient denies any nausea or diaphoresis.  He has not noticed any exertional component.  He reports that he is currently going through a separation with his wife and this has caused him a lot of stress.  No previous cardiac history.     Past Medical History:  Diagnosis Date  . Anxiety   . Depression   . Hyperlipemia   . Hypertension   . Obesity   . Schizo affective schizophrenia (HCC)   . Sleep apnea     Patient Active Problem List   Diagnosis Date Noted  . Hypertension 01/09/2017  . Obesity 01/09/2017  . Schizoaffective disorder, bipolar type (HCC) 01/07/2017  . 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 Abdominal pain 11/20/2014  . Abdominal pain in male 11/18/2014    Past Surgical History:  Procedure Laterality Date  . VASECTOMY          Home Medications    Prior to Admission medications   Medication Sig Start Date End Date Taking? Authorizing Provider  clonazePAM (KLONOPIN) 2 MG tablet Take 2 mg by mouth daily. 04/26/18  Yes [provider]  lamoTRIgine (LAMICTAL) 200 MG tablet Take 200 mg 2 (two) times daily by mouth.   Yes [provider]  Paliperidone Palmitate (INVEGA SUSTENNA IM) Inject 1 Syringe into the muscle every 30 (thirty) days.    Yes [provider]  amLODipine (NORVASC) 10 MG tablet Take 1 tablet (10 mg total) daily by mouth. Patient not taking: Reported on 05/12/2018 11/04/17   Gwyneth Sprout, MD  hydrALAZINE (APRESOLINE) 10  MG tablet Take 1 tablet (10 mg total) 3 (three) times daily by mouth. Patient not taking: Reported on 05/12/2018 11/04/17   Gwyneth Sprout, MD  hydrochlorothiazide (MICROZIDE) 12.5 MG capsule Take 1 capsule (12.5 mg total) daily by mouth. Patient not taking: Reported on 05/12/2018 11/04/17   Gwyneth Sprout, MD    Family History Family History  Problem Relation Age of Onset  . Heart attack Mother   . Cancer Mother   . Cancer Sister     Social History Social History   Tobacco Use  . Smoking status: Never Smoker  . Smokeless tobacco: Never Used  Substance Use Topics  . Alcohol use: No    Alcohol/week: 0.0 oz  . Drug use: No     Allergies   Patient has no known allergies.   Review of Systems Review of Systems  Respiratory: Positive for shortness of breath.   Cardiovascular: Positive for chest pain.  All other systems reviewed and are negative.    Physical Exam Updated Vital Signs BP (!) 167/105   Pulse 74   Temp 98.5 F (36.9 C) (Oral)   Resp 11   Ht  (1.905 m)   Wt (!) 208.7 kg (460 lb)   SpO2 99%   BMI 57.50 kg/m   Physical Exam  Constitutional: He is oriented to person, place, and time. He appears well-developed and well-nourished. No distress.  HENT:  Head: Normocephalic and atraumatic.  Right Ear: Hearing normal.  Left Ear: Hearing normal.  Nose: Nose normal.  Mouth/Throat: Oropharynx is clear and moist and mucous membranes are normal.  Eyes: Pupils are equal, round, and reactive to light. Conjunctivae and EOM are normal.  Neck: Normal range of motion. Neck supple.  Cardiovascular: Regular rhythm, S1 normal and S2 normal. Exam reveals no gallop and no friction rub.  No murmur heard. Pulmonary/Chest: Effort normal and breath sounds normal. No respiratory distress. He exhibits no tenderness.  Abdominal: Soft. Normal appearance and bowel sounds are normal. There is no hepatosplenomegaly. There is no tenderness. There is no rebound, no guarding,  no tenderness at McBurney's point and negative Murphy's sign. No hernia.  Musculoskeletal: Normal range of motion.  Neurological: He is alert and oriented to person, place, and time. He has normal strength. No cranial nerve deficit or sensory deficit. Coordination normal. GCS eye subscore is 4. GCS verbal subscore is 5. GCS motor subscore is 6.  Skin: Skin is warm, dry and intact. No rash noted. No cyanosis.  Psychiatric: He has a normal mood and affect. His speech is normal and behavior is normal. Thought content normal.  Nursing note and vitals reviewed.    ED Treatments / Results  Labs (all labs ordered are listed, but only abnormal results are displayed) Labs Reviewed  BASIC METABOLIC PANEL - Abnormal; Notable for the following components:      Result Value   Potassium 3.4 (*)    Chloride 100 (*)    Glucose, Bld 104 (*)    Creatinine, Ser 1.47 (*)    GFR calc non Af Amer 60 (*)    All other components within normal limits  CBC - Abnormal; Notable for the following components:   Hemoglobin 12.6 (*)    HCT 36.5 (*)    All other components within normal limits  I-STAT TROPONIN, ED  I-STAT TROPONIN, ED    EKG EKG Interpretation  Date/Time:  Sunday May 12 2018 02:02:50 EDT Ventricular Rate:  85 PR Interval:  172 QRS Duration: 90 QT Interval:  394 QTC Calculation: 468 R Axis:   -21 Text Interpretation:  Normal sinus rhythm Normal ECG Confirmed by Gilda Crease [phone]) on 05/12/2018 7:23:44 AM   Radiology Dg Chest 2 View  Result Date: 05/12/2018 CLINICAL DATA:  Acute onset of generalized chest pain and shortness of breath. High blood pressure. EXAM: CHEST - 2 VIEW COMPARISON:  Chest radiograph performed 11/04/2017 FINDINGS: There is mild elevation of the right hemidiaphragm. Mild vascular congestion is noted. No pleural effusion or pneumothorax is seen. The heart is normal in size. No acute osseous abnormalities are seen. IMPRESSION: Mild elevation of the right  hemidiaphragm. Mild vascular congestion noted. Lungs remain grossly clear. Electronically Signed   By: Roanna Raider M.D.   On: 05/12/2018 02:57    Procedures Procedures (including critical care time)  Medications Ordered in ED Medications  iopamidol (ISOVUE-370) 76 % injection (has no administration in time range)  lamoTRIgine (LAMICTAL) tablet 200 mg (has no administration in time range)     Initial Impression / Assessment and Plan / ED Course  I have reviewed the triage vital signs and the nursing notes.  Pertinent labs & imaging results that were available during my care of the patient were reviewed by me and considered in my medical decision making (see chart for details).     Patient presented to the ER for evaluation of chest pain.  Patient does  have cardiac risk factors, however, he has a heart score of 2.  He has had 2- troponins and a normal EKG.  I do not believe he needs any inpatient work-up for cardiac chest pain.  He is, however, a long distance truck driver.  He will therefore undergo CT angiography to rule out PE.  Will sign out to oncoming ER physician.  If CT is negative, can be discharged and follow-up with PCP.  Final Clinical Impressions(s) / ED Diagnoses   Final diagnoses:  Nonspecific chest pain    ED Discharge Orders    None       Gilda Crease, MD 05/12/18 [phone]

## 2018-05-12 NOTE — ED Notes (Signed)
Pt returns from ct scan. 

## 2018-05-12 NOTE — ED Notes (Signed)
IV team at bedside 

## 2018-05-12 NOTE — ED Notes (Signed)
Pt stated he is out of BP meds, MD made aware and prescriptions given

## 2018-05-12 NOTE — ED Triage Notes (Signed)
Pt c/o 8/10 left side cp, sob and HA since this morning. Denies any nausea or vomiting.

## 2018-05-31 ENCOUNTER — Emergency Department (HOSPITAL_COMMUNITY)
Admission: EM | Admit: 2018-05-31 | Discharge: 2018-05-31 | Disposition: A | Discharge status: Home / Self Care | Payer: Medicaid Other | Attending: Emergency Medicine | Admitting: Emergency Medicine

## 2018-05-31 ENCOUNTER — Encounter (HOSPITAL_COMMUNITY): Payer: Self-pay | Admitting: *Deleted

## 2018-05-31 DIAGNOSIS — F209 Schizophrenia, unspecified: Secondary | ICD-10-CM | POA: Diagnosis not present

## 2018-05-31 DIAGNOSIS — Z9119 Patient's noncompliance with other medical treatment and regimen: Secondary | ICD-10-CM | POA: Diagnosis not present

## 2018-05-31 DIAGNOSIS — Z008 Encounter for other general examination: Secondary | ICD-10-CM | POA: Diagnosis present

## 2018-05-31 DIAGNOSIS — R443 Hallucinations, unspecified: Secondary | ICD-10-CM

## 2018-05-31 DIAGNOSIS — Z79899 Other long term (current) drug therapy: Secondary | ICD-10-CM | POA: Insufficient documentation

## 2018-05-31 DIAGNOSIS — I10 Essential (primary) hypertension: Secondary | ICD-10-CM | POA: Diagnosis not present

## 2018-05-31 DIAGNOSIS — F339 Major depressive disorder, recurrent, unspecified: Secondary | ICD-10-CM | POA: Insufficient documentation

## 2018-05-31 LAB — CBC
HEMATOCRIT: 38.8 % — AB (ref 39.0–52.0)
Hemoglobin: 13.1 g/dL (ref 13.0–17.0)
MCH: 28.4 pg (ref 26.0–34.0)
MCHC: 33.8 g/dL (ref 30.0–36.0)
MCV: 84.2 fL (ref 78.0–100.0)
PLATELETS: 259 10*3/uL (ref 150–400)
RBC: 4.61 MIL/uL (ref 4.22–5.81)
RDW: 13.1 % (ref 11.5–15.5)
WBC: 5.6 10*3/uL (ref 4.0–10.5)

## 2018-05-31 LAB — RAPID URINE DRUG SCREEN, HOSP PERFORMED
AMPHETAMINES: NOT DETECTED
BENZODIAZEPINES: NOT DETECTED
Barbiturates: NOT DETECTED
Cocaine: NOT DETECTED
Opiates: NOT DETECTED
Tetrahydrocannabinol: NOT DETECTED

## 2018-05-31 LAB — SALICYLATE LEVEL: Salicylate Lvl: <7 mg/dL (ref 2.8–30.0)

## 2018-05-31 LAB — COMPREHENSIVE METABOLIC PANEL
ALBUMIN: 3.7 g/dL (ref 3.5–5.0)
ALK PHOS: 92 U/L (ref 38–126)
ALT: 26 U/L (ref 17–63)
AST: 27 U/L (ref 15–41)
Anion gap: 8 (ref 5–15)
BILIRUBIN TOTAL: 0.5 mg/dL (ref 0.3–1.2)
BUN: 5 mg/dL — AB (ref 6–20)
CO2: 26 mmol/L (ref 22–32)
CREATININE: 1.36 mg/dL — AB (ref 0.61–1.24)
Calcium: 9.1 mg/dL (ref 8.9–10.3)
Chloride: 103 mmol/L (ref 101–111)
GFR calc Af Amer: >60 mL/min (ref >60)
GLUCOSE: 116 mg/dL — AB (ref 65–99)
POTASSIUM: 3.7 mmol/L (ref 3.5–5.1)
Sodium: 137 mmol/L (ref 135–145)
TOTAL PROTEIN: 7.2 g/dL (ref 6.5–8.1)

## 2018-05-31 LAB — ACETAMINOPHEN LEVEL: Acetaminophen (Tylenol), Serum: <10 ug/mL — ABNORMAL LOW (ref 10–30)

## 2018-05-31 LAB — ETHANOL

## 2018-05-31 MED ORDER — ALUM & MAG HYDROXIDE-SIMETH 200-200-20 MG/5ML PO SUSP: 30.0000 mL | Freq: Four times a day (QID) | ORAL | Status: DC | PRN

## 2018-05-31 MED ORDER — ACETAMINOPHEN 325 MG PO TABS: 650.0000 mg | ORAL_TABLET | ORAL | Status: DC | PRN

## 2018-05-31 MED ORDER — ONDANSETRON HCL 4 MG PO TABS: 4.0000 mg | ORAL_TABLET | Freq: Three times a day (TID) | ORAL | Status: DC | PRN

## 2018-05-31 NOTE — ED Notes (Signed)
Telepsych speaking with patient.

## 2018-05-31 NOTE — ED Triage Notes (Signed)
Pt has been off his schizophrenia medications for the past "2 days, well a week." Pt reports he forgot to pick up his prescription before leaving [place] to come here. When asked if SI, pt shrugs his shoulders and states he doesn't know. Pt has been having auditory and visual hallucinations; has been hearing voices "that are trying to scare me" also has been seeing visions of his mother. Pt called [facility] to bring him to ED for help, pt calm and cooperative

## 2018-05-31 NOTE — ED Notes (Signed)
Patient ambulatory to bathroom with steady gait at this time 

## 2018-05-31 NOTE — ED Notes (Signed)
Pt verbalized understanding discharge instructions and denies any further needs or questions at this time. VS stable, ambulatory and steady gait.   

## 2018-05-31 NOTE — BH Assessment (Signed)
Tele Assessment Note   Patient Name: Allen Sosa MRN: [id number] Referring Physician: Charlestine Night, PA-C Location of Patient:  [facility] Location of Provider: Behavioral Health TTS Department  Allen Sosa is an 37 y.o. male present to [facility] with complaints of visual and auditory hallucinations. Patient report he has not been on his medication for at least two days. His psychiatrist Dr. Omelia Blackwater whom he report his last appointment Monday 05/27/2018. Report he failed to notify Dr. Omelia Blackwater that the medication he's currently taken he feels is not working. Report he's hearing voices more than having visual hallucinations. Report voices trying to make him feel paranoid. Patient denies suicidal / homicidal ideations.   Diagnosis: F20.9  Schizophrenia   Past Medical History:  Past Medical History:  Diagnosis Date  . Anxiety   . Depression   . Hyperlipemia   . Hypertension   . Obesity   . Schizo affective schizophrenia (HCC)   . Sleep apnea     Past Surgical History:  Procedure Laterality Date  . VASECTOMY      Family History:  Family History  Problem Relation Age of Onset  . Heart attack Mother   . Cancer Mother   . Cancer Sister     Social History:  reports that he has never smoked. He has never used smokeless tobacco. He reports that he does not drink alcohol or use drugs.  Additional Social History:  Alcohol / Drug Use Pain Medications: See MAR Prescriptions: See MAR Over the Counter: See MAR History of alcohol / drug use?: No history of alcohol / drug abuse  CIWA: CIWA-Ar BP: (!) 172/114 Pulse Rate: 83 COWS:    Allergies: No Known Allergies  Home Medications:  (Not in a hospital admission)  OB/GYN Status:  No LMP for male patient.  General Assessment Data Location of Assessment: [facility] ED TTS Assessment: In system Is this a Tele or Face-to-Face Assessment?: Face-to-Face Is this an Initial Assessment or a Re-assessment for this encounter?: Initial  Assessment Marital status: Separated Is patient pregnant?: No Pregnancy Status: No Living Arrangements: Non-relatives/Friends Can pt return to current living arrangement?: Yes Admission Status: Voluntary Is patient capable of signing voluntary admission?: Yes Referral Source: Self/Family/Friend Insurance type: Medicaid     Crisis Care Plan Living Arrangements: Non-relatives/Friends Name of Psychiatrist: Dr. Omelia Blackwater, MD Name of Therapist: none  Education Status Is patient currently in school?: No Is the patient employed, unemployed or receiving disability?: Receiving disability income  Risk to self with the past 6 months Suicidal Ideation: No-Not Currently/Within Last 6 Months Has patient been a risk to self within the past 6 months prior to admission? : No Suicidal Intent: No Has patient had any suicidal intent within the past 6 months prior to admission? : No Is patient at risk for suicide?: No Suicidal Plan?: Yes-Currently Present Has patient had any suicidal plan within the past 6 months prior to admission? : Yes Access to Means: Yes What has been your use of drugs/alcohol within the last 12 months?: denies Previous Attempts/Gestures: Yes How many times?: 1 Triggers for Past Attempts: Other personal contacts Intentional Self Injurious Behavior: None Family Suicide History: No Recent stressful life event(s): Conflict (Comment) Persecutory voices/beliefs?: No Depression: No Substance abuse history and/or treatment for substance abuse?: No Suicide prevention information given to non-admitted patients: Not applicable  Risk to Others within the past 6 months Homicidal Ideation: No Does patient have any lifetime risk of violence toward others beyond the six months prior to admission? : No  Thoughts of Harm to Others: No Current Homicidal Intent: No Current Homicidal Plan: No Access to Homicidal Means: No History of harm to others?: No Assessment of Violence: None  Noted Does patient have access to weapons?: No Criminal Charges Pending?: No Does patient have a court date: No Is patient on probation?: No  Psychosis Hallucinations: Auditory, Visual Delusions: None noted  Mental Status Report Appearance/Hygiene: In hospital gown Eye Contact: Good Motor Activity: Freedom of movement Speech: Logical/coherent Level of Consciousness: Alert Mood: Pleasant Affect: Preoccupied Anxiety Level: None Thought Processes: Coherent, Relevant Judgement: Impaired Orientation: Person, Place, Time, Appropriate for developmental age Obsessive Compulsive Thoughts/Behaviors: None  Cognitive Functioning Concentration: Normal Memory: Recent Intact, Remote Intact Is patient IDD: No Is patient DD?: No Insight: Fair Impulse Control: Fair Appetite: Good Have you had any weight changes? : No Change Sleep: No Change Total Hours of Sleep: (pt report he mainly take naps 2 or 3 hours at a time) Vegetative Symptoms: None  ADLScreening [facility] Assessment Services) Patient's cognitive ability adequate to safely complete daily activities?: Yes Patient able to express need for assistance with ADLs?: Yes Independently performs ADLs?: Yes (appropriate for developmental age)  Prior Inpatient Therapy Prior Inpatient Therapy: Yes Prior Therapy Dates: 2018 [facility] Reason for Treatment: SCHIZOAFFECTIVE  Prior Outpatient Therapy Prior Outpatient Therapy: Yes Prior Therapy Dates: CURRENT Prior Therapy Facilty/Provider(s): [facility] Reason for Treatment: MED MANAGEMENT Does patient have an ACCT team?: No Does patient have Intensive In-House Services?  : No Does patient have [facility] services? : No Does patient have P4CC services?: No  ADL Screening (condition at time of admission) Patient's cognitive ability adequate to safely complete daily activities?: Yes Is the patient deaf or have difficulty hearing?: No Does the patient have  difficulty seeing, even when wearing glasses/contacts?: No Does the patient have difficulty concentrating, remembering, or making decisions?: No Patient able to express need for assistance with ADLs?: Yes Does the patient have difficulty dressing or bathing?: No Independently performs ADLs?: Yes (appropriate for developmental age) Does the patient have difficulty walking or climbing stairs?: Yes       Abuse/Neglect Assessment (Assessment to be complete while patient is alone) Abuse/Neglect Assessment Can Be Completed: Yes Physical Abuse: Denies Verbal Abuse: Denies Sexual Abuse: Denies Exploitation of patient/patient's resources: Denies Self-Neglect: Denies     Merchant navy officerAdvance Directives (For Healthcare) Does Patient Have a Medical Advance Directive?: No Would patient like information on creating a medical advance directive?: No - Patient declined          Disposition:      Josslin Sanjuan 05/31/2018 8:30 AM

## 2018-05-31 NOTE — Progress Notes (Signed)
Patient is seen by me via tele-psych and I have consulted with Dr. Lucianne MussKumar.  Patient does not meet criteria for inpatient treatment.  He is psychiatrically cleared.  Patient denies SI/HI and admits to having some auditory hallucinations, but they are not commanding and are not telling him to hurt himself or anyone else.  He is requesting to be prescribed Zyprexa, however he then reports that he is already on [place]ve[place] Sustenna injection as well as Seroquel.  Continually recommended that patient follow-up with his current outpatient doctor.

## 2018-05-31 NOTE — [facility] Counselor (Addendum)
TTS along with Reola Calkinsravis Money, NP, meet with patient via tele-psych. Reola Calkinsravis Money, NP, spoke with patient concerning his medication concerns directing him to follow back up with his psychiatrist Dr. Omelia BlackwaterHeaden for medication changes. Patient report he last seen Dr. Omelia BlackwaterHeaden on Monday 05/27/2018 and was prescribed medications, however, he has refused to pick up the medication. Patient report his justification for not picking up the medication is because he does not like how the medication makes him feel. It was explained to patient that his psychiatrist would need to make medication adjustments. Patient expressed, "Do not worry about it, [place] go back to [place]."   Reola Calkinsravis Money, NP, psych-cleared patient and recommend discharge with instructions for patient to follow-up with his psychiatrist Dr. Omelia BlackwaterHeaden.

## 2018-05-31 NOTE — ED Notes (Signed)
Pt unable to fit in paper scrubs, hospital gown provided to patient

## 2018-05-31 NOTE — ED Notes (Signed)
Tele-sitter placed outside pt room.

## 2018-05-31 NOTE — ED Provider Notes (Signed)
[facility]ENC[facility] Provider Note   CSN: [id number] Arrival date & time: 05/31/18  [id number]     History   Chief Complaint Chief Complaint  Patient presents with  . Medical Clearance    HPI Allen RenMichael Tinnel V. is a 37 y.o. male.  HPI Patient presents to the emergency department with hallucinations and stating that he is feeling depressed.  The patient states that he had his injection on Monday and knows that it takes time to work but he was concerned about the hallucinations.  Patient states he is not suicidal homicidal at this time.  Patient states he has not been taking all his medicines as directed.  The patient denies chest pain, shortness of breath, headache,blurred vision, neck pain, fever, cough, weakness, numbness, dizziness, anorexia, edema, abdominal pain, nausea, vomiting, diarrhea, rash, back pain, dysuria, hematemesis, bloody stool, near syncope, or syncope. Past Medical History:  Diagnosis Date  . Anxiety   . Depression   . Hyperlipemia   . Hypertension   . Obesity   . Schizo affective schizophrenia (HCC)   . Sleep apnea     Patient Active Problem List   Diagnosis Date Noted  . Hypertension 01/09/2017  . Obesity 01/09/2017  . Schizoaffective disorder, bipolar type (HCC) 01/07/2017  . 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 Abdominal pain 11/20/2014  . Abdominal pain in male 11/18/2014    Past Surgical History:  Procedure Laterality Date  . VASECTOMY          Home Medications    Prior to Admission medications   Medication Sig Start Date End Date Taking? Authorizing Provider  amLODipine (NORVASC) 10 MG tablet Take 1 tablet (10 mg total) by mouth daily. 05/12/18   Raeford RazorKohut, Stephen, MD  clonazePAM (KLONOPIN) 2 MG tablet Take 2 mg by mouth daily. 04/26/18   [provider]  hydrALAZINE (APRESOLINE) 10 MG tablet Take 1 tablet (10 mg total) by mouth 3 (three) times daily. 05/12/18   Raeford RazorKohut,  Stephen, MD  hydrochlorothiazide (MICROZIDE) 12.5 MG capsule Take 1 capsule (12.5 mg total) by mouth daily. 05/12/18   Raeford RazorKohut, Stephen, MD  lamoTRIgine (LAMICTAL) 200 MG tablet Take 200 mg 2 (two) times daily by mouth.    [provider]  Paliperidone Palmitate (INVEGA SUSTENNA IM) Inject 1 Syringe into the muscle every 30 (thirty) days.     [provider]    Family History Family History  Problem Relation Age of Onset  . Heart attack Mother   . Cancer Mother   . Cancer Sister     Social History Social History   Tobacco Use  . Smoking status: Never Smoker  . Smokeless tobacco: Never Used  Substance Use Topics  . Alcohol use: No    Alcohol/week: 0.0 oz  . Drug use: No     Allergies   Patient has no known allergies.   Review of Systems Review of Systems  All other systems negative except as documented in the HPI. All pertinent positives and negatives as reviewed in the HPI. Physical Exam Updated Vital Signs BP (!) 150/100 (BP Location: Left Arm)   Pulse 84   Temp 98.9 F (37.2 C) (Oral)   Resp 18   SpO2 98%   Physical Exam  Constitutional: He is oriented to person, place, and time. He appears well-developed and well-nourished. No distress.  HENT:  Head: Normocephalic and atraumatic.  Mouth/Throat: Oropharynx is clear and moist.  Eyes: Pupils are equal, round,  and reactive to light.  Neck: Normal range of motion. Neck supple.  Cardiovascular: Normal rate, regular rhythm and normal heart sounds. Exam reveals no gallop and no friction rub.  No murmur heard. Pulmonary/Chest: Effort normal and breath sounds normal. No respiratory distress. He has no wheezes.  Neurological: He is alert and oriented to person, place, and time. He exhibits normal muscle tone. Coordination normal.  Skin: Skin is warm and dry. Capillary refill takes less than 2 seconds. No rash noted. No erythema.  Psychiatric: His behavior is normal. His mood appears not anxious. His  affect is not angry and not inappropriate. His speech is not rapid and/or pressured. He is not agitated and not aggressive. He exhibits a depressed mood. He expresses no homicidal and no suicidal ideation. He expresses no suicidal plans and no homicidal plans.  Nursing note and vitals reviewed.    ED Treatments / Results  Labs (all labs ordered are listed, but only abnormal results are displayed) Labs Reviewed  COMPREHENSIVE METABOLIC PANEL - Abnormal; Notable for the following components:      Result Value   Glucose, Bld 116 (*)    BUN 5 (*)    Creatinine, Ser 1.36 (*)    All other components within normal limits  ACETAMINOPHEN LEVEL - Abnormal; Notable for the following components:   Acetaminophen (Tylenol), Serum <10 (*)    All other components within normal limits  CBC - Abnormal; Notable for the following components:   HCT 38.8 (*)    All other components within normal limits  ETHANOL  SALICYLATE LEVEL  RAPID URINE DRUG SCREEN, HOSP PERFORMED    EKG None  Radiology No results found.  Procedures Procedures (including critical care time)  Medications Ordered in ED Medications - No data to display   Initial Impression / Assessment and Plan / ED Course  I have reviewed the triage vital signs and the nursing notes.  Pertinent labs & imaging results that were available during my care of the patient were reviewed by me and considered in my medical decision making (see chart for details).     Patient will be discharged based off of psych recommendations.  I have advised him he will need to see a psychiatrist as soon as possible.  Patient is advised to be has any changes in his condition he needs to return here patient agrees to the plan and all questions were answered.  Final Clinical Impressions(s) / ED Diagnoses   Final diagnoses:  None    ED Discharge Orders    None       Charlestine Night, PA-C 05/31/18 1141    Azalia Bilis, MD 05/31/18 1149

## 2018-05-31 NOTE — ED Notes (Signed)
Regular diet- NO SHARPS meal tray ordered for pt at this time

## 2018-05-31 NOTE — Discharge Instructions (Addendum)
Follow-up with your psychiatrist as soon as possible.  Return here as needed.

## 2018-05-31 NOTE — ED Notes (Signed)
Patient ambulatory to bathroom with steady gait at this time Offered pt to shower when the shower becomes available.

## 2018-05-31 NOTE — ED Notes (Signed)
Pt cleared by two psych counselors, pt states to this RN that he was not very forthcoming with information and that he was mean to the counselors. Pt states he does not have a doctor here and cannot get any medicines for another 3-4 months. Pt states he is scared and does not feel safe outside of the hospital. PA Lawyer made aware, [facility] discuss further.

## 2018-06-02 ENCOUNTER — Other Ambulatory Visit: Payer: Self-pay

## 2018-06-02 ENCOUNTER — Emergency Department (HOSPITAL_COMMUNITY)
Admission: EM | Admit: 2018-06-02 | Discharge: 2018-06-03 | Disposition: A | Discharge status: Other Acute Inpatient Hospital | Payer: Medicaid Other | Attending: Emergency Medicine | Admitting: Emergency Medicine

## 2018-06-02 ENCOUNTER — Encounter (HOSPITAL_COMMUNITY): Payer: Self-pay | Admitting: Emergency Medicine

## 2018-06-02 DIAGNOSIS — F32A Depression, unspecified: Secondary | ICD-10-CM

## 2018-06-02 DIAGNOSIS — F329 Major depressive disorder, single episode, unspecified: Secondary | ICD-10-CM

## 2018-06-02 DIAGNOSIS — F251 Schizoaffective disorder, depressive type: Secondary | ICD-10-CM | POA: Insufficient documentation

## 2018-06-02 DIAGNOSIS — Z79899 Other long term (current) drug therapy: Secondary | ICD-10-CM | POA: Insufficient documentation

## 2018-06-02 DIAGNOSIS — R45851 Suicidal ideations: Secondary | ICD-10-CM | POA: Insufficient documentation

## 2018-06-02 DIAGNOSIS — R440 Auditory hallucinations: Secondary | ICD-10-CM

## 2018-06-02 DIAGNOSIS — I10 Essential (primary) hypertension: Secondary | ICD-10-CM | POA: Insufficient documentation

## 2018-06-02 LAB — COMPREHENSIVE METABOLIC PANEL
ALK PHOS: 103 U/L (ref 38–126)
ALT: 30 U/L (ref 17–63)
AST: 30 U/L (ref 15–41)
Albumin: 4.1 g/dL (ref 3.5–5.0)
Anion gap: 9 (ref 5–15)
BUN: 11 mg/dL (ref 6–20)
CALCIUM: 9.2 mg/dL (ref 8.9–10.3)
CHLORIDE: 102 mmol/L (ref 101–111)
CO2: 26 mmol/L (ref 22–32)
CREATININE: 1.45 mg/dL — AB (ref 0.61–1.24)
GFR calc non Af Amer: >60 mL/min (ref >60)
Glucose, Bld: 107 mg/dL — ABNORMAL HIGH (ref 65–99)
Potassium: 3.8 mmol/L (ref 3.5–5.1)
SODIUM: 137 mmol/L (ref 135–145)
Total Bilirubin: 0.4 mg/dL (ref 0.3–1.2)
Total Protein: 7.8 g/dL (ref 6.5–8.1)

## 2018-06-02 LAB — CBC
HCT: 39.1 % (ref 39.0–52.0)
Hemoglobin: 13.7 g/dL (ref 13.0–17.0)
MCH: 29.5 pg (ref 26.0–34.0)
MCHC: 35 g/dL (ref 30.0–36.0)
MCV: 84.1 fL (ref 78.0–100.0)
PLATELETS: 251 10*3/uL (ref 150–400)
RBC: 4.65 MIL/uL (ref 4.22–5.81)
RDW: 13.3 % (ref 11.5–15.5)
WBC: 6.6 10*3/uL (ref 4.0–10.5)

## 2018-06-02 NOTE — ED Notes (Signed)
Bed: WLPT3 Expected date:  Expected time:  Means of arrival:  Comments: 

## 2018-06-02 NOTE — ED Provider Notes (Signed)
[facility]-EMERGENCY DEPT Provider Note   CSN: [id number] Arrival date & time: 06/02/18  2234     History   Chief Complaint Chief Complaint  Patient presents with  . Suicidal    HPI Allen Sosa is a 37 y.o. male with a hx of anxiety, depression, hypertension, schizoaffective schizophrenia, obesity presents to the Emergency Department complaining of gradual, persistent, progressively worsening suicidal ideation onset approximately 3 days to 1 week ago.  Patient reports he has been taking his medications as directed and has been receiving in [place] injections.  He reports that these are no longer working.  He reports he made a plan to walk out into traffic or lay down in front of a car in an effort to kill himself.  He reports he had a previous suicide attempt in 2005 via slitting his wrist.  He endorses vague homicidal ideation without specific plan or person.  He reports he is hearing voices that say terrible things about him but do not command him to do things.  He denies visual hallucinations.  Patient reports he is very depressed and needs help.  No specific aggravating or alleviating factors.  Patient denies fever, chills, headache, neck pain, chest pain, shortness of breath, abdominal pain, nausea, vomiting, diarrhea, weakness, dizziness, syncope, dysuria.  The history is provided by the patient and medical records. No language interpreter was used.    Past Medical History:  Diagnosis Date  . Anxiety   . Depression   . Hyperlipemia   . Hypertension   . Obesity   . Schizo affective schizophrenia (HCC)   . Sleep apnea     Patient Active Problem List   Diagnosis Date Noted  . Hypertension 01/09/2017  . Obesity 01/09/2017  . Schizoaffective disorder, bipolar type (HCC) 01/07/2017  . 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 Abdominal pain 11/20/2014  . Abdominal pain in male 11/18/2014    Past Surgical History:    Procedure Laterality Date  . VASECTOMY          Home Medications    Prior to Admission medications   Medication Sig Start Date End Date Taking? Authorizing Provider  amLODipine (NORVASC) 10 MG tablet Take 1 tablet (10 mg total) by mouth daily. 05/12/18  Yes Raeford Razor, MD  clonazePAM (KLONOPIN) 2 MG tablet Take 2 mg by mouth 3 (three) times daily.   Yes [provider]  hydrALAZINE (APRESOLINE) 100 MG tablet Take 100 mg by mouth daily. 05/12/18  Yes [provider]  hydrochlorothiazide (MICROZIDE) 12.5 MG capsule Take 1 capsule (12.5 mg total) by mouth daily. 05/12/18  Yes Raeford Razor, MD  ibuprofen (ADVIL,MOTRIN) 200 MG tablet Take 200 mg by mouth every 6 (six) hours as needed for fever, headache, mild pain, moderate pain or cramping.   Yes [provider]  Multiple Vitamin (MULTIVITAMIN WITH MINERALS) TABS tablet Take 1 tablet by mouth 2 (two) times daily.   Yes [provider]  OLANZapine (ZYPREXA) 10 MG tablet Take 10 mg by mouth at bedtime.   Yes [provider]    Family History Family History  Problem Relation Age of Onset  . Heart attack Mother   . Cancer Mother   . Cancer Sister     Social History Social History   Tobacco Use  . Smoking status: Never Smoker  . Smokeless tobacco: Never Used  Substance Use Topics  . Alcohol use: No    Alcohol/week: 0.0 oz  .  Drug use: No     Allergies   Patient has no known allergies.   Review of Systems Review of Systems  Constitutional: Negative for appetite change, diaphoresis, fatigue, fever and unexpected weight change.  HENT: Negative for mouth sores.   Eyes: Negative for visual disturbance.  Respiratory: Negative for cough, chest tightness, shortness of breath and wheezing.   Cardiovascular: Negative for chest pain.  Gastrointestinal: Negative for abdominal pain, constipation, diarrhea, nausea and vomiting.  Endocrine: Negative for polydipsia, polyphagia and  polyuria.  Genitourinary: Negative for dysuria, frequency, hematuria and urgency.  Musculoskeletal: Negative for back pain and neck stiffness.  Skin: Negative for rash.  Allergic/Immunologic: Negative for immunocompromised state.  Neurological: Negative for syncope, light-headedness and headaches.  Hematological: Does not bruise/bleed easily.  Psychiatric/Behavioral: Positive for dysphoric mood and suicidal ideas. Negative for sleep disturbance. The patient is not nervous/anxious.      Physical Exam Updated Vital Signs BP (!) 143/93 (BP Location: Left Arm)   Pulse 86   Temp 98.2 F (36.8 C) (Oral)   Resp 18   Ht 6\' 3"  (1.905 m)   Wt (!) 208.7 kg (460 lb)   SpO2 95%   BMI 57.50 kg/m   Physical Exam  Constitutional: He appears well-developed and well-nourished. No distress.  Awake, alert, nontoxic appearance  HENT:  Head: Normocephalic and atraumatic.  Mouth/Throat: No oropharyngeal exudate.  Eyes: Conjunctivae are normal. No scleral icterus.  Neck: Normal range of motion.  Cardiovascular: Normal rate, regular rhythm and intact distal pulses.  Pulmonary/Chest: Effort normal and breath sounds normal. No respiratory distress. He has no wheezes.  Equal chest expansion  Abdominal: Soft. He exhibits no mass. There is no tenderness. There is no rebound and no guarding.  Musculoskeletal: Normal range of motion. He exhibits no edema.  Neurological: He is alert.  Speech is clear and goal oriented Moves extremities without ataxia  Skin: Skin is warm and dry. He is not diaphoretic.  Psychiatric: He is actively hallucinating. He exhibits a depressed mood. He expresses homicidal and suicidal ideation. He expresses suicidal plans. He expresses no homicidal plans.  Nursing note and vitals reviewed.    ED Treatments / Results  Labs (all labs ordered are listed, but only abnormal results are displayed) Labs Reviewed  COMPREHENSIVE METABOLIC PANEL - Abnormal; Notable for the following  components:      Result Value   Glucose, Bld 107 (*)    Creatinine, Ser 1.45 (*)    All other components within normal limits  ACETAMINOPHEN LEVEL - Abnormal; Notable for the following components:   Acetaminophen (Tylenol), Serum <10 (*)    All other components within normal limits  ETHANOL  SALICYLATE LEVEL  CBC  RAPID URINE DRUG SCREEN, HOSP PERFORMED    EKG None  Radiology No results found.  Procedures Procedures (including critical care time)  Medications Ordered in ED Medications  sodium chloride 0.9 % bolus 1,000 mL (1,000 mLs Intravenous New Bag/Given 06/03/18 0040)  alum & mag hydroxide-simeth (MAALOX/MYLANTA) 200-200-20 MG/5ML suspension 30 mL (has no administration in time range)  nicotine (NICODERM CQ - dosed in mg/24 hours) patch 21 mg (has no administration in time range)     Initial Impression / Assessment and Plan / ED Course  I have reviewed the triage vital signs and the nursing notes.  Pertinent labs & imaging results that were available during my care of the patient were reviewed by me and considered in my medical decision making (see chart for details).  Clinical Course  as of Jun 03 121  Mon Jun 03, 2018  0011 Creatinine elevated up from 1.37 several days ago.  Patient appears to have increasing serum creatinine since January 2019.  Will give fluids today.  Creatinine(!): 1.45 [HM]  0012 Potassium normal.  Potassium: 3.8 [HM]  0122 Per TTS, patient meets inpatient criteria   [HM]    Clinical Course User Index [HM] Halleigh Comes, Dahlia ClientHannah, PA-C    Patient presents with suicidal ideation and a plan.  Labs are overall reassuring.  Serum creatinine is elevated up from several days ago and steadily increasing over the last 6 months.  We will give fluids here in the emergency department.  At this time there is no emergent medical condition for which immediate intervention is required.  Patient will be assessed by TTS.  Final Clinical Impressions(s) / ED  Diagnoses   Final diagnoses:  Suicidal ideation  Depression, unspecified depression type  Auditory hallucinations    ED Discharge Orders    None       Mardene SayerMuthersbaugh, Boyd KerbsHannah, PA-C 06/03/18 0123    Long, Arlyss RepressJoshua G, MD 06/03/18 1054

## 2018-06-02 NOTE — ED Triage Notes (Signed)
Pt presents with [facility] for evaluation of suicidal ideation and not being on his medications. Pt reports that he had plan of wanting to jump in front of a car. Pt currently presents voluntarily.

## 2018-06-03 ENCOUNTER — Inpatient Hospital Stay (HOSPITAL_COMMUNITY)
Admission: AD | Admit: 2018-06-03 | Discharge: 2018-06-13 | DRG: 885 | Disposition: A | Discharge status: Home / Self Care | Payer: Medicaid Other | Source: Intra-hospital | Attending: Psychiatry | Admitting: Psychiatry

## 2018-06-03 ENCOUNTER — Other Ambulatory Visit: Payer: Self-pay

## 2018-06-03 ENCOUNTER — Encounter (HOSPITAL_COMMUNITY): Payer: Self-pay | Admitting: *Deleted

## 2018-06-03 DIAGNOSIS — Z79899 Other long term (current) drug therapy: Secondary | ICD-10-CM

## 2018-06-03 DIAGNOSIS — R45851 Suicidal ideations: Secondary | ICD-10-CM | POA: Insufficient documentation

## 2018-06-03 DIAGNOSIS — G259 Extrapyramidal and movement disorder, unspecified: Secondary | ICD-10-CM | POA: Diagnosis not present

## 2018-06-03 DIAGNOSIS — R451 Restlessness and agitation: Secondary | ICD-10-CM | POA: Diagnosis present

## 2018-06-03 DIAGNOSIS — F250 Schizoaffective disorder, bipolar type: Secondary | ICD-10-CM | POA: Diagnosis present

## 2018-06-03 DIAGNOSIS — F29 Unspecified psychosis not due to a substance or known physiological condition: Secondary | ICD-10-CM | POA: Diagnosis present

## 2018-06-03 DIAGNOSIS — F339 Major depressive disorder, recurrent, unspecified: Secondary | ICD-10-CM | POA: Diagnosis not present

## 2018-06-03 DIAGNOSIS — Z630 Problems in relationship with spouse or partner: Secondary | ICD-10-CM | POA: Diagnosis not present

## 2018-06-03 DIAGNOSIS — F1099 Alcohol use, unspecified with unspecified alcohol-induced disorder: Secondary | ICD-10-CM | POA: Diagnosis not present

## 2018-06-03 DIAGNOSIS — E785 Hyperlipidemia, unspecified: Secondary | ICD-10-CM | POA: Diagnosis present

## 2018-06-03 DIAGNOSIS — G4730 Sleep apnea, unspecified: Secondary | ICD-10-CM | POA: Diagnosis present

## 2018-06-03 DIAGNOSIS — F329 Major depressive disorder, single episode, unspecified: Secondary | ICD-10-CM | POA: Diagnosis not present

## 2018-06-03 DIAGNOSIS — Z560 Unemployment, unspecified: Secondary | ICD-10-CM | POA: Diagnosis not present

## 2018-06-03 DIAGNOSIS — R635 Abnormal weight gain: Secondary | ICD-10-CM | POA: Diagnosis not present

## 2018-06-03 DIAGNOSIS — F32A Depression, unspecified: Secondary | ICD-10-CM | POA: Insufficient documentation

## 2018-06-03 DIAGNOSIS — Z6841 Body Mass Index (BMI) 40.0 and over, adult: Secondary | ICD-10-CM | POA: Diagnosis not present

## 2018-06-03 DIAGNOSIS — E669 Obesity, unspecified: Secondary | ICD-10-CM | POA: Diagnosis present

## 2018-06-03 DIAGNOSIS — R450 Nervousness: Secondary | ICD-10-CM | POA: Diagnosis not present

## 2018-06-03 DIAGNOSIS — Z590 Homelessness: Secondary | ICD-10-CM | POA: Diagnosis not present

## 2018-06-03 DIAGNOSIS — R440 Auditory hallucinations: Secondary | ICD-10-CM

## 2018-06-03 DIAGNOSIS — G4700 Insomnia, unspecified: Secondary | ICD-10-CM | POA: Diagnosis present

## 2018-06-03 DIAGNOSIS — I10 Essential (primary) hypertension: Secondary | ICD-10-CM | POA: Diagnosis present

## 2018-06-03 DIAGNOSIS — Z915 Personal history of self-harm: Secondary | ICD-10-CM | POA: Diagnosis not present

## 2018-06-03 DIAGNOSIS — Z9114 Patient's other noncompliance with medication regimen: Secondary | ICD-10-CM | POA: Diagnosis not present

## 2018-06-03 DIAGNOSIS — F419 Anxiety disorder, unspecified: Secondary | ICD-10-CM | POA: Diagnosis present

## 2018-06-03 LAB — RAPID URINE DRUG SCREEN, HOSP PERFORMED
Amphetamines: NOT DETECTED
BENZODIAZEPINES: NOT DETECTED
Barbiturates: NOT DETECTED
COCAINE: NOT DETECTED
Opiates: NOT DETECTED
Tetrahydrocannabinol: NOT DETECTED

## 2018-06-03 LAB — SALICYLATE LEVEL: Salicylate Lvl: <7 mg/dL (ref 2.8–30.0)

## 2018-06-03 LAB — ETHANOL: Alcohol, Ethyl (B): <10 mg/dL (ref <10)

## 2018-06-03 LAB — ACETAMINOPHEN LEVEL

## 2018-06-03 MED ORDER — HYDROXYZINE HCL 25 MG PO TABS
50.0000 mg | ORAL_TABLET | Freq: Three times a day (TID) | ORAL | Status: DC | PRN
  Administered 2018-06-03: 50 mg via ORAL
  Filled 2018-06-03: qty 2

## 2018-06-03 MED ORDER — NICOTINE 21 MG/24HR TD PT24: 21.0000 mg | MEDICATED_PATCH | Freq: Every day | TRANSDERMAL | Status: DC

## 2018-06-03 MED ORDER — HYDROXYZINE HCL 50 MG PO TABS
50.0000 mg | ORAL_TABLET | Freq: Three times a day (TID) | ORAL | Status: DC | PRN
  Administered 2018-06-03 – 2018-06-11 (×8): 50 mg via ORAL
  Filled 2018-06-03 (×9): qty 1
  Filled 2018-06-03: qty 10

## 2018-06-03 MED ORDER — AMLODIPINE BESYLATE 10 MG PO TABS
10.0000 mg | ORAL_TABLET | Freq: Every day | ORAL | Status: DC
  Administered 2018-06-04 – 2018-06-12 (×9): 10 mg via ORAL
  Filled 2018-06-03 (×4): qty 1
  Filled 2018-06-03: qty 2
  Filled 2018-06-03 (×7): qty 1

## 2018-06-03 MED ORDER — ALUM & MAG HYDROXIDE-SIMETH 200-200-20 MG/5ML PO SUSP: 30.0000 mL | ORAL | Status: DC | PRN

## 2018-06-03 MED ORDER — OLANZAPINE 10 MG PO TABS
10.0000 mg | ORAL_TABLET | Freq: Every day | ORAL | Status: DC
  Administered 2018-06-03: 10 mg via ORAL
  Filled 2018-06-03 (×4): qty 1

## 2018-06-03 MED ORDER — TRAZODONE HCL 50 MG PO TABS: 50.0000 mg | ORAL_TABLET | Freq: Every evening | ORAL | Status: DC | PRN

## 2018-06-03 MED ORDER — TRAZODONE HCL 100 MG PO TABS
100.0000 mg | ORAL_TABLET | Freq: Every evening | ORAL | Status: DC | PRN
  Administered 2018-06-03 – 2018-06-12 (×4): 100 mg via ORAL
  Filled 2018-06-03 (×3): qty 1
  Filled 2018-06-03: qty 7
  Filled 2018-06-03 (×4): qty 1

## 2018-06-03 MED ORDER — OXCARBAZEPINE 300 MG PO TABS
300.0000 mg | ORAL_TABLET | Freq: Two times a day (BID) | ORAL | Status: DC
  Administered 2018-06-03 – 2018-06-12 (×16): 300 mg via ORAL
  Filled 2018-06-03 (×23): qty 1

## 2018-06-03 MED ORDER — SODIUM CHLORIDE 0.9 % IV BOLUS
1000.0000 mL | Freq: Once | INTRAVENOUS | Status: AC
  Administered 2018-06-03: 1000 mL via INTRAVENOUS

## 2018-06-03 MED ORDER — TRAZODONE HCL 100 MG PO TABS
200.0000 mg | ORAL_TABLET | Freq: Once | ORAL | Status: AC
  Administered 2018-06-03: 200 mg via ORAL
  Filled 2018-06-03 (×2): qty 2

## 2018-06-03 MED ORDER — MAGNESIUM HYDROXIDE 400 MG/5ML PO SUSP: 30.0000 mL | Freq: Every day | ORAL | Status: DC | PRN

## 2018-06-03 MED ORDER — HYDRALAZINE HCL 50 MG PO TABS
100.0000 mg | ORAL_TABLET | Freq: Every day | ORAL | Status: DC
  Administered 2018-06-04 – 2018-06-12 (×9): 100 mg via ORAL
  Filled 2018-06-03 (×12): qty 2

## 2018-06-03 MED ORDER — HYDROCHLOROTHIAZIDE 12.5 MG PO CAPS
12.5000 mg | ORAL_CAPSULE | Freq: Every day | ORAL | Status: DC
  Administered 2018-06-04 – 2018-06-12 (×9): 12.5 mg via ORAL
  Filled 2018-06-03 (×12): qty 1

## 2018-06-03 MED ORDER — OXCARBAZEPINE 300 MG PO TABS
300.0000 mg | ORAL_TABLET | Freq: Two times a day (BID) | ORAL | Status: DC
  Administered 2018-06-03: 300 mg via ORAL
  Filled 2018-06-03: qty 1

## 2018-06-03 MED ORDER — ALUM & MAG HYDROXIDE-SIMETH 200-200-20 MG/5ML PO SUSP: 30.0000 mL | Freq: Four times a day (QID) | ORAL | Status: DC | PRN

## 2018-06-03 MED ORDER — ACETAMINOPHEN 325 MG PO TABS
650.0000 mg | ORAL_TABLET | Freq: Four times a day (QID) | ORAL | Status: DC | PRN
  Administered 2018-06-05 – 2018-06-11 (×6): 650 mg via ORAL
  Filled 2018-06-03 (×6): qty 2

## 2018-06-03 NOTE — Progress Notes (Signed)
Allen Sosa is a 37 year old male pt admitted on voluntary basis. On admission he spoke about how he has been feeling depressed and suicidal and reports that he has been having on-going auditory hallucinations. He reports a stressor in his life that his wife kicked him out of the house. He reports that he was kicked out because he does not have a job and does not do anything around the house to help. He does report taking his medications as prescribed and reports that he has been taking latuda, seroquel and an invega injection. He [place] [facility] [place] but he reports he does not feel they are working and thinks he may need some new medications. He denies any substance abuse issues. He reports that since his wife kicked him out he has no place to go and would like help finding a shelter to go to. Allen Sosa denies any current SI on admission and is able to contract for safety in the hospital. He was oriented to the unit and safety maintained.

## 2018-06-03 NOTE — ED Notes (Signed)
Pt A&O x 3, no distress noted, calm & cooperative.  Watching TV at present.  Pending [facility] transport.  [facility] by [facility] Morrie SheldonAshley [facility].

## 2018-06-03 NOTE — BH Assessment (Addendum)
Assessment Note  Allen Sosa is an 37 y.o. male, who presents voluntary and unaccompanied to [facility]. Clinician asked the pt, "what brought you to the hospital?" Pt reported, "feeling hopeless, I want to hurt myself by laying in front of a car or run over me." Pt reported, he has been feeling suicidal for about a week. Pt reported, the following stressors: wanting to work, life, issues with family, current living situation. Pt reported, wanting a list of shelters because his wife put his out a couple months ago. Pt reported, wanting to beat people he does not know to death with his hands. Pt reported, hearing voices saying things that makes him upset. Pt reported, the voices makes him want to commit suicide. Pt reported, the voices tells him to do things. Pt did not give a specific examples to what he hears and the commands. Pt denies, access to weapons.   Pt denies abuse. Pt denies substance use. Pt's UDS is pending. Pt reported, being linked to Dr. Omelia Blackwater for medication management. Pt reported, when he was at the hospital he was prescribed Zyprexa and Klonopin. Pt reported, it was recommends he follow up with Dr. Omelia Blackwater to get a refills. Pt reported, he seen Dr. Omelia Blackwater last Monday (05/27/2018), and he did not discuss getting refills of Zyprexa and Klonopin. Pt reported, he got his invega injection, that he feels does work. Pt reported, he goes every three weeks to get his injection. Pt reported, previous inpatient admission at [facility], [place], about 3-4 years ago.   Pt presents alert in scrubs with logical/coherent speech. Pt's eye contact was good. Pt's mood was helpless/sad. Pt's affect was congruent with mood. Pt's thought process was coherent/relevant. Pt's judgement was partial. Pt was oriented x4. Pt's insight and impulse control are fair. Pt reported, if discharged from [facility] he could not contract for safety. Pt reported, if inpatient treatment is recommended he would sign-in voluntarily.    Diagnosis: F25.1 Schizoaffective Disorder, depressive type.  Past Medical History:  Past Medical History:  Diagnosis Date  . Anxiety   . Depression   . Hyperlipemia   . Hypertension   . Obesity   . Schizo affective schizophrenia (HCC)   . Sleep apnea     Past Surgical History:  Procedure Laterality Date  . VASECTOMY      Family History:  Family History  Problem Relation Age of Onset  . Heart attack Mother   . Cancer Mother   . Cancer Sister     Social History:  reports that he has never smoked. He has never used smokeless tobacco. He reports that he does not drink alcohol or use drugs.  Additional Social History:  Alcohol / Drug Use Pain Medications: See MAR Prescriptions: See MAR Over the Counter: See MAR History of alcohol / drug use?: (Pt denies. UDS is pending. )  CIWA: CIWA-Ar BP: (!) 143/93 Pulse Rate: 86 COWS:    Allergies: No Known Allergies  Home Medications:  (Not in a hospital admission)  OB/GYN Status:  No LMP for male patient.  General Assessment Data Location of Assessment: [facility] ED TTS Assessment: In system Is this a Tele or Face-to-Face Assessment?: Face-to-Face Is this an Initial Assessment or a Re-assessment for this encounter?: Initial Assessment Marital status: Married Living Arrangements: Non-relatives/Friends Can pt return to current living arrangement?: Yes Admission Status: Voluntary Is patient capable of signing voluntary admission?: Yes Referral Source: Self/Family/Friend Insurance type: Medicaid.      Crisis Care Plan Living Arrangements:  Non-relatives/Friends Legal Guardian: Other:(Self. ) Name of Psychiatrist: Dr. Omelia Blackwater. Name of Therapist: NA  Education Status Is patient currently in school?: No Is the patient employed, unemployed or receiving disability?: Receiving disability income  Risk to self with the past 6 months Suicidal Ideation: Yes-Currently Present Has patient been a risk to self within the past 6  months prior to admission? : Yes Suicidal Intent: Yes-Currently Present Has patient had any suicidal intent within the past 6 months prior to admission? : Yes Is patient at risk for suicide?: Yes Suicidal Plan?: Yes-Currently Present Has patient had any suicidal plan within the past 6 months prior to admission? : Yes Specify Current Suicidal Plan: Pt reported, to lay in front of a car.  Access to Means: Yes Specify Access to Suicidal Means: Pt has access to traffic.  What has been your use of drugs/alcohol within the last 12 months?: Pt's UDS is pending.  Previous Attempts/Gestures: Yes How many times?: 1 Other Self Harm Risks: Pt denies.  Triggers for Past Attempts: Unknown Intentional Self Injurious Behavior: Cutting Comment - Self Injurious Behavior: Pt reported, slitting his wrist in 2005 as a suicide attempt.  Family Suicide History: No Recent stressful life event(s): Conflict (Comment)(with wife. ) Persecutory voices/beliefs?: No Depression: Yes Depression Symptoms: Tearfulness, Feeling worthless/self pity, Isolating Substance abuse history and/or treatment for substance abuse?: No Suicide prevention information given to non-admitted patients: Not applicable  Risk to Others within the past 6 months Homicidal Ideation: Yes-Currently Present Does patient have any lifetime risk of violence toward others beyond the six months prior to admission? : No(Pt denies. ) Thoughts of Harm to Others: Yes-Currently Present Comment - Thoughts of Harm to Others: Pt reported, wanting to beat people to death that he does not know.  Current Homicidal Intent: Yes-Currently Present Current Homicidal Plan: Yes-Currently Present Describe Current Homicidal Plan: Pt reported, beating people to death.  Access to Homicidal Means: Yes Describe Access to Homicidal Means: Pt reported, using his hands.  Identified Victim: Pt reported, people he does not know.  History of harm to others?: No(Pt denies.  ) Assessment of Violence: None Noted Violent Behavior Description: NA Does patient have access to weapons?: No(Pt denies.) Criminal Charges Pending?: No Does patient have a court date: No Is patient on probation?: No  Psychosis Hallucinations: Auditory, With command Delusions: None noted  Mental Status Report Appearance/Hygiene: In scrubs Eye Contact: Good Motor Activity: Unremarkable Speech: Logical/coherent Level of Consciousness: Alert Mood: Helpless, Sad Affect: Other (Comment)(congruent with mood. ) Anxiety Level: Minimal Thought Processes: Coherent, Relevant Judgement: Partial Orientation: Person, Place, Time, Situation Obsessive Compulsive Thoughts/Behaviors: None  Cognitive Functioning Concentration: Normal Memory: Recent Intact Is patient IDD: No Is patient DD?: No Insight: Fair Impulse Control: Fair Appetite: Fair Sleep: Decreased Total Hours of Sleep: (Pt reported, taking 2-3 hour naps. ) Vegetative Symptoms: None  ADLScreening [facility]) Patient's cognitive ability adequate to safely complete daily activities?: Yes Patient able to express need for assistance with ADLs?: Yes Independently performs ADLs?: Yes (appropriate for developmental age)  Prior Inpatient Therapy Prior Inpatient Therapy: Yes Prior Therapy Dates: Pt reported, 3-4 years ago.  Prior Therapy Facilty/Provider(s): [facility], [place].  Reason for Treatment: Paranoia.   Prior Outpatient Therapy Prior Outpatient Therapy: Yes Prior Therapy Dates: Current. Prior Therapy Facilty/Provider(s): Dr. Omelia Blackwater.  Reason for Treatment: Medication management.  Does patient have an ACCT team?: No Does patient have Intensive In-House Services?  : No Does patient have [facility] services? : No Does patient have P4CC  services?: No  ADL Screening (condition at time of admission) Patient's cognitive ability adequate to safely complete daily activities?: Yes Is the patient deaf or have  difficulty hearing?: No Does the patient have difficulty seeing, even when wearing glasses/contacts?: No Does the patient have difficulty concentrating, remembering, or making decisions?: Yes Patient able to express need for assistance with ADLs?: Yes Does the patient have difficulty dressing or bathing?: No Independently performs ADLs?: Yes (appropriate for developmental age) Does the patient have difficulty walking or climbing stairs?: No Weakness of Legs: None Weakness of Arms/Hands: None  Home Assistive Devices/Equipment Home Assistive Devices/Equipment: None    Abuse/Neglect Assessment (Assessment to be complete while patient is alone) Abuse/Neglect Assessment Can Be Completed: Yes Physical Abuse: Denies(Pt denies. ) Verbal Abuse: Denies(Pt denies. ) Sexual Abuse: Denies(Pt denies. ) Exploitation of patient/patient's resources: Denies(Pt denies. ) Self-Neglect: Denies(Pt denies. )     Advance Directives (For Healthcare) Does Patient Have a Medical Advance Directive?: No Would patient like information on creating a medical advance directive?: No - Patient declined    Additional Information 1:1 In Past 12 Months?: No CIRT Risk: No Elopement Risk: No Does patient have medical clearance?: Yes     Disposition: Donell SievertSpencer Simon, PA recommends inpatient treatment. Disposition discussed with Dahlia ClientHannah, PA and Roseanna RainbowIlene, RN. TTS to seek placement.   Disposition Initial Assessment Completed for this Encounter: Yes Disposition of Patient: (inpatient treatment. ) Patient refused recommended treatment: No Mode of transportation if patient is discharged?: N/A  On Site Evaluation by: Redmond Pullingreylese D Dennies Coate, MS, LPC, CRC. Reviewed with Physician:  Dahlia ClientHannah, PA and Donell SievertSpencer Simon, PA.  Redmond Pullingreylese D Kashis Penley 06/03/2018 1:40 AM   Redmond Pullingreylese D Georgeana Oertel, MS, [facility], [facility] Triage Specialist [phone]

## 2018-06-03 NOTE — Progress Notes (Signed)
Pt stated he was very depressed and concerned about getting medication for his depression, pt stated he was concerned about his sleep also. Pt was educated on Trazodone that it should help with both .

## 2018-06-03 NOTE — BH Assessment (Signed)
[facility]  Per Allen BeetsJacqueline Norman, DO, this pt requires psychiatric hospitalization at this time.  Allen Heinrichina Tate, RN, [id number] pt at 17:00.  Pt has signed Voluntary Admission and Consent for Treatment, as well as Consent to Release Information to [facility], and a notification call has been placed.  Signed forms have been fax[facility].  Pt's nurse, Allen Sheldonshley, has been notified, and agrees to send original paperwork along with pt via Juel [place], and to call report to [phone].  Allen Sosa, [place] Behavioral Health Coordinator [phone]

## 2018-06-03 NOTE — Tx Team (Signed)
Initial Treatment Plan 06/03/2018 10:01 PM Allen Sosa Allen Sosa    PATIENT STRESSORS: Financial difficulties Marital or family conflict Medication change or noncompliance Occupational concerns   PATIENT STRENGTHS: Ability for insight Average or above average intelligence Capable of independent living General fund of knowledge   PATIENT IDENTIFIED PROBLEMS: Depression Suicidal thoughts Auditory hallucinations "Get stable on my medicine" "find a place to go when I leave here" "Need someone to come out and check on me"                     DISCHARGE CRITERIA:  Ability to meet basic life and health needs Improved stabilization in mood, thinking, and/or behavior Verbal commitment to aftercare and medication compliance  PRELIMINARY DISCHARGE PLAN: Attend aftercare/continuing care group  PATIENT/FAMILY INVOLVEMENT: This treatment plan has been presented to and reviewed with the patient, Allen RenMichael Daubenspeck V., and/or family member, .  The patient and family have been given the opportunity to ask questions and make suggestions.  Eldana Isip, [place], [place]RN 06/03/2018, 10:01 PM

## 2018-06-03 NOTE — ED Notes (Signed)
Patient arrived to the unit and is pleasant and cooperative with care. Pt with suicidal ideations but verbally contracts for safety. Pt given soda and snacks upon request. No distress noted at this time.

## 2018-06-03 NOTE — ED Notes (Signed)
Pt in bed eyes closed, appears to be sleeping. No s/s of distress noted, breathing non labored, respirations equal. Special checks q 15 mins in place for safety, Video monitoring in place. Will continue to monitor.

## 2018-06-04 DIAGNOSIS — G4700 Insomnia, unspecified: Secondary | ICD-10-CM

## 2018-06-04 DIAGNOSIS — Z818 Family history of other mental and behavioral disorders: Secondary | ICD-10-CM

## 2018-06-04 DIAGNOSIS — F329 Major depressive disorder, single episode, unspecified: Secondary | ICD-10-CM

## 2018-06-04 DIAGNOSIS — F419 Anxiety disorder, unspecified: Secondary | ICD-10-CM

## 2018-06-04 DIAGNOSIS — Z630 Problems in relationship with spouse or partner: Secondary | ICD-10-CM

## 2018-06-04 DIAGNOSIS — F1099 Alcohol use, unspecified with unspecified alcohol-induced disorder: Secondary | ICD-10-CM

## 2018-06-04 DIAGNOSIS — R450 Nervousness: Secondary | ICD-10-CM

## 2018-06-04 DIAGNOSIS — R45851 Suicidal ideations: Secondary | ICD-10-CM

## 2018-06-04 DIAGNOSIS — Z590 Homelessness: Secondary | ICD-10-CM

## 2018-06-04 DIAGNOSIS — Z915 Personal history of self-harm: Secondary | ICD-10-CM

## 2018-06-04 DIAGNOSIS — R635 Abnormal weight gain: Secondary | ICD-10-CM

## 2018-06-04 DIAGNOSIS — Z560 Unemployment, unspecified: Secondary | ICD-10-CM

## 2018-06-04 DIAGNOSIS — F250 Schizoaffective disorder, bipolar type: Principal | ICD-10-CM

## 2018-06-04 MED ORDER — TRAZODONE HCL 100 MG PO TABS
200.0000 mg | ORAL_TABLET | Freq: Once | ORAL | Status: AC
  Administered 2018-06-05: 200 mg via ORAL
  Filled 2018-06-04 (×2): qty 2

## 2018-06-04 MED ORDER — ARIPIPRAZOLE 15 MG PO TABS
15.0000 mg | ORAL_TABLET | Freq: Every day | ORAL | Status: DC
  Administered 2018-06-04: 15 mg via ORAL
  Filled 2018-06-04 (×3): qty 1

## 2018-06-04 NOTE — Progress Notes (Signed)
Pt stated he thinks 200 mg of Trazodone would be ok for him to go to sleep at night

## 2018-06-04 NOTE — BHH Group Notes (Signed)
LCSW Group Therapy Note   06/04/2018 1:15pm   Type of Therapy and Topic:  Group Therapy:  Positive Affirmations   Participation Level:  Did Not Attend  Description of Group: This group addressed positive affirmation toward self and others. Patients went around the room and identified two positive things about themselves and two positive things about a peer in the room. Patients reflected on how it felt to share something positive with others, to identify positive things about themselves, and to hear positive things from others. Patients were encouraged to have a daily reflection of positive characteristics or circumstances.  Therapeutic Goals 1. Patient will verbalize two of their positive qualities 2. Patient will demonstrate empathy for others by stating two positive qualities about a peer in the group 3. Patient will verbalize their feelings when voicing positive self affirmations and when voicing positive affirmations of others 4. Patients will discuss the potential positive impact on their wellness/recovery of focusing on positive traits of self and others. Summary of Patient Progress:    Therapeutic Modalities Cognitive Behavioral Therapy Motivational Interviewing  Allen RogueRodney B Rionna Sosa, KentuckyLCSW 06/04/2018 3:39 PM

## 2018-06-04 NOTE — BHH Counselor (Addendum)
Adult Comprehensive Assessment  Patient ID: Allen Sosa., male   DOB: [birth year], 37 y.o.   MRN: [id number]  Information Source: Patient  Current Stressors:  Patient states their goals for this hospitilization and ongoing recovery are:: get stable on meds, decreased symptoms, depression Employment / Job issues: Web designer / Lack of resources (include bankruptcy): Fixed income-money has been tight-source of stress between he and wife-has not been paying bills-says he spends impulsively Bereavement:  States he feels "lost" since his mother died in past 18 months  Living/Environment/Situation:  Living Arrangements: Friend Living conditions (as described by patient or guardian): "I will go to a shelter from here." How long has patient lived in current situation?: Couple of months sinc ehe and wife split up  Family History:  Marital status: Separated for a couple of months Number of Years Married: 8 years-been separated before for the same reason What types of issues is patient dealing with in the relationship?:  Unsure of future holds Are you sexually active?: No What is your sexual orientation?:  straight Does patient have children?: Yes How many children?: 1 How is patient's relationship with their children?: good  Childhood History:  By whom was/is the patient raised?:  Mother/father and step-parent Additional childhood history information:  Mother and step dad Description of patient's relationship with caregiver when they were a child: Dad has bi-polar depression, and so does grandfather-would visit him when growing up Patient's description of current relationship with people who raised him/her:  Mother had 3 strokes and died a couple years ago-relationship with dad is off and on Does patient have siblings?:  Yes Number of Siblings:  4 Description of patient's current relationship with siblings: all half sisters, close with younger sister Did patient suffer any  verbal/emotional/physical/sexual abuse as a child?:  No Did patient suffer from severe childhood neglect?:  No Has patient ever been sexually abused/assaulted/raped as an adolescent or adult?:No Was the patient ever a victim of a crime or a disaster?: No Witnessed domestic violence?:  No Has patient been effected by domestic violence as an adult?:  No  Education:  Highest grade of school patient has completed:  11 Currently a Consulting civil engineer?: No Learning disability?: No  Employment/Work Situation:   Employment situation:  On disability Why is patient on disability:  Bipolar How long has patient been on disability:since age of 66 What is the longest time patient has a held a job?: (always part time, driving truck and bouncing Has patient ever been in the [employer]?: No Are There Guns or Other Weapons in Your Home?:  No  Financial Resources:   Surveyor, quantity resources: Writer Does patient have a Lawyer or guardian?:No  Alcohol/Substance Abuse:   Has alcohol/substance abuse ever caused legal problems?: No  Social Support System:   Forensic psychologist System:  Production assistant, radio System:  Friends, Step father,  Type of faith/religion: (P) Christian How does patient's faith help to cope with current illness?: (P) "Attitude of gratitude"  Leisure/Recreation:   Leisure and Hobbies: get on computer, reading magazines-entertainment and sports  Strengths/Needs:   What is the patient's perception of their strengths?: driving but no car, listening to others Patient states they can use these personal strengths during their treatment to contribute to their recovery: listen to the Dr so I know how to help myself Patient states these barriers may affect/interfere with their treatment: follow through Patient states these barriers may affect their return to the community: homelessness  Discharge Plan:  Currently receiving community mental health services:  Yes (From Whom)([facility]) Patient states concerns and preferences for aftercare planning are: None Patient states they will know when they are safe and ready for discharge when: "When the voices and SI are gone" Does patient have access to transportation?: Yes Does patient have financial barriers related to discharge medications?: No Plan for living situation after discharge: shelter Will patient be returning to same living situation after discharge?: No  Summary/Recommendations:   Summary and Recommendations (to be completed by the evaluator): Allen Sosa is a 37 YO AA male diagnosed with Schizoaffective D/O, Bipolar type.  He presents voluntarily asking for help with AH and  depression, with subsequent SI. Stressors include occupational, as he is currently not engaged in anything productive during the day, and homelessness, as his wife recently kciked him out of the house. At d/c Allen Sosa plans [facility] and [facility]  While here, he can benefit from crises stabilization, medication management, therapeutic milieu, coordination with outpt provider and referral to [facility] PATH program.  Allen Sosa. 06/04/2018

## 2018-06-04 NOTE — BHH Suicide Risk Assessment (Signed)
[facility]   Nursing information obtained from:  Patient Demographic factors:  Male, Low socioeconomic status, Unemployed Current Mental Status:  Suicidal ideation indicated by patient, Self-harm thoughts Loss Factors:  Loss of significant relationship Historical Factors:  Family history of mental illness or substance abuse Risk Reduction Factors:  Responsible for children under 37 years of age, Positive coping skills or problem solving skills  Total Time spent with patient: 1 hour Principal Problem: Schizoaffective disorder, bipolar type (HCC) Diagnosis:   Patient Active Problem List   Diagnosis Date Noted  . Auditory hallucinations [R44.0]   . Depression [F32.9]   . Suicidal ideation [R45.851]   . Hypertension [I10] 01/09/2017  . Obesity [E66.9] 01/09/2017  . Schizoaffective disorder, bipolar type (HCC) [F25.0] 01/07/2017  . 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 Abdominal pain [R10.9] 11/20/2014  . Abdominal pain in male [R10.9] 11/18/2014   Subjective Data: See H&P for details  Continued Clinical Symptoms:  Alcohol Use Disorder Identification Test Final Score (AUDIT): 0 The "Alcohol Use Disorders Identification Test", Guidelines for Use in Primary Care, Second Edition.  World Science writerHealth Organization [facility](WHO). Score between 0-7:  no or low risk or alcohol related problems. Score between 8-15:  moderate risk of alcohol related problems. Score between 16-19:  high risk of alcohol related problems. Score 20 or above:  warrants further diagnostic evaluation for alcohol dependence and treatment.   CLINICAL FACTORS:   Severe Anxiety and/or Agitation Bipolar Disorder:   Depressive phase Schizophrenia:   Paranoid or undifferentiated type More than one psychiatric diagnosis Currently Psychotic Previous Psychiatric Diagnoses and Treatments  Psychiatric Specialty Exam: Physical Exam  Nursing note and vitals  reviewed.   ROS- see H&P for details  Blood pressure (!) 165/103, pulse 77, temperature 98.6 F (37 C), temperature source Oral, resp. rate 18, height 6\' 2"  (1.88 m), weight (!) 198.7 kg (438 lb).Body mass index is 56.24 kg/m.    COGNITIVE FEATURES THAT CONTRIBUTE TO RISK:  None    SUICIDE RISK:   Moderate:  Frequent suicidal ideation with limited intensity, and duration, some specificity in terms of plans, no associated intent, good self-control, limited dysphoria/symptomatology, some risk factors present, and identifiable protective factors, including available and accessible social support.  PLAN OF CARE: See H&P for details  I certify that inpatient services furnished can reasonably be expected to improve the patient's condition.   Micheal Likenshristopher T Jazara Swiney, MD 06/04/2018, 4:01 PM

## 2018-06-04 NOTE — Progress Notes (Signed)
Patient states that he has been having suicidal thoughts with no plan.  Patient has been compliant with all medications, attended groups and engaged in unit activities.  Patient reported feeling increasingly depressed but stated he would be able to maintain safety in the hospital.   Assess patient for safety, offer medications as prescribed, engage patient in 1:1 staff talks.   Patient able to contract for safety.  Continue to monitor as planned.    

## 2018-06-04 NOTE — H&P (Signed)
Psychiatric Admission Assessment Adult  Patient Identification: Allen Sosa MRN:  [id number] Date of Evaluation:  06/04/2018 Chief Complaint:  SCHIZOAFFECTIVE Principal Diagnosis: Schizoaffective disorder, bipolar type ([place]) Diagnosis:   Patient Active Problem List   Diagnosis Date Noted  . Auditory hallucinations [R44.0]   . Depression [F32.9]   . Suicidal ideation [R45.851]   . Hypertension [I10] 01/09/2017  . Obesity [E66.9] 01/09/2017  . Schizoaffective disorder, bipolar type ([place]) [F25.0] 01/07/2017  . 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 Abdominal pain [R10.9] 11/20/2014  . Abdominal pain in male [R10.9] 11/18/2014   History of Present Illness:   Allen Sosa is a 37 y/o M with history of schizoaffective disorder bipolar type who was admitted voluntarily from [facility] with worsening symptoms of depression, SI with plan to lie in the road, and worsening AH. Pt was medically cleared and then transferred to [facility] for additional treatment and stabilization.  Upon initial interview, pt shares, "I've been really stressed out the last week; it feels like everything has come crashing down on me in the last week." Pt cites multiple stressors including homelessness (he was kicked out by his wife about 2 months ago and he has been staying with a friend), unemployment, death of his mother within the last 1.5 years, and weight gain of 150lbs in about 2 years. Pt reports he developed SI with plan to "lie down in the road" for about the last 1 week. He also has HI of thoughts to hurt random other people by punching them, but he has not acted on his thoughts and he has no intent to hurt others. He endorses depressive symptoms of poor sleep (middle insomnia), anhedonia, guilty feelings, low energy, poor concentration, fluctuant appetite, and psychomotor retardation. He denies symptoms of mania, OCD, and PTSD. He endorses AH which tell him demeaning  comments. He denies VH. He denies illicit substance use.  Discussed with patient about treatment options. He follows up at [facility] and he reports that his home medications consist of [place] injection (last injection about 1 week ago). Pt reports he has been on Sustenna for about 1 year, and he does not think it has been helpful. He also shares previous trials of latuda, seroquel, and zyprexa which he thinks were not helpful and possibly made AH worse. Pt has already been started on trileptal and zyprexa upon admission, and he agrees to continue trileptal but he would like to change zyprexa. Pt is in agreement to trial of abilify. He was in agreement with the above plan, and he had no further questions, comments, or concerns.   Associated Signs/Symptoms: Depression Symptoms:  depressed mood, anhedonia, insomnia, psychomotor retardation, fatigue, feelings of worthlessness/guilt, difficulty concentrating, hopelessness, suicidal thoughts with specific plan, anxiety, loss of energy/fatigue, weight gain, increased appetite, decreased appetite, (Hypo) Manic Symptoms:  Distractibility, Anxiety Symptoms:  Excessive Worry, Psychotic Symptoms:  Hallucinations: Auditory PTSD Symptoms: NA Total Time spent with patient: 1 hour  Past Psychiatric History:  -Previous diagnosis of schizoaffective disorder bipolar type - pt reports about "20-25" inpatient stays with last admission about 1 year ago to [place] follow up at [facility] - Hx of suicide attempt x2 by cutting his wrist with last attempt in 2005  Is the patient at risk to self? Yes.    Has the patient been a risk to self in the past 6 months? Yes.    Has the patient been a risk to self within  the distant past? Yes.    Is the patient a risk to others? Yes.    Has the patient been a risk to others in the past 6 months? Yes.    Has the patient been a risk to others within the distant past? Yes.     Prior  Inpatient Therapy:   Prior Outpatient Therapy:    Alcohol Screening: 1. How often do you have a drink containing alcohol?: Never 2. How many drinks containing alcohol do you have on a typical day when you are drinking?: 1 or 2 3. How often do you have six or more drinks on one occasion?: Never AUDIT-C Score: 0 4. How often during the last year have you found that you were not able to stop drinking once you had started?: Never 5. How often during the last year have you failed to do what was normally expected from you becasue of drinking?: Never 6. How often during the last year have you needed a first drink in the morning to get yourself going after a heavy drinking session?: Never 7. How often during the last year have you had a feeling of guilt of remorse after drinking?: Never 8. How often during the last year have you been unable to remember what happened the night before because you had been drinking?: Never 9. Have you or someone else been injured as a result of your drinking?: No 10. Has a relative or friend or a doctor or another health worker been concerned about your drinking or suggested you cut down?: No Alcohol Use Disorder Identification Test Final Score (AUDIT): 0 Intervention/Follow-up: AUDIT Score <7 follow-up not indicated Substance Abuse History in the last 12 months:  No. Consequences of Substance Abuse: NA Previous Psychotropic Medications: Yes  Psychological Evaluations: Yes  Past Medical History:  Past Medical History:  Diagnosis Date  . Anxiety   . Depression   . Hyperlipemia   . Hypertension   . Obesity   . Schizo affective schizophrenia ([place])   . Sleep apnea     Past Surgical History:  Procedure Laterality Date  . VASECTOMY     Family History:  Family History  Problem Relation Age of Onset  . Heart attack Mother   . Cancer Mother   . Cancer Sister    Family Psychiatric  History: father hx of bipolar and suicide attempt, paternal grandfather hx of  bipolar. Tobacco Screening: Have you used any form of tobacco in the last 30 days? (Cigarettes, Smokeless Tobacco, Cigars, and/or Pipes): No Social History: Pt was born and raised in [place], and he has been living in [place] for the past 3-4 years. He is homeless for the past 2 months and most recently has been staying with a friend. He completed 12th grade. He is unemployed and receives disability. He has a 16 y/o son who lives with his mother. He denies legal and trauma history. Social History   Substance and Sexual Activity  Alcohol Use No  . Alcohol/week: 0.0 oz     Social History   Substance and Sexual Activity  Drug Use No    Additional Social History:                           Allergies:  No Known Allergies Lab Results:  Results for orders placed or performed during the hospital encounter of 06/02/18 (from the past 48 hour(s))  Comprehensive metabolic panel     Status: Abnormal  Collection Time: 06/02/18 11:05 PM  Result Value Ref Range   Sodium 137 135 - 145 mmol/L   Potassium 3.8 3.5 - 5.1 mmol/L   Chloride 102 101 - 111 mmol/L   CO2 26 22 - 32 mmol/L   Glucose, Bld 107 (H) 65 - 99 mg/dL   BUN 11 6 - 20 mg/dL   Creatinine, Ser 1.45 (H) 0.61 - 1.24 mg/dL   Calcium 9.2 8.9 - 10.3 mg/dL   Total Protein 7.8 6.5 - 8.1 g/dL   Albumin 4.1 3.5 - 5.0 g/dL   AST 30 15 - 41 U/L   ALT 30 17 - 63 U/L   Alkaline Phosphatase 103 38 - 126 U/L   Total Bilirubin 0.4 0.3 - 1.2 mg/dL   GFR calc non Af Amer >60 >60 mL/min   GFR calc Af Amer >60 >60 mL/min    Comment: (NOTE) The eGFR has been calculated using the CKD EPI equation. This calculation has not been validated in all clinical situations. eGFR's persistently <60 mL/min signify possible Chronic Kidney Disease.    Anion gap 9 5 - 15    Comment: Performed at [facility], [place] [address], [place], [place] [address]  Ethanol     Status: None   Collection Time: 06/02/18 11:05 PM  Result  Value Ref Range   Alcohol, Ethyl (B) <10 <10 mg/dL    Comment: (NOTE) Lowest detectable limit for serum alcohol is 10 mg/dL. For medical purposes only. Performed at [facility], [place] [address] [place], [place] [address]   Salicylate level     Status: None   Collection Time: 06/02/18 11:05 PM  Result Value Ref Range   Salicylate Lvl <6.0 2.8 - 30.0 mg/dL    Comment: Performed at [facility], [place] [address], [place], [place] [address]  Acetaminophen level     Status: Abnormal   Collection Time: 06/02/18 11:05 PM  Result Value Ref Range   Acetaminophen (Tylenol), Serum <10 (L) 10 - 30 ug/mL    Comment: (NOTE) Therapeutic concentrations vary significantly. A range of 10-30 ug/mL  may be an effective concentration for many patients. However, some  are best treated at concentrations outside of this range. Acetaminophen concentrations >150 ug/mL at 4 hours after ingestion  and >50 ug/mL at 12 hours after ingestion are often associated with  toxic reactions. Performed at [facility], [place] [address] [place], [place] [address]   cbc     Status: None   Collection Time: 06/02/18 11:05 PM  Result Value Ref Range   WBC 6.6 4.0 - 10.5 K/uL   RBC 4.65 4.22 - 5.81 MIL/uL   Hemoglobin 13.7 13.0 - 17.0 g/dL   HCT 39.1 39.0 - 52.0 %   MCV 84.1 78.0 - 100.0 fL   MCH 29.5 26.0 - 34.0 pg   MCHC 35.0 30.0 - 36.0 g/dL   RDW 13.3 11.5 - 15.5 %   Platelets 251 150 - 400 K/uL    Comment: Performed at [facility], [place] [address] [place], [place] [address]  Rapid urine drug screen (hospital performed)     Status: None   Collection Time: 06/02/18 11:05 PM  Result Value Ref Range   Opiates NONE DETECTED NONE DETECTED   Cocaine NONE DETECTED NONE DETECTED   Benzodiazepines NONE DETECTED NONE DETECTED   Amphetamines NONE DETECTED NONE DETECTED   Tetrahydrocannabinol NONE DETECTED NONE DETECTED   Barbiturates NONE  DETECTED NONE DETECTED  Comment: (NOTE) DRUG SCREEN FOR MEDICAL PURPOSES ONLY.  IF CONFIRMATION IS NEEDED FOR ANY PURPOSE, NOTIFY LAB WITHIN 5 DAYS. LOWEST DETECTABLE LIMITS FOR URINE DRUG SCREEN Drug Class                     Cutoff (ng/mL) Amphetamine and metabolites    1000 Barbiturate and metabolites    200 Benzodiazepine                 062 Tricyclics and metabolites     300 Opiates and metabolites        300 Cocaine and metabolites        300 THC                            50 Performed at [facility], [place] [address], [place], [place] [address]     Blood Alcohol level:  Lab Results  Component Value Date   ETH <10 06/02/2018   ETH <10 31/51/7616    Metabolic Disorder Labs:  Lab Results  Component Value Date   HGBA1C 5.6 01/09/2017   MPG 114 01/09/2017   Lab Results  Component Value Date   PROLACTIN 33.8 (H) 01/09/2017   Lab Results  Component Value Date   CHOL 198 01/09/2017   TRIG 128 01/09/2017   HDL 40 (L) 01/09/2017   CHOLHDL 5.0 01/09/2017   VLDL 26 01/09/2017   LDLCALC 132 (H) 01/09/2017    Current Medications: Current Facility-Administered Medications  Medication Dose Route Frequency Provider Last Rate Last Dose  . acetaminophen (TYLENOL) tablet 650 mg  650 mg Oral Q6H PRN Ethelene Hal, NP      . alum & mag hydroxide-simeth (MAALOX/MYLANTA) 200-200-20 MG/5ML suspension 30 mL  30 mL Oral Q4H PRN Ethelene Hal, NP      . amLODipine (NORVASC) tablet 10 mg  10 mg Oral Daily Ethelene Hal, NP   10 mg at 06/04/18 0751  . ARIPiprazole (ABILIFY) tablet 15 mg  15 mg Oral Daily Maris Berger T, MD      . hydrALAZINE (APRESOLINE) tablet 100 mg  100 mg Oral Daily Ethelene Hal, NP   100 mg at 06/04/18 0751  . hydrochlorothiazide (MICROZIDE) capsule 12.5 mg  12.5 mg Oral Daily Ethelene Hal, NP   12.5 mg at 06/04/18 0751  . hydrOXYzine (ATARAX/VISTARIL) tablet 50 mg  50 mg Oral TID PRN  Ethelene Hal, NP   50 mg at 06/03/18 2124  . magnesium hydroxide (MILK OF MAGNESIA) suspension 30 mL  30 mL Oral Daily PRN Ethelene Hal, NP      . Oxcarbazepine (TRILEPTAL) tablet 300 mg  300 mg Oral BID Ethelene Hal, NP   300 mg at 06/04/18 0752  . traZODone (DESYREL) tablet 100 mg  100 mg Oral QHS PRN Lindon Romp A, NP   100 mg at 06/03/18 2125   PTA Medications: Medications Prior to Admission  Medication Sig Dispense Refill Last Dose  . amLODipine (NORVASC) 10 MG tablet Take 1 tablet (10 mg total) by mouth daily. 30 tablet 1 06/02/2018 at Unknown time  . clonazePAM (KLONOPIN) 2 MG tablet Take 2 mg by mouth 3 (three) times daily.   05/31/2018  . hydrALAZINE (APRESOLINE) 100 MG tablet Take 100 mg by mouth daily.  1 06/02/2018 at Unknown time  . hydrochlorothiazide (MICROZIDE) 12.5 MG capsule Take 1 capsule (12.5 mg total) by mouth daily. [place]  capsule 1 06/02/2018 at Unknown time  . ibuprofen (ADVIL,MOTRIN) 200 MG tablet Take 200 mg by mouth every 6 (six) hours as needed for fever, headache, mild pain, moderate pain or cramping.   06/02/2018 at Unknown time  . Multiple Vitamin (MULTIVITAMIN WITH MINERALS) TABS tablet Take 1 tablet by mouth 2 (two) times daily.   06/02/2018 at Unknown time  . OLANZapine (ZYPREXA) 10 MG tablet Take 10 mg by mouth at bedtime.   2 weeks ago    Musculoskeletal: Strength & Muscle Tone: within normal limits Gait & Station: normal Patient leans: N/A  Psychiatric Specialty Exam: Physical Exam  Nursing note and vitals reviewed.   Review of Systems  Constitutional: Negative for chills and fever.  Respiratory: Negative for cough and shortness of breath.   Cardiovascular: Negative for chest pain.  Gastrointestinal: Negative for abdominal pain, heartburn, nausea and vomiting.  Psychiatric/Behavioral: Positive for depression, hallucinations and suicidal ideas. The patient is nervous/anxious. The patient does not have insomnia.     Blood pressure (!)  165/103, pulse 77, temperature 98.6 F (37 C), temperature source Oral, resp. rate 18, height 6' 2"  (1.88 m), weight (!) 198.7 kg (438 lb).Body mass index is 56.24 kg/m.  General Appearance: Casual and Fairly Groomed  Eye Contact:  Good  Speech:  Clear and Coherent and Normal Rate  Volume:  Normal  Mood:  Anxious and Depressed  Affect:  Appropriate, Congruent and Constricted  Thought Process:  Coherent and Goal Directed  Orientation:  Full (Time, Place, and Person)  Thought Content:  Hallucinations: Auditory  Suicidal Thoughts:  Yes.  with intent/plan  Homicidal Thoughts:  No  Memory:  Immediate;   Fair Recent;   Fair Remote;   Fair  Judgement:  Poor  Insight:  Lacking  Psychomotor Activity:  Normal  Concentration:  Concentration: Fair  Recall:  [employer] of Knowledge:  Fair  Language:  Fair  Akathisia:  No  Handed:    AIMS (if indicated):     Assets:  Desire for Improvement Resilience  ADL's:  Intact  Cognition:  WNL  Sleep:  Number of Hours: 6.75    Treatment Plan Summary: Daily contact with patient to assess and evaluate symptoms and progress in treatment and Medication management  Observation Level/Precautions:  15 minute checks  Laboratory:  CBC Chemistry Profile HbAIC UDS UA  Psychotherapy:  Encourage participation in groups and therapeutic milieu   Medications:  DC zyprexa. Continue trileptal 340m po BID. Start abilify 145mpo qDay. Continue norvasc 1067mo Day. Continue hydralazine 100m39m qDay. Continue HCTZ 12.5mg 28mqDay.   Consultations:    Discharge Concerns:    Estimated LOS: 5-7 days  Other:     Physician Treatment Plan for Primary Diagnosis: Schizoaffective disorder, bipolar type (HCC) [place] Term Goal(s): Improvement in symptoms so as ready for discharge  Short Term Goals: Ability to identify and develop effective coping behaviors will improve  Physician Treatment Plan for Secondary Diagnosis: Principal Problem:   Schizoaffective disorder,  bipolar type (HCC) [place] Term Goal(s): Improvement in symptoms so as ready for discharge  Short Term Goals: Ability to maintain clinical measurements within normal limits will improve  I certify that inpatient services furnished can reasonably be expected to improve the patient's condition.    ChrisPennelope Bracken6/11/20193:29 PM

## 2018-06-05 DIAGNOSIS — G259 Extrapyramidal and movement disorder, unspecified: Secondary | ICD-10-CM

## 2018-06-05 DIAGNOSIS — Z9114 Patient's other noncompliance with medication regimen: Secondary | ICD-10-CM

## 2018-06-05 DIAGNOSIS — F29 Unspecified psychosis not due to a substance or known physiological condition: Secondary | ICD-10-CM

## 2018-06-05 DIAGNOSIS — I10 Essential (primary) hypertension: Secondary | ICD-10-CM

## 2018-06-05 DIAGNOSIS — R451 Restlessness and agitation: Secondary | ICD-10-CM

## 2018-06-05 MED ORDER — HALOPERIDOL LACTATE 5 MG/ML IJ SOLN: 5.0000 mg | Freq: Three times a day (TID) | INTRAMUSCULAR | Status: DC | PRN

## 2018-06-05 MED ORDER — BENZTROPINE MESYLATE 1 MG PO TABS: 1.0000 mg | ORAL_TABLET | Freq: Two times a day (BID) | ORAL | Status: DC | PRN

## 2018-06-05 MED ORDER — HALOPERIDOL 5 MG PO TABS
5.0000 mg | ORAL_TABLET | Freq: Three times a day (TID) | ORAL | Status: DC | PRN
  Administered 2018-06-10: 5 mg via ORAL
  Filled 2018-06-05 (×2): qty 1

## 2018-06-05 MED ORDER — BENZTROPINE MESYLATE 1 MG/ML IJ SOLN: 1.0000 mg | Freq: Two times a day (BID) | INTRAMUSCULAR | Status: DC | PRN

## 2018-06-05 MED ORDER — HALOPERIDOL 5 MG PO TABS
5.0000 mg | ORAL_TABLET | Freq: Two times a day (BID) | ORAL | Status: DC
  Administered 2018-06-05 – 2018-06-06 (×3): 5 mg via ORAL
  Filled 2018-06-05 (×7): qty 1

## 2018-06-05 NOTE — Progress Notes (Signed)
Pt stated he gets [place] shot

## 2018-06-05 NOTE — Progress Notes (Signed)
[facility]  06/05/2018 11:16 AM Alwyn RenMichael Towle V.  MRN:  [id number] Subjective:    Allen ManMichael Friese is a 37 y/o M with history of schizoaffective disorder bipolar type who was admitted voluntarily from [facility] with worsening symptoms of depression, SI with plan to lie in the road, and worsening AH. Pt was medically cleared [facility] and [facility]. Pt had been receiving [place] Sustenna injections as an outpatient, but he did not feel that they were helpful for his symptoms, so he was transitioned to regimen of trileptal and abilify.  Today upon evaluation, pt shares, "I'm doing bad, doc. I'm worrying. I'm stressing. I'm paranoid. I feel like people are watching me." Pt refused his AM medications today, citing that he feels he has been taking too many medications. Discussed with patient that we are attempting to adjust his medications in the hospital to address his symptoms, and pt verbalized good understanding. He endorses SI without specific plan, but he is able to contract for safety. He denies HI. He endorses AH of hearing multiple voices which are not command in nature. He endorses VH of seeing "people in the corner of my eye." Pt denies physical complaints. He is tolerating his current regimen without side effects. Discussed with patient about treatment options. Reviewed pt's previous medication trials and he shares that he had previously done well on haldol for a period of 5 years, but his outpatient physician changed his regimen. He does not recall having side effects, and he is unsure why it changed. Pt is in agreement to discontinue abilify and start trial of haldol. He agrees to continue on trileptal. Pt was in agreement with the above plan, and he had no further questions, comments, or concerns.   Principal Problem: Schizoaffective disorder, bipolar type (HCC) Diagnosis:   Patient Active Problem List   Diagnosis Date Noted  . Auditory  hallucinations [R44.0]   . Depression [F32.9]   . Suicidal ideation [R45.851]   . Hypertension [I10] 01/09/2017  . Obesity [E66.9] 01/09/2017  . Schizoaffective disorder, bipolar type (HCC) [F25.0] 01/07/2017  . 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 Abdominal pain [R10.9] 11/20/2014  . Abdominal pain in male [R10.9] 11/18/2014   Total Time spent with patient: 30 minutes  Past Psychiatric History: see H&P  Past Medical History:  Past Medical History:  Diagnosis Date  . Anxiety   . Depression   . Hyperlipemia   . Hypertension   . Obesity   . Schizo affective schizophrenia (HCC)   . Sleep apnea     Past Surgical History:  Procedure Laterality Date  . VASECTOMY     Family History:  Family History  Problem Relation Age of Onset  . Heart attack Mother   . Cancer Mother   . Cancer Sister    Family Psychiatric  History: see H&P Social History:  Social History   Substance and Sexual Activity  Alcohol Use No  . Alcohol/week: 0.0 oz     Social History   Substance and Sexual Activity  Drug Use No    Social History   Socioeconomic History  . Marital status: Married    Spouse name: Not on file  . Number of children: 1  . Years of education: 8111  . Highest education level: Not on file  Occupational History  . Occupation: [employer]  Social Needs  . Financial resource strain: Not on file  . Food insecurity:  Worry: Not on file    Inability: Not on file  . Transportation needs:    Medical: Not on file    Non-medical: Not on file  Tobacco Use  . Smoking status: Never Smoker  . Smokeless tobacco: Never Used  Substance and Sexual Activity  . Alcohol use: No    Alcohol/week: 0.0 oz  . Drug use: No  . Sexual activity: Yes    Birth control/protection: None  Lifestyle  . Physical activity:    Days per week: Not on file    Minutes per session: Not on file  . Stress: Not on file  Relationships  . Social  connections:    Talks on phone: Not on file    Gets together: Not on file    Attends religious service: Not on file    Active member of club or organization: Not on file    Attends meetings of clubs or organizations: Not on file    Relationship status: Not on file  Other Topics Concern  . Not on file  Social History Narrative   Drinks caffeine daily    Additional Social History:                         Sleep: Fair  Appetite:  Good  Current Medications: Current Facility-Administered Medications  Medication Dose Route Frequency Provider Last Rate Last Dose  . acetaminophen (TYLENOL) tablet 650 mg  650 mg Oral Q6H PRN Laveda Abbe, NP      . alum & mag hydroxide-simeth (MAALOX/MYLANTA) 200-200-20 MG/5ML suspension 30 mL  30 mL Oral Q4H PRN Laveda Abbe, NP      . amLODipine (NORVASC) tablet 10 mg  10 mg Oral Daily Laveda Abbe, NP   10 mg at 06/05/18 0640  . ARIPiprazole (ABILIFY) tablet 15 mg  15 mg Oral Daily Micheal Likens, MD   15 mg at 06/04/18 1622  . hydrALAZINE (APRESOLINE) tablet 100 mg  100 mg Oral Daily Laveda Abbe, NP   100 mg at 06/05/18 0640  . hydrochlorothiazide (MICROZIDE) capsule 12.5 mg  12.5 mg Oral Daily Laveda Abbe, NP   12.5 mg at 06/05/18 0640  . hydrOXYzine (ATARAX/VISTARIL) tablet 50 mg  50 mg Oral TID PRN Laveda Abbe, NP   50 mg at 06/04/18 1910  . magnesium hydroxide (MILK OF MAGNESIA) suspension 30 mL  30 mL Oral Daily PRN Laveda Abbe, NP      . Oxcarbazepine (TRILEPTAL) tablet 300 mg  300 mg Oral BID Laveda Abbe, NP   300 mg at 06/04/18 1622  . traZODone (DESYREL) tablet 100 mg  100 mg Oral QHS PRN Nira Conn A, NP   100 mg at 06/03/18 2125  . traZODone (DESYREL) tablet 200 mg  200 mg Oral Once Jackelyn Poling, NP        Lab Results: No results found for this or any previous visit (from the past 48 hour(s)).  Blood Alcohol level:  Lab Results  Component  Value Date   ETH <10 06/02/2018   ETH <10 05/31/2018    Metabolic Disorder Labs: Lab Results  Component Value Date   HGBA1C 5.6 01/09/2017   MPG 114 01/09/2017   Lab Results  Component Value Date   PROLACTIN 33.8 (H) 01/09/2017   Lab Results  Component Value Date   CHOL 198 01/09/2017   TRIG 128 01/09/2017   HDL 40 (L) 01/09/2017   CHOLHDL 5.0  01/09/2017   VLDL 26 01/09/2017   LDLCALC 132 (H) 01/09/2017    Physical Findings: AIMS: Facial and Oral Movements Muscles of Facial Expression: None, normal Lips and Perioral Area: None, normal Jaw: None, normal Tongue: None, normal,Extremity Movements Upper (arms, wrists, hands, fingers): None, normal Lower (legs, knees, ankles, toes): None, normal, Trunk Movements Neck, shoulders, hips: None, normal, Overall Severity Severity of abnormal movements (highest score from questions above): None, normal Incapacitation due to abnormal movements: None, normal Patient's awareness of abnormal movements (rate only patient's report): No Awareness, Dental Status Current problems with teeth and/or dentures?: No Does patient usually wear dentures?: No  CIWA:    COWS:     Musculoskeletal: Strength & Muscle Tone: within normal limits Gait & Station: normal Patient leans: N/A  Psychiatric Specialty Exam: Physical Exam  Nursing note and vitals reviewed.   Review of Systems  Constitutional: Negative for chills and fever.  Respiratory: Negative for cough and shortness of breath.   Cardiovascular: Negative for chest pain.  Gastrointestinal: Negative for abdominal pain, heartburn, nausea and vomiting.  Psychiatric/Behavioral: Positive for depression, hallucinations and suicidal ideas. The patient is nervous/anxious. The patient does not have insomnia.     Blood pressure 116/88, pulse (!) 119, temperature 98.6 F (37 C), temperature source Oral, resp. rate 18, height 6\' 2"  (1.88 m), weight (!) 198.7 kg (438 lb).Body mass index is 56.24  kg/m.  General Appearance: Casual and Disheveled  Eye Contact:  Good  Speech:  Clear and Coherent and Normal Rate  Volume:  Normal  Mood:  Anxious and Depressed  Affect:  Blunt, Congruent and Constricted  Thought Process:  Coherent and Goal Directed  Orientation:  Full (Time, Place, and Person)  Thought Content:  Hallucinations: Auditory Visual and Paranoid Ideation  Suicidal Thoughts:  Yes.  without intent/plan  Homicidal Thoughts:  No  Memory:  Immediate;   Fair Recent;   Fair Remote;   Fair  Judgement:  Poor  Insight:  Lacking  Psychomotor Activity:  Normal  Concentration:  Concentration: Fair  Recall:  [employer] of Knowledge:  Fair  Language:  Fair  Akathisia:  No  Handed:    AIMS (if indicated):     Assets:  Communication Skills Desire for Improvement Resilience Social Support  ADL's:  Intact  Cognition:  WNL  Sleep:  Number of Hours: 5   Treatment Plan Summary: Daily contact with patient to assess and evaluate symptoms and progress in treatment and Medication management   -Continue inpatient hospitalization  -Schizoaffective disorder, bipolar type   -DC abilify  -Start haldol 5mg  po BID   -Continue trileptal 300mg  po BID  - HTN   -Continue norvasc 10mg  po qDay  -Continue hydralazine 100mg  po qDay   -Continue HCTZ 12.5mg  po qDay  -EPS   -Start cogentin 1mg  po/IM q12h prn EPS  -Agitation/psychosis  -Start haldol 5mg  po/IM q8h prn psychosis/agitation  -Anxiety  -Continue vistaril 50mg  po q8h prn anxiety  -Insomnia   -Continue trazodone 100mg  po qhs prn insomnia  -Encourage participation in groups and therapeutic milieu  -Disposition planning will be ongoing  Micheal Likens, MD 06/05/2018, 11:16 AM

## 2018-06-05 NOTE — Progress Notes (Signed)
Recreation Therapy Notes  INPATIENT RECREATION THERAPY ASSESSMENT  Patient Details Name: Allen RenMichael Mcmackin V. MRN: [id number] DOB: November 17, 1981 Today's Date: 06/05/2018       Information Obtained From: Patient  Able to Participate in Assessment/Interview: Yes  Patient Presentation: Alert, Oriented  Reason for Admission (Per Patient): Suicidal Ideation, Other (Comments)(Paranoid)  Patient Stressors: Other (Comment)(Living situation; medication)  Coping Skills:   Isolation, Self-Injury, Journal, Sports, TV, Music, Exercise, Meditate, Impulsivity, Talk, Prayer, Avoidance, Read  Leisure Interests (2+):  Music - Listen, Individual - Other (Comment)(Listen to people talk)  Frequency of Recreation/Participation: Other (Comment)(Daily)  Awareness of Community Resources:  Yes  Community Resources:  [place], [place]  Current Use: No  If no, Barriers?: Transportation  Expressed Interest in [employer]Community Resource Information: Yes  County of Residence:  [place]  Patient Main Form of Transportation: Therapist, musicublic Transportation  Patient Strengths:  Caring  Patient Identified Areas of Improvement:  Self Destruction  Patient Goal for Hospitalization:  "Get stable on medications; get stable"  Current SI (including self-harm):  No  Current HI:  No  Current AVH: Yes(Pt rated it a 10 out of 10.)  Staff Intervention Plan: Collaborate with Interdisciplinary Treatment Team, Group Attendance  Consent to Intern Participation: N/A    Caroll RancherMarjette Seham [place], LRT/CTRS  Caroll RancherLindsay, Nasim Habeeb A 06/05/2018, 3:41 PM

## 2018-06-05 NOTE — Tx Team (Signed)
Interdisciplinary Treatment and Diagnostic Plan Update  06/05/2018 Time of Session: 8:39 AM  Allen Sosa MRN: [id number]  Principal Diagnosis: Schizoaffective disorder, bipolar type ([place])  Secondary Diagnoses: Principal Problem:   Schizoaffective disorder, bipolar type ([place])   Current Medications:  Current Facility-Administered Medications  Medication Dose Route Frequency Provider Last Rate Last Dose  . acetaminophen (TYLENOL) tablet 650 mg  650 mg Oral Q6H PRN Ethelene Hal, NP      . alum & mag hydroxide-simeth (MAALOX/MYLANTA) 200-200-20 MG/5ML suspension 30 mL  30 mL Oral Q4H PRN Ethelene Hal, NP      . amLODipine (NORVASC) tablet 10 mg  10 mg Oral Daily Ethelene Hal, NP   10 mg at 06/05/18 0640  . ARIPiprazole (ABILIFY) tablet 15 mg  15 mg Oral Daily Pennelope Bracken, MD   15 mg at 06/04/18 1622  . hydrALAZINE (APRESOLINE) tablet 100 mg  100 mg Oral Daily Ethelene Hal, NP   100 mg at 06/05/18 0640  . hydrochlorothiazide (MICROZIDE) capsule 12.5 mg  12.5 mg Oral Daily Ethelene Hal, NP   12.5 mg at 06/05/18 0640  . hydrOXYzine (ATARAX/VISTARIL) tablet 50 mg  50 mg Oral TID PRN Ethelene Hal, NP   50 mg at 06/04/18 1910  . magnesium hydroxide (MILK OF MAGNESIA) suspension 30 mL  30 mL Oral Daily PRN Ethelene Hal, NP      . Oxcarbazepine (TRILEPTAL) tablet 300 mg  300 mg Oral BID Ethelene Hal, NP   300 mg at 06/04/18 1622  . traZODone (DESYREL) tablet 100 mg  100 mg Oral QHS PRN Lindon Romp A, NP   100 mg at 06/03/18 2125  . traZODone (DESYREL) tablet 200 mg  200 mg Oral Once Lindon Romp A, NP        PTA Medications: Medications Prior to Admission  Medication Sig Dispense Refill Last Dose  . amLODipine (NORVASC) 10 MG tablet Take 1 tablet (10 mg total) by mouth daily. 30 tablet 1 06/02/2018 at Unknown time  . clonazePAM (KLONOPIN) 2 MG tablet Take 2 mg by mouth 3 (three) times daily.   05/31/2018  .  hydrALAZINE (APRESOLINE) 100 MG tablet Take 100 mg by mouth daily.  1 06/02/2018 at Unknown time  . hydrochlorothiazide (MICROZIDE) 12.5 MG capsule Take 1 capsule (12.5 mg total) by mouth daily. 30 capsule 1 06/02/2018 at Unknown time  . ibuprofen (ADVIL,MOTRIN) 200 MG tablet Take 200 mg by mouth every 6 (six) hours as needed for fever, headache, mild pain, moderate pain or cramping.   06/02/2018 at Unknown time  . Multiple Vitamin (MULTIVITAMIN WITH MINERALS) TABS tablet Take 1 tablet by mouth 2 (two) times daily.   06/02/2018 at Unknown time  . OLANZapine (ZYPREXA) 10 MG tablet Take 10 mg by mouth at bedtime.   2 weeks ago    Patient Stressors: Financial difficulties Marital or family conflict Medication change or noncompliance Occupational concerns  Patient Strengths: Ability for insight Average or above average intelligence Capable of independent living General fund of knowledge  Treatment Modalities: Medication Management, Group therapy, Case management,  1 to 1 session with clinician, Psychoeducation, Recreational therapy.   Physician Treatment Plan for Primary Diagnosis: Schizoaffective disorder, bipolar type ([place]) Long Term Goal(s): Improvement in symptoms so as ready for discharge  Short Term Goals: Ability to identify and develop effective coping behaviors will improve Ability to maintain clinical measurements within normal limits will improve  Medication Management: Evaluate patient's response, side effects, and  tolerance of medication regimen.  Therapeutic Interventions: 1 to 1 sessions, Unit Group sessions and Medication administration.  Evaluation of Outcomes: Progressing  Physician Treatment Plan for Secondary Diagnosis: Principal Problem:   Schizoaffective disorder, bipolar type ([place])   Long Term Goal(s): Improvement in symptoms so as ready for discharge  Short Term Goals: Ability to identify and develop effective coping behaviors will improve Ability to maintain  clinical measurements within normal limits will improve  Medication Management: Evaluate patient's response, side effects, and tolerance of medication regimen.  Therapeutic Interventions: 1 to 1 sessions, Unit Group sessions and Medication administration.  Evaluation of Outcomes: Progressing   RN Treatment Plan for Primary Diagnosis: Schizoaffective disorder, bipolar type ([place]) Long Term Goal(s): Knowledge of disease and therapeutic regimen to maintain health will improve  Short Term Goals: Ability to identify and develop effective coping behaviors will improve and Compliance with prescribed medications will improve  Medication Management: RN will administer medications as ordered by provider, will assess and evaluate patient's response and provide education to patient for prescribed medication. RN will report any adverse and/or side effects to prescribing provider.  Therapeutic Interventions: 1 on 1 counseling sessions, Psychoeducation, Medication administration, Evaluate responses to treatment, Monitor vital signs and CBGs as ordered, Perform/monitor CIWA, COWS, AIMS and Fall Risk screenings as ordered, Perform wound care treatments as ordered.  Evaluation of Outcomes: Progressing   LCSW Treatment Plan for Primary Diagnosis: Schizoaffective disorder, bipolar type ([place]) Long Term Goal(s): Safe transition to appropriate next level of care at discharge, Engage patient in therapeutic group addressing interpersonal concerns.  Short Term Goals: Engage patient in aftercare planning with referrals and resources  Therapeutic Interventions: Assess for all discharge needs, 1 to 1 time with Social worker, Explore available resources and support systems, Assess for adequacy in community support network, Educate family and significant other(s) on suicide prevention, Complete Psychosocial Assessment, Interpersonal group therapy.  Evaluation of Outcomes: Met  D/C to shelter, follow up [facility], [facility]   Progress in Treatment: Attending groups:Intermittently Participating in groups: Minimally Taking medication as prescribed: Yes Toleration medication: Yes, no side effects reported at this time Family/Significant other contact made: No Patient understands diagnosis: Yes AEB asking for help with multiple mental health symptoms Discussing patient identified problems/goals with staff: Yes Medical problems stabilized or resolved: Yes Denies suicidal/homicidal ideation: Yes Issues/concerns per patient self-inventory: None Other: N/A  New problem(s) identified: None identified at this time.   New Short Term/Long Term Goal(s): "I want to be stable on my medications, and then find resources to help me with being homeless."   Discharge Plan or Barriers:   Reason for Continuation of Hospitalization: Anxiety Hallucinations  Medication stabilization   Estimated Length of Stay: 6/17  Attendees: Patient: Allen Sosa 06/05/2018  8:39 AM  Physician: Maris Berger, MD 06/05/2018  8:39 AM  Nursing: Sena Hitch, RN 06/05/2018  8:39 AM  RN Care Manager: Lars Pinks, RN 06/05/2018  8:39 AM  Social Worker: Ripley Fraise 06/05/2018  8:39 AM  Recreational Therapist: Winfield Cunas 06/05/2018  8:39 AM  Other: Norberto Sorenson 06/05/2018  8:39 AM  Other:  06/05/2018  8:39 AM    Scribe for Treatment Team:  Roque Lias LCSW 06/05/2018 8:39 AM

## 2018-06-05 NOTE — [facility] Group Notes (Signed)
LCSW Group Therapy Note   06/05/2018 1:15pm   Type of Therapy and Topic:  Group Therapy:  Overcoming Obstacles   Participation Level:  Minimal   Description of Group:    In this group patients will be encouraged to explore what they see as obstacles to their own wellness and recovery. They will be guided to discuss their thoughts, feelings, and behaviors related to these obstacles. The group will process together ways to cope with barriers, with attention given to specific choices patients can make. Each patient will be challenged to identify changes they are motivated to make in order to overcome their obstacles. This group will be process-oriented, with patients participating in exploration of their own experiences as well as giving and receiving support and challenge from other group members.   Therapeutic Goals: 1. Patient will identify personal and current obstacles as they relate to admission. 2. Patient will identify barriers that currently interfere with their wellness or overcoming obstacles.  3. Patient will identify feelings, thought process and behaviors related to these barriers. 4. Patient will identify two changes they are willing to make to overcome these obstacles:      Summary of Patient Progress   Stayed the entire time, engaged throughout.  Talked about how he always stops his meds when he is feeling better, and he wants to learn from this experience.  "Since my wife left, its been a wake up call.  I know I can do better."   Therapeutic Modalities:   Cognitive Behavioral Therapy Solution Focused Therapy Motivational Interviewing Relapse Prevention Therapy  Ida RogueRodney B Chele Cornell, LCSW 06/05/2018 3:37 PM

## 2018-06-05 NOTE — Progress Notes (Signed)
Recreation Therapy Notes  Date: 6.12.19 Time: 1000 Location: 500 [address] Dayroom  Group Topic: Communication, Team Building, Problem Solving  Goal Area(s) Addresses:  Patient will effectively work with peer towards shared goal.  Patient will identify skill used to make activity successful.  Patient will identify how skills used during activity can be used to reach post d/c goals.   Behavioral Response: Minimal  Intervention: STEM Activity   Activity: In team's, using 20 plastic straws and a long piece of masking tape, patients were to construct a stand alone bridge that could hold the weight of a small puzzle box.   Education: Pharmacist, communityocial Skills, Building control surveyorDischarge Planning.   Education Outcome: Acknowledges education/In group clarification offered/Needs additional education.   Clinical Observations/Feedback: Pt was pleasant and social with peers.  Pt did assist in completing activity when prompted.   Caroll RancherMarjette Sophie Quiles, LRT/CTRS         Lillia AbedLindsay, Eliot Popper A 06/05/2018 1:05 PM

## 2018-06-05 NOTE — Progress Notes (Signed)
The focus of this group is to help patients review their daily goal of treatment and discuss progress on daily workbooks. Pt attended the evening group session and responded to all discussion prompts from the Writer. Pt shared that today was a good day on the unit, the highlight of which was "everybody helping everyone else out. We're all getting along in here."  Pt told that his goal this week was to repair his relationship with his wife. "We've talked a few times, but I've got some work to do there. I hope I can make it right."  Pt reported having no additional needs from Nursing Staff this evening and his affect was appropriate.

## 2018-06-05 NOTE — Progress Notes (Signed)
Patient states that he has been having suicidal thoughts with no plan.  Patient has been compliant with all medications, attended groups and engaged in unit activities.  Patient reported feeling increasingly depressed but stated he would be able to maintain safety in the hospital.   Assess patient for safety, offer medications as prescribed, engage patient in 1:1 staff talks.   Patient able to contract for safety.  Continue to monitor as planned.

## 2018-06-05 NOTE — Plan of Care (Signed)
D: Pt endorses AVH , denies SI/HI pt stated the hallucinations are getting better. Pt seen on the unit talking with peers and appeared to have brighter affect than yesterday. Pt is pleasant and cooperative.   A: Pt was offered support and encouragement. Pt was given scheduled medications. Pt was encourage to attend groups. Q 15 minute checks were done for safety.   R:Pt attends groups and interacts well with peers and staff. Pt is taking medication. Pt receptive to treatment and safety maintained on unit.   Problem: Education: Goal: Emotional status will improve Outcome: Progressing   Problem: Education: Goal: Mental status will improve Outcome: Progressing   Problem: Activity: Goal: Sleeping patterns will improve Outcome: Progressing   Problem: Safety: Goal: Periods of time without injury will increase Outcome: Progressing   Problem: Education: Goal: Knowledge of the prescribed therapeutic regimen will improve Outcome: Progressing   Problem: Activity: Goal: Interest or engagement in leisure activities will improve Outcome: Progressing   Problem: Activity: Goal: Imbalance in normal sleep/wake cycle will improve Outcome: Progressing

## 2018-06-05 NOTE — Progress Notes (Signed)
D: Pt denies SI/HI/VH. Pt endorses AH, pt stated he wanted to continue to use the Zyprexa. Pt was encouraged to let the doctor know because he D/C'd the Zyprexa do to pt telling he did not want it anymore. Pt appeared afraid , pt seemed to be responding to internal stimuli. Pt was afraid to take the Trazodone due to him feeling like it would be hard for him to wake up. Pt was encouraged to try the 100 mg to see and if that was too much the doctor could take it back to 50 mg.   A: Pt was offered support and encouragement. Pt was given scheduled medications. Pt was encourage to attend groups. Q 15 minute checks were done for safety.   R:Pt attends groups and interacts well with peers and staff.  Pt has no complaints.Pt receptive to treatment and safety maintained on unit.

## 2018-06-06 ENCOUNTER — Other Ambulatory Visit: Payer: Self-pay

## 2018-06-06 MED ORDER — HALOPERIDOL 5 MG PO TABS
5.0000 mg | ORAL_TABLET | ORAL | Status: DC
  Administered 2018-06-07: 5 mg via ORAL
  Filled 2018-06-06 (×3): qty 1

## 2018-06-06 MED ORDER — HALOPERIDOL 5 MG PO TABS
10.0000 mg | ORAL_TABLET | Freq: Every day | ORAL | Status: DC
  Filled 2018-06-06 (×3): qty 2

## 2018-06-06 MED ORDER — LORAZEPAM 1 MG PO TABS: 2.0000 mg | ORAL_TABLET | Freq: Four times a day (QID) | ORAL | Status: DC | PRN

## 2018-06-06 MED ORDER — LORAZEPAM 1 MG PO TABS: 1.0000 mg | ORAL_TABLET | Freq: Four times a day (QID) | ORAL | Status: DC | PRN

## 2018-06-06 MED ORDER — LORAZEPAM 2 MG/ML IJ SOLN: 2.0000 mg | Freq: Four times a day (QID) | INTRAMUSCULAR | Status: DC | PRN

## 2018-06-06 NOTE — Plan of Care (Signed)
D: Pt denies SI/HI/VH. Pt is pleasant and cooperative. Pt stated he was doing better. Pt visible on the unit this evening  A: Pt was offered support and encouragement. Pt was given scheduled medications. Pt was encourage to attend groups. Q 15 minute checks were done for safety.   R:Pt attends groups and interacts well with peers and staff. Pt is taking medication. Pt has no complaints.Pt receptive to treatment and safety maintained on unit.   Problem: Education: Goal: Emotional status will improve Outcome: Progressing   Problem: Education: Goal: Mental status will improve Outcome: Progressing   Problem: Activity: Goal: Sleeping patterns will improve Outcome: Progressing

## 2018-06-06 NOTE — [facility] Suicide Risk Assessment (Signed)
[facility] INPATIENT:  Family/Significant Other Suicide Prevention Education  Suicide Prevention Education:  Patient Refusal for Family/Significant Other Suicide Prevention Education: The patient Allen RenMichael Papillion V. has refused to provide written consent for family/significant other to be provided Family/Significant Other Suicide Prevention Education during admission and/or prior to discharge.  Physician notified.  Ida RogueRodney B Coner Gibbard 06/06/2018, 1:42 PM

## 2018-06-06 NOTE — Progress Notes (Signed)
[facility] MD Progress Note  06/06/2018 11:48 AM Allen Sosa  MRN:  [id number] Subjective:    Allen Sosa is a 37 y/o M with history of schizoaffective disorder bipolar type who was admitted voluntarily from [facility] with worsening symptoms of depression, SI with plan to lie in the road, and worsening AH. Pt was medically cleared and then transferred to [facility] for additional treatment and stabilization. Pt had been receiving [place] injections as an outpatient, but he did not feel that they were helpful for his symptoms, so he was transitioned to regimen of trileptal and abilify. Pt reported ongoing symptoms of AH, paranoia, and anxiety, and he shared previous efficacy for 5 years while on haldol, so he was changed from abilify to haldol.  Today upon evaluation, pt shares, "I'm trying to get straight. I've been having paranoia. I felt like I couldn't breathe, but I'm doing a little better now." Pt had reported anxiety, AH, and paranoia to RN staff earlier in AM, and he was given PRN of haldol and ativan, which he reports has been helpful in alleviating acute symptoms. In regards to [facility], pt shares, "They are getting lower now." They are not command in nature. Pt continues to endorse VH of "jesters" which he explains as "people doing stuff behind my back." He denies SI/HI. Discussed with patient about increasing scheduled dose of haldol at bedtime to help alleviate psychotic symptoms, and pt was in agreement. He had no further questions, comments, or concerns.  Principal Problem: Schizoaffective disorder, bipolar type (HCC) Diagnosis:   Patient Active Problem List   Diagnosis Date Noted  . Auditory hallucinations [R44.0]   . Depression [F32.9]   . Suicidal ideation [R45.851]   . Hypertension [I10] 01/09/2017  . Obesity [E66.9] 01/09/2017  . Schizoaffective disorder, bipolar type (HCC) [F25.0] 01/07/2017  . 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  Abdominal pain [R10.9] 11/20/2014  . Abdominal pain in male [R10.9] 11/18/2014   Total Time spent with patient: 30 minutes  Past Psychiatric History: see H&P  Past Medical History:  Past Medical History:  Diagnosis Date  . Anxiety   . Depression   . Hyperlipemia   . Hypertension   . Obesity   . Schizo affective schizophrenia (HCC)   . Sleep apnea     Past Surgical History:  Procedure Laterality Date  . VASECTOMY     Family History:  Family History  Problem Relation Age of Onset  . Heart attack Mother   . Cancer Mother   . Cancer Sister    Family Psychiatric  History: see H&P Social History:  Social History   Substance and Sexual Activity  Alcohol Use No  . Alcohol/week: 0.0 oz     Social History   Substance and Sexual Activity  Drug Use No    Social History   Socioeconomic History  . Marital status: Married    Spouse name: Not on file  . Number of children: 1  . Years of education: 5  . Highest education level: Not on file  Occupational History  . Occupation: [employer]  Social Needs  . Financial resource strain: Not on file  . Food insecurity:    Worry: Not on file    Inability: Not on file  . Transportation needs:    Medical: Not on file    Non-medical: Not on file  Tobacco Use  . Smoking status: Never Smoker  . Smokeless tobacco: Never Used  Substance and Sexual  Activity  . Alcohol use: No    Alcohol/week: 0.0 oz  . Drug use: No  . Sexual activity: Yes    Birth control/protection: None  Lifestyle  . Physical activity:    Days per week: Not on file    Minutes per session: Not on file  . Stress: Not on file  Relationships  . Social connections:    Talks on phone: Not on file    Gets together: Not on file    Attends religious service: Not on file    Active member of club or organization: Not on file    Attends meetings of clubs or organizations: Not on file    Relationship status: Not on file  Other Topics Concern  . Not on file  Social  History Narrative   Drinks caffeine daily    Additional Social History:                         Sleep: Good  Appetite:  Good  Current Medications: Current Facility-Administered Medications  Medication Dose Route Frequency Provider Last Rate Last Dose  . acetaminophen (TYLENOL) tablet 650 mg  650 mg Oral Q6H PRN Laveda Abbe, NP   650 mg at 06/05/18 1722  . alum & mag hydroxide-simeth (MAALOX/MYLANTA) 200-200-20 MG/5ML suspension 30 mL  30 mL Oral Q4H PRN Laveda Abbe, NP      . amLODipine (NORVASC) tablet 10 mg  10 mg Oral Daily Laveda Abbe, NP   10 mg at 06/06/18 0800  . benztropine (COGENTIN) tablet 1 mg  1 mg Oral BID PRN Micheal Likens, MD       Or  . benztropine mesylate (COGENTIN) injection 1 mg  1 mg Intramuscular BID PRN Micheal Likens, MD      . Melene Muller ON 06/07/2018] haloperidol (HALDOL) tablet 5 mg  5 mg Oral BH-q7a Micheal Likens, MD       And  . haloperidol (HALDOL) tablet 10 mg  10 mg Oral QHS Micheal Likens, MD      . haloperidol (HALDOL) tablet 5 mg  5 mg Oral Q8H PRN Micheal Likens, MD       Or  . haloperidol lactate (HALDOL) injection 5 mg  5 mg Intramuscular Q8H PRN Micheal Likens, MD      . hydrALAZINE (APRESOLINE) tablet 100 mg  100 mg Oral Daily Laveda Abbe, NP   100 mg at 06/06/18 0759  . hydrochlorothiazide (MICROZIDE) capsule 12.5 mg  12.5 mg Oral Daily Laveda Abbe, NP   12.5 mg at 06/06/18 0759  . hydrOXYzine (ATARAX/VISTARIL) tablet 50 mg  50 mg Oral TID PRN Laveda Abbe, NP   50 mg at 06/05/18 2302  . LORazepam (ATIVAN) tablet 2 mg  2 mg Oral Q6H PRN Micheal Likens, MD       Or  . LORazepam (ATIVAN) injection 2 mg  2 mg Intramuscular Q6H PRN Micheal Likens, MD      . magnesium hydroxide (MILK OF MAGNESIA) suspension 30 mL  30 mL Oral Daily PRN Laveda Abbe, NP      . Oxcarbazepine (TRILEPTAL) tablet 300 mg   300 mg Oral BID Laveda Abbe, NP   300 mg at 06/06/18 0759  . traZODone (DESYREL) tablet 100 mg  100 mg Oral QHS PRN Nira Conn A, NP   100 mg at 06/03/18 2125    Lab Results: No results found for  this or any previous visit (from the past 48 hour(s)).  Blood Alcohol level:  Lab Results  Component Value Date   ETH <10 06/02/2018   ETH <10 05/31/2018    Metabolic Disorder Labs: Lab Results  Component Value Date   HGBA1C 5.6 01/09/2017   MPG 114 01/09/2017   Lab Results  Component Value Date   PROLACTIN 33.8 (H) 01/09/2017   Lab Results  Component Value Date   CHOL 198 01/09/2017   TRIG 128 01/09/2017   HDL 40 (L) 01/09/2017   CHOLHDL 5.0 01/09/2017   VLDL 26 01/09/2017   LDLCALC 132 (H) 01/09/2017    Physical Findings: AIMS: Facial and Oral Movements Muscles of Facial Expression: None, normal Lips and Perioral Area: None, normal Jaw: None, normal Tongue: None, normal,Extremity Movements Upper (arms, wrists, hands, fingers): None, normal Lower (legs, knees, ankles, toes): None, normal, Trunk Movements Neck, shoulders, hips: None, normal, Overall Severity Severity of abnormal movements (highest score from questions above): None, normal Incapacitation due to abnormal movements: None, normal Patient's awareness of abnormal movements (rate only patient's report): No Awareness, Dental Status Current problems with teeth and/or dentures?: No Does patient usually wear dentures?: No  CIWA:    COWS:     Musculoskeletal: Strength & Muscle Tone: within normal limits Gait & Station: normal Patient leans: N/A  Psychiatric Specialty Exam: Physical Exam  Nursing note and vitals reviewed.   Review of Systems  Constitutional: Negative for chills and fever.  Respiratory: Negative for cough and shortness of breath.   Cardiovascular: Negative for chest pain.  Gastrointestinal: Negative for abdominal pain, heartburn, nausea and vomiting.  Psychiatric/Behavioral:  Positive for hallucinations. Negative for depression and suicidal ideas. The patient is nervous/anxious. The patient does not have insomnia.     Blood pressure (!) 144/79, pulse 90, temperature 98.6 F (37 C), temperature source Oral, resp. rate 18, height 6\' 2"  (1.88 m), weight (!) 198.7 kg (438 lb).Body mass index is 56.24 kg/m.  General Appearance: Casual and Fairly Groomed  Eye Contact:  Good  Speech:  Clear and Coherent and Normal Rate  Volume:  Normal  Mood:  Anxious and Depressed  Affect:  Blunt, Congruent and Constricted  Thought Process:  Coherent and Goal Directed  Orientation:  Full (Time, Place, and Person)  Thought Content:  Hallucinations: Auditory Visual and Paranoid Ideation  Suicidal Thoughts:  No  Homicidal Thoughts:  No  Memory:  Immediate;   Fair Recent;   Fair Remote;   Fair  Judgement:  Poor  Insight:  Lacking  Psychomotor Activity:  Normal  Concentration:  Concentration: Fair  Recall:  FiservFair  Fund of Knowledge:  Fair  Language:  Fair  Akathisia:  No  Handed:    AIMS (if indicated):     Assets:  Resilience  ADL's:  Intact  Cognition:  WNL  Sleep:  Number of Hours: 6.75    Treatment Plan Summary: Daily contact with patient to assess and evaluate symptoms and progress in treatment and Medication management   -Continue inpatient hospitalization  -Schizoaffective disorder, bipolar type             -Change haldol 5mg  po BID to haldol 5mg  po qAM + 10mg  po qhs             -Continue trileptal 300mg  po BID  - HTN              -Continue norvasc 10mg  po qDay             -  Continue hydralazine 100mg  po qDay             -Continue HCTZ 12.5mg  po qDay  -EPS             -Continue cogentin 1mg  po/IM q12h prn EPS  -Agitation/psychosis             -Continue haldol 5mg  po/IM q8h prn psychosis/agitation   -Continue ativan 2mg  po/IM q6h prn agitation/severe anxiety   -Anxiety             -Continue vistaril 50mg  po q8h prn mild anxiety  -Insomnia              -Continue trazodone 100mg  po qhs prn insomnia  -Encourage participation in groups and therapeutic milieu  -Disposition planning will be ongoing  Micheal Likens, MD 06/06/2018, 11:48 AM

## 2018-06-06 NOTE — Progress Notes (Signed)
Patient states that he has been having suicidal thoughts with no plan.  Patient has been compliant with all medications, attended groups and engaged in unit activities.  Patient reported feeling increasingly depressed but stated he would be able to maintain safety in the hospital.   Assess patient for safety, offer medications as prescribed, engage patient in 1:1 staff talks.   Patient able to contract for safety.  Continue to monitor as planned.    

## 2018-06-06 NOTE — Progress Notes (Signed)
Recreation Therapy Notes  Date: 6.13.19 Time: 1000 Location: [address] Dayroom  Group Topic: Coping Skills  Goal Area(s) Addresses:  Patient will be able to identify positive coping skills. Patient will be able to identify benefits of using coping skills post d/c.  Intervention: Mind map    Activity: Mind map.  LRT and patients filled in the first eight boxes of the mind map (job, pain, impulses, depression, transportation, medication, anger/aggression and family) together.  Patients then identified coping skills for each situation individually before reconvening as a group.  Education: PharmacologistCoping Skills, Building control surveyorDischarge Planning.   Education Outcome: Acknowledges understanding/In group clarification offered/Needs additional education.   Clinical Observations/Feedback: Pt did not attend group.     Caroll RancherMarjette Amonie Wisser, LRT/CTRS         Caroll RancherLindsay, Marieme Mcmackin A 06/06/2018 11:36 AM

## 2018-06-06 NOTE — Progress Notes (Signed)
Adult Psychoeducational Group Note  Date:  06/06/2018 Time:  8:53 PM  Group Topic/Focus:  Coping With Mental Health Crisis:   The purpose of this group is to help patients identify strategies for coping with mental health crisis.  Group discusses possible causes of crisis and ways to manage them effectively.  Participation Level:  None  Participation Quality:  Attentive  Affect:  Appropriate  Cognitive:  Appropriate  Insight: None  Engagement in Group:  Limited  Modes of Intervention:  Discussion and Education  Additional Comments:  N/A  Lyndee HensenGoins, Kirstina Leinweber R 06/06/2018, 8:53 PM

## 2018-06-06 NOTE — [facility] Group Notes (Signed)
[facility]  06/06/2018 , 1:15 PM    Type of Therapy:  [facility] [employer] Presentation  Participation Level:  Invited.  Chose to not attend.  Participation Quality:  Attentive  Affect:  Blunted  Cognitive:  Oriented  Insight:  Limited  Engagement in Therapy:  Engaged  Modes of Intervention:  Discussion, Education and Socialization  Summary of Progress/Problems:  Allen Sosa  from [employer] came to present her recovery story, encourage group  members to share something about their story, and present information about the [facility].    Daryel Geraldorth, Ave Scharnhorst B 06/06/2018 , 1:15 PM

## 2018-06-06 NOTE — [facility] Group Notes (Signed)
[facility] Group Notes:  (Nursing/MHT/Case Management/Adjunct)  Date:  06/06/2018  Time:  5:32 PM  Type of Therapy:  Psychoeducational Skills  Participation Level:  Did Not Attend   Audrie Lializabeth O Araseli Sherry 06/06/2018, 5:32 PM

## 2018-06-07 MED ORDER — OLANZAPINE 10 MG PO TABS
10.0000 mg | ORAL_TABLET | Freq: Every day | ORAL | Status: DC
  Administered 2018-06-07: 10 mg via ORAL
  Filled 2018-06-07 (×4): qty 1

## 2018-06-07 MED ORDER — HYDRALAZINE HCL 10 MG PO TABS
10.0000 mg | ORAL_TABLET | Freq: Four times a day (QID) | ORAL | Status: DC | PRN
  Administered 2018-06-07 – 2018-06-13 (×3): 10 mg via ORAL
  Filled 2018-06-07 (×3): qty 1

## 2018-06-07 NOTE — [facility] Group Notes (Signed)
LCSW Group Therapy Note  06/07/2018 1:15pm  Type of Therapy and Topic:  Group Therapy:  Feelings around Relapse and Recovery  Participation Level:  Minimal   Description of Group:    Patients in this group will discuss emotions they experience before and after a relapse. They will process how experiencing these feelings, or avoidance of experiencing them, relates to having a relapse. Facilitator will guide patients to explore emotions they have related to recovery. Patients will be encouraged to process which emotions are more powerful. They will be guided to discuss the emotional reaction significant others in their lives may have to their relapse or recovery. Patients will be assisted in exploring ways to respond to the emotions of others without this contributing to a relapse.  Therapeutic Goals: 1. Patient will identify two or more emotions that lead to a relapse for them 2. Patient will identify two emotions that result when they relapse 3. Patient will identify two emotions related to recovery 4. Patient will demonstrate ability to communicate their needs through discussion and/or role plays   Summary of Patient Progress:  Stayed the entire time, engaged throughout. "I know I am not in recovery when my symptoms come back.  I think it had to do with a relationship-my wife. I guess the hospital is my way of dealing with relapse."   Therapeutic Modalities:   Cognitive Behavioral Therapy Solution-Focused Therapy Assertiveness Training Relapse Prevention Therapy   Ida RogueRodney B Toini Failla, LCSW 06/07/2018 3:31 PM

## 2018-06-07 NOTE — Progress Notes (Signed)
Nursing Progress Note: 7-7p  D- Mood is depressed, pt reports passive S/I  and decrease energy.C/o Increase thirst, drinking pitchers of water. Discussed  risk for hypertension. Pt Affect is blunted and appropriate. Pt is able to contract for safety. Continues to have difficulty staying asleep.  A - Observed pt minimally interacting group and in the milieu.Support and encouragement offered, safety maintained with q 15 minutes.  R-Contracts for safety and continues to follow treatment plan, working on learning new coping skills. Educated pt on healthy diet ,exercise and Trileptal.

## 2018-06-07 NOTE — Progress Notes (Signed)
Recreation Therapy Notes  Date: 6.14.19 Time: 1000 Location: 500 Hall Dayroom  Group Topic: Self-Esteem  Goal Area(s) Addresses:  Patient will successfully identify positive attributes about themselves.  Patient will successfully identify benefit of improved self-esteem.   Behavioral Response: None  Intervention: Magazines, scissors, glue sticks, construction paper  Activity: Collage About Me.  Patients were instructed to use the supplies provided to create a collage of things that describe them.  Education:  Self-Esteem, Building control surveyorDischarge Planning.   Education Outcome: Acknowledges education/In group clarification offered/Needs additional education  Clinical Observations/Feedback: Pt did not participate.  Pt sat and observed before leaving and not returning.    Caroll RancherMarjette Ector Laurel, LRT/CTRS      Caroll RancherLindsay, Roizy Harold A 06/07/2018 11:53 AM

## 2018-06-07 NOTE — Progress Notes (Signed)
[facility] MD Progress Note  06/07/2018 3:23 PM Allen Sosa  MRN:  [id number] Subjective:    Allen Sosa is a 37 y/o M with history of schizoaffective disorder bipolar type who was admitted voluntarily from [facility] with worsening symptoms of depression, SI with plan to lie in the road, and worsening AH. Pt was medically cleared and then transferred to [facility] for additional treatment and stabilization.Pt had been receiving [place] injections as an outpatient, but he did not feel that they were helpful for his symptoms, so he was transitioned to regimen of trileptal and abilify. Pt reported ongoing symptoms of AH, paranoia, and anxiety, and he shared previous efficacy for 5 years while on haldol, so he was changed from abilify to haldol. He has been reporting incremental improvement of his presenting symptoms.  Today upon evaluation, pt shares, "I feel a little sedated. I feel like I'm floating and dreaming. I'm still feeling paranoid or maybe having anxiety." Pt continues to endorse AH, but reports that intensity has been reduced. He endorses VH of "bright lights". He reports SI without plan, stating intensity is "a little bit" and he is able to contract for safety while admitted. He denies HI. He denies other physical complaints. He requests to change haldol to zyprexa, stating that zyprexa is the only other medication for which he had an extended period of stability. Discussed with patient that there is more risk of weight-gain associated with zyprexa, which he had previously discussed as he was trying to avoid, and after discussion of risks, benefits, and alternatives of trial of zyprexa, pt confirmed he would like to stop haldol and start zyprexa. He had no further questions, comments, or concerns.   Principal Problem: Schizoaffective disorder, bipolar type (HCC) Diagnosis:   Patient Active Problem List   Diagnosis Date Noted  . Auditory hallucinations [R44.0]   . Depression [F32.9]   .  Suicidal ideation [R45.851]   . Hypertension [I10] 01/09/2017  . Obesity [E66.9] 01/09/2017  . Schizoaffective disorder, bipolar type (HCC) [F25.0] 01/07/2017  . 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 Abdominal pain [R10.9] 11/20/2014  . Abdominal pain in male [R10.9] 11/18/2014   Total Time spent with patient: 30 minutes  Past Psychiatric History: see H&P  Past Medical History:  Past Medical History:  Diagnosis Date  . Anxiety   . Depression   . Hyperlipemia   . Hypertension   . Obesity   . Schizo affective schizophrenia (HCC)   . Sleep apnea     Past Surgical History:  Procedure Laterality Date  . VASECTOMY     Family History:  Family History  Problem Relation Age of Onset  . Heart attack Mother   . Cancer Mother   . Cancer Sister    Family Psychiatric  History: see H&P Social History:  Social History   Substance and Sexual Activity  Alcohol Use No  . Alcohol/week: 0.0 oz     Social History   Substance and Sexual Activity  Drug Use No    Social History   Socioeconomic History  . Marital status: Married    Spouse name: Not on file  . Number of children: 1  . Years of education: 16  . Highest education level: Not on file  Occupational History  . Occupation: [employer]  Social Needs  . Financial resource strain: Not on file  . Food insecurity:    Worry: Not on file    Inability: Not on file  .  Transportation needs:    Medical: Not on file    Non-medical: Not on file  Tobacco Use  . Smoking status: Never Smoker  . Smokeless tobacco: Never Used  Substance and Sexual Activity  . Alcohol use: No    Alcohol/week: 0.0 oz  . Drug use: No  . Sexual activity: Yes    Birth control/protection: None  Lifestyle  . Physical activity:    Days per week: Not on file    Minutes per session: Not on file  . Stress: Not on file  Relationships  . Social connections:    Talks on phone: Not on file    Gets  together: Not on file    Attends religious service: Not on file    Active member of club or organization: Not on file    Attends meetings of clubs or organizations: Not on file    Relationship status: Not on file  Other Topics Concern  . Not on file  Social History Narrative   Drinks caffeine daily    Additional Social History:                         Sleep: Good  Appetite:  Good  Current Medications: Current Facility-Administered Medications  Medication Dose Route Frequency Provider Last Rate Last Dose  . acetaminophen (TYLENOL) tablet 650 mg  650 mg Oral Q6H PRN Laveda AbbeParks, Laurie Britton, NP   650 mg at 06/05/18 1722  . alum & mag hydroxide-simeth (MAALOX/MYLANTA) 200-200-20 MG/5ML suspension 30 mL  30 mL Oral Q4H PRN Laveda AbbeParks, Laurie Britton, NP      . amLODipine (NORVASC) tablet 10 mg  10 mg Oral Daily Laveda AbbeParks, Laurie Britton, NP   10 mg at 06/07/18 [id number]  . benztropine (COGENTIN) tablet 1 mg  1 mg Oral BID PRN Micheal Likensainville, Thelonious Kauffmann T, MD       Or  . benztropine mesylate (COGENTIN) injection 1 mg  1 mg Intramuscular BID PRN Micheal Likensainville, Marcelene Weidemann T, MD      . haloperidol (HALDOL) tablet 5 mg  5 mg Oral Q8H PRN Micheal Likensainville, Eshaan Titzer T, MD       Or  . haloperidol lactate (HALDOL) injection 5 mg  5 mg Intramuscular Q8H PRN Micheal Likensainville, Anthony Roland T, MD      . hydrALAZINE (APRESOLINE) tablet 100 mg  100 mg Oral Daily Laveda AbbeParks, Laurie Britton, NP   100 mg at 06/07/18 [id number]  . hydrochlorothiazide (MICROZIDE) capsule 12.5 mg  12.5 mg Oral Daily Laveda AbbeParks, Laurie Britton, NP   12.5 mg at 06/07/18 [id number]  . hydrOXYzine (ATARAX/VISTARIL) tablet 50 mg  50 mg Oral TID PRN Laveda AbbeParks, Laurie Britton, NP   50 mg at 06/05/18 2302  . LORazepam (ATIVAN) tablet 2 mg  2 mg Oral Q6H PRN Micheal Likensainville, Tregan Read T, MD       Or  . LORazepam (ATIVAN) injection 2 mg  2 mg Intramuscular Q6H PRN Micheal Likens[place]ville, Dnyla Antonetti T, MD      . magnesium hydroxide (MILK OF MAGNESIA) suspension 30 mL  30 mL Oral Daily PRN Laveda AbbeParks,  Laurie Britton, NP      . OLANZapine [facility](ZYPREXA) tablet 10 mg  10 mg Oral QHS Micheal Likensainville, Aristidis Talerico T, MD      . Oxcarbazepine (TRILEPTAL) tablet 300 mg  300 mg Oral BID Laveda AbbeParks, Laurie Britton, NP   300 mg at 06/07/18 [id number]  . traZODone (DESYREL) tablet 100 mg  100 mg Oral QHS PRN Nira ConnBerry, Jason A, NP   100 mg at 06/03/18 2125  Lab Results: No results found for this or any previous visit (from the past 48 hour(s)).  Blood Alcohol level:  Lab Results  Component Value Date   ETH <10 06/02/2018   ETH <10 05/31/2018    Metabolic Disorder Labs: Lab Results  Component Value Date   HGBA1C 5.6 01/09/2017   MPG 114 01/09/2017   Lab Results  Component Value Date   PROLACTIN 33.8 (H) 01/09/2017   Lab Results  Component Value Date   CHOL 198 01/09/2017   TRIG 128 01/09/2017   HDL 40 (L) 01/09/2017   CHOLHDL 5.0 01/09/2017   VLDL 26 01/09/2017   LDLCALC 132 (H) 01/09/2017    Physical Findings: AIMS: Facial and Oral Movements Muscles of Facial Expression: None, normal Lips and Perioral Area: None, normal Jaw: None, normal Tongue: None, normal,Extremity Movements Upper (arms, wrists, hands, fingers): None, normal Lower (legs, knees, ankles, toes): None, normal, Trunk Movements Neck, shoulders, hips: None, normal, Overall Severity Severity of abnormal movements (highest score from questions above): None, normal Incapacitation due to abnormal movements: None, normal Patient's awareness of abnormal movements (rate only patient's report): No Awareness, Dental Status Current problems with teeth and/or dentures?: No Does patient usually wear dentures?: No  CIWA:    COWS:     Musculoskeletal: Strength & Muscle Tone: within normal limits Gait & Station: normal Patient leans: N/A  Psychiatric Specialty Exam: Physical Exam  Nursing note and vitals reviewed.   Review of Systems  Constitutional: Negative for chills and fever.  Respiratory: Negative for cough and shortness of breath.    Cardiovascular: Negative for chest pain.  Gastrointestinal: Negative for abdominal pain, heartburn, nausea and vomiting.  Psychiatric/Behavioral: Positive for depression, hallucinations and suicidal ideas. The patient is nervous/anxious. The patient does not have insomnia.     Blood pressure 119/82, pulse (!) 115, temperature 98.7 F (37.1 C), resp. rate 18, height 6\' 2"  (1.88 m), weight (!) 198.7 kg (438 lb).Body mass index is 56.24 kg/m.  General Appearance: Casual and Fairly Groomed  Eye Contact:  Good  Speech:  Clear and Coherent and Normal Rate  Volume:  Normal  Mood:  Anxious and Depressed  Affect:  Congruent and Constricted  Thought Process:  Coherent and Goal Directed  Orientation:  Full (Time, Place, and Person)  Thought Content:  Hallucinations: Auditory Visual  Suicidal Thoughts:  Yes.  without intent/plan  Homicidal Thoughts:  No  Memory:  Immediate;   Fair Recent;   Fair Remote;   Fair  Judgement:  Poor  Insight:  Lacking  Psychomotor Activity:  Normal  Concentration:  Concentration: Fair  Recall:  [employer] of Knowledge:  Fair  Language:  Fair  Akathisia:  No  Handed:    AIMS (if indicated):     Assets:  Resilience Social Support  ADL's:  Intact  Cognition:  WNL  Sleep:  Number of Hours: 6   Treatment Plan Summary: Daily contact with patient to assess and evaluate symptoms and progress in treatment and Medication management   -Continue inpatient hospitalization  -Schizoaffective disorder, bipolar type  -DC haldol 5mg  po qAM + 10mg  po qhs   -Start zyprexa 10mg  po qhs -Continue trileptal 300mg  po BID  - HTN  -Continue norvasc 10mg  po qDay -Continue hydralazine 100mg  po qDay -Continue HCTZ 12.5mg  po qDay  -EPS -Continue cogentin 1mg  po/IM q12h prn EPS  -Agitation/psychosis -Continue haldol 5mg  po/IM q8h prn psychosis/agitation             -Continue ativan 2mg   po/IM q6h prn agitation/severe anxiety   -Anxiety -Continue vistaril 50mg  po q8h prn mild anxiety  -Insomnia -Continue trazodone 100mg  po qhs prn insomnia  -Encourage participation in groups and therapeutic milieu  -Disposition planning will be ongoing  Micheal Likens, MD 06/07/2018, 3:23 PM

## 2018-06-08 DIAGNOSIS — F339 Major depressive disorder, recurrent, unspecified: Secondary | ICD-10-CM

## 2018-06-08 MED ORDER — OLANZAPINE 7.5 MG PO TABS
15.0000 mg | ORAL_TABLET | Freq: Every day | ORAL | Status: DC
  Administered 2018-06-08 – 2018-06-12 (×5): 15 mg via ORAL
  Filled 2018-06-08 (×6): qty 2
  Filled 2018-06-08: qty 6

## 2018-06-08 NOTE — Progress Notes (Signed)
Patient ID: Allen RenMichael Boakye V., male   DOB: 10/12/1981, 37 y.o.   MRN: [id number] DAR Note: Pt observed in room resting with eyes closed. Pt at assessment moderate anxiety, depression and AH; "they kept mumbling stuff in my ears."-denied command hallucinations. Pt denied SI/HI or pain. Pt was med compliant. All patient's questions and concerns addressed. Support, encouragement, and safe environment provided. 15-minute safety checks continue. Pt did not attend wrap-up group.

## 2018-06-08 NOTE — Progress Notes (Addendum)
[facility] MD Progress Note  06/08/2018 3:35 PM Allen Sosa  MRN:  [id number]   Subjective: Allen Sosa seen resting in bedroom.  Presents flat guarded but pleasant.  Reports he feels his medications as have an effect on his whole body.  Patient provided education with hypertension medications.  Denies headaches, chest pain or blurred vision during this assessment.  Noted and chart reviewed: antipsychotics prescribed nightly.  Patient continues to present with apprehension with taking medications daily.  Patient appears to have poor insight with medication.  denies suicidal or homicidal ideations.  Reports paranoia and intermittent auditory hallucinations denies that they are command in nature.  Reports a good appetite and states he is resting well.  Denies depression or depressive symptoms patient encouraged to attend daily group sessions support encouragement reassurance was provided  History Per assessment note:Allen Sosa is a 37 y/o M with history of schizoaffective disorder bipolar type who was admitted voluntarily from [facility] with worsening symptoms of depression, SI with plan to lie in the road, and worsening AH. Pt was medically cleared and then transferred to [facility] for additional treatment and stabilization.Pt had been receiving [place] injections as an outpatient, but he did not feel that they were helpful for his symptoms, so he was transitioned to regimen of trileptal and abilify. Pt reported ongoing symptoms of AH, paranoia, and anxiety, and he shared previous efficacy for 5 years while on haldol, so he was changed from abilify to haldol. He has been reporting incremental improvement of his presenting symptoms.    Principal Problem: Schizoaffective disorder, bipolar type (HCC) Diagnosis:   Patient Active Problem List   Diagnosis Date Noted  . Auditory hallucinations [R44.0]   . Depression [F32.9]   . Suicidal ideation [R45.851]   . Hypertension [I10] 01/09/2017  . Obesity [E66.9]  01/09/2017  . Schizoaffective disorder, bipolar type (HCC) [F25.0] 01/07/2017  . 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 Abdominal pain [R10.9] 11/20/2014  . Abdominal pain in male [R10.9] 11/18/2014   Total Time spent with patient: 30 minutes  Past Psychiatric History: see H&P  Past Medical History:  Past Medical History:  Diagnosis Date  . Anxiety   . Depression   . Hyperlipemia   . Hypertension   . Obesity   . Schizo affective schizophrenia (HCC)   . Sleep apnea     Past Surgical History:  Procedure Laterality Date  . VASECTOMY     Family History:  Family History  Problem Relation Age of Onset  . Heart attack Mother   . Cancer Mother   . Cancer Sister    Family Psychiatric  History: see H&P Social History:  Social History   Substance and Sexual Activity  Alcohol Use No  . Alcohol/week: 0.0 oz     Social History   Substance and Sexual Activity  Drug Use No    Social History   Socioeconomic History  . Marital status: Married    Spouse name: Not on file  . Number of children: 1  . Years of education: 78  . Highest education level: Not on file  Occupational History  . Occupation: [employer]  Social Needs  . Financial resource strain: Not on file  . Food insecurity:    Worry: Not on file    Inability: Not on file  . Transportation needs:    Medical: Not on file    Non-medical: Not on file  Tobacco Use  . Smoking status: Never Smoker  .  Smokeless tobacco: Never Used  Substance and Sexual Activity  . Alcohol use: No    Alcohol/week: 0.0 oz  . Drug use: No  . Sexual activity: Yes    Birth control/protection: None  Lifestyle  . Physical activity:    Days per week: Not on file    Minutes per session: Not on file  . Stress: Not on file  Relationships  . Social connections:    Talks on phone: Not on file    Gets together: Not on file    Attends religious service: Not on file    Active member of club  or organization: Not on file    Attends meetings of clubs or organizations: Not on file    Relationship status: Not on file  Other Topics Concern  . Not on file  Social History Narrative   Drinks caffeine daily    Additional Social History:                         Sleep: Good  Appetite:  Good  Current Medications: Current Facility-Administered Medications  Medication Dose Route Frequency Provider Last Rate Last Dose  . acetaminophen (TYLENOL) tablet 650 mg  650 mg Oral Q6H PRN Laveda Abbe, NP   650 mg at 06/08/18 1127  . alum & mag hydroxide-simeth (MAALOX/MYLANTA) 200-200-20 MG/5ML suspension 30 mL  30 mL Oral Q4H PRN Laveda Abbe, NP      . amLODipine (NORVASC) tablet 10 mg  10 mg Oral Daily Laveda Abbe, NP   10 mg at 06/08/18 1610  . benztropine (COGENTIN) tablet 1 mg  1 mg Oral BID PRN Allen Likens, MD       Or  . benztropine mesylate (COGENTIN) injection 1 mg  1 mg Intramuscular BID PRN Allen Likens, MD      . haloperidol (HALDOL) tablet 5 mg  5 mg Oral Q8H PRN Allen Likens, MD       Or  . haloperidol lactate (HALDOL) injection 5 mg  5 mg Intramuscular Q8H PRN Allen Likens, MD      . hydrALAZINE (APRESOLINE) tablet 10 mg  10 mg Oral Q6H PRN Donell Sievert E, PA-C   10 mg at 06/07/18 2230  . hydrALAZINE (APRESOLINE) tablet 100 mg  100 mg Oral Daily Laveda Abbe, NP   100 mg at 06/08/18 9604  . hydrochlorothiazide (MICROZIDE) capsule 12.5 mg  12.5 mg Oral Daily Laveda Abbe, NP   12.5 mg at 06/08/18 5409  . hydrOXYzine (ATARAX/VISTARIL) tablet 50 mg  50 mg Oral TID PRN Laveda Abbe, NP   50 mg at 06/08/18 1503  . LORazepam (ATIVAN) tablet 2 mg  2 mg Oral Q6H PRN Allen Likens, MD       Or  . LORazepam (ATIVAN) injection 2 mg  2 mg Intramuscular Q6H PRN Allen Likens, MD      . magnesium hydroxide (MILK OF MAGNESIA) suspension 30 mL  30 mL Oral  Daily PRN Laveda Abbe, NP      . OLANZapine [facility]) tablet 10 mg  10 mg Oral QHS Allen Likens, MD   10 mg at 06/07/18 2230  . Oxcarbazepine (TRILEPTAL) tablet 300 mg  300 mg Oral BID Laveda Abbe, NP   300 mg at 06/08/18 8119  . traZODone (DESYREL) tablet 100 mg  100 mg Oral QHS PRN Nira Conn A, NP   100 mg at 06/07/18 2230  Lab Results: No results found for this or any previous visit (from the past 48 hour(s)).  Blood Alcohol level:  Lab Results  Component Value Date   ETH <10 06/02/2018   ETH <10 05/31/2018    Metabolic Disorder Labs: Lab Results  Component Value Date   HGBA1C 5.6 01/09/2017   MPG 114 01/09/2017   Lab Results  Component Value Date   PROLACTIN 33.8 (H) 01/09/2017   Lab Results  Component Value Date   CHOL 198 01/09/2017   TRIG 128 01/09/2017   HDL 40 (L) 01/09/2017   CHOLHDL 5.0 01/09/2017   VLDL 26 01/09/2017   LDLCALC 132 (H) 01/09/2017    Physical Findings: AIMS: Facial and Oral Movements Muscles of Facial Expression: None, normal Lips and Perioral Area: None, normal Jaw: None, normal Tongue: None, normal,Extremity Movements Upper (arms, wrists, hands, fingers): None, normal Lower (legs, knees, ankles, toes): None, normal, Trunk Movements Neck, shoulders, hips: None, normal, Overall Severity Severity of abnormal movements (highest score from questions above): None, normal Incapacitation due to abnormal movements: None, normal Patient's awareness of abnormal movements (rate only patient's report): No Awareness, Dental Status Current problems with teeth and/or dentures?: No Does patient usually wear dentures?: No  CIWA:    COWS:     Musculoskeletal: Strength & Muscle Tone: within normal limits Gait & Station: normal Patient leans: N/A  Psychiatric Specialty Exam: Physical Exam  Nursing note and vitals reviewed.   Review of Systems  Cardiovascular: Negative for chest pain and palpitations.   Psychiatric/Behavioral: Positive for depression, hallucinations and suicidal ideas. The patient is nervous/anxious. The patient does not have insomnia.   All other systems reviewed and are negative.   Blood pressure 131/73, pulse 94, temperature 98.8 F (37.1 C), temperature source Oral, resp. rate 20, height 6\' 2"  (1.88 m), weight (!) 198.7 kg (438 lb), SpO2 97 %.Body mass index is 56.24 kg/m.  General Appearance: Casual and Fairly Groomed  Eye Contact:  Good  Speech:  Clear and Coherent and Normal Rate  Volume:  Normal  Mood:  Anxious and Depressed  Affect:  Congruent and Constricted  Thought Process:  Coherent and Goal Directed  Orientation:  Full (Time, Place, and Person)  Thought Content:  Hallucinations: Auditory Visual  Suicidal Thoughts:  No during this assessment however patient is able to contract for safety for reported SI on admission  Homicidal Thoughts:  No  Memory:  Immediate;   Fair Recent;   Fair Remote;   Fair  Judgement:  Poor  Insight:  Lacking  Psychomotor Activity:  Normal  Concentration:  Concentration: Fair  Recall:  [employer]  Fund of Knowledge:  Fair  Language:  Fair  Akathisia:  No  Handed:    AIMS (if indicated):     Assets:  Resilience Social Support  ADL's:  Intact  Cognition:  WNL  Sleep:  Number of Hours: 6.25   Treatment Plan Summary: Daily contact with patient to assess and evaluate symptoms and progress in treatment and Medication management   -Continue current treatment plan listed below on 06/08/2018 except for noted   -Schizoaffective disorder, bipolar type  -DC haldol 5mg  po qAM + 10mg  po qhs   -Continue  zyprexa 10mg  po qhs -Continue trileptal 300mg  po BID  - HTN  -Continue norvasc 10mg  po qDay -Continue hydralazine 100mg  po qDay -Continue HCTZ 12.5mg  po qDay  -EPS -Continue cogentin 1mg  po/IM q12h prn EPS  -Agitation/psychosis -Continue  haldol 5mg  po/IM q8h prn psychosis/agitation             -  Continue ativan 2mg  po/IM q6h prn agitation/severe anxiety   -Anxiety -Continue vistaril 50mg  po q8h prn mild anxiety  -Insomnia -Continue trazodone 100mg  po qhs prn insomnia  -Encourage participation in groups and therapeutic milieu -Disposition planning will be ongoing  [name], NP 06/08/2018, 3:35 PM    ..Agree with NP Progress Note

## 2018-06-08 NOTE — Progress Notes (Signed)
Patient ID: Allen RenMichael Markman V., male   DOB: 08-22-1981, 37 y.o.   MRN: [id number]    D: Pt has been very flat and depressed on the unit today. Pt has also been very isolative and has remained in his room most of the day. Pt reported that he felt his medication was making him feel funny, Tanika NP made aware. Pt took all medication without any problems. Pt reported that his depression was a 0, his anxiety was a 0, and his hopelessness was a 0. Pt reported being negative SI/HI, no AH/VH noted. A: 15 min checks continued for patient safety. R: Pt safety maintained.

## 2018-06-08 NOTE — [facility] Group Notes (Signed)
  [facility] LCSW Group Therapy Note  Date/Time:  06/08/2018 11:15AM-12:00PM  Type of Therapy and Topic:  Group Therapy:  Feelings About Hospitalization  Participation Level:  Did Not Attend   Description of Group This process group involved patients discussing their feelings related to being hospitalized, as well as the benefits they see to being in the hospital.  These feelings and benefits were itemized.  The group then brainstormed specific ways in which they could seek those same benefits when they discharge and return home.  Therapeutic Goals 1. Patient will identify and describe positive and negative feelings related to hospitalization 2. Patient will verbalize benefits of hospitalization to themselves personally 3. Patients will brainstorm together ways they can obtain similar benefits in the outpatient setting, identify barriers to wellness and possible solutions  Summary of Patient Progress:  N/A  Therapeutic Modalities Cognitive Behavioral Therapy Motivational Interviewing    Ambrose MantleMareida Grossman-Orr, LCSW 06/08/2018, 8:14 AM

## 2018-06-08 NOTE — Progress Notes (Signed)
Adult Psychoeducational Group Note  Date:  06/08/2018 Time:  9:24 PM  Group Topic/Focus:  Wrap-Up Group:   The focus of this group is to help patients review their daily goal of treatment and discuss progress on daily workbooks.  Participation Level:  Active  Participation Quality:  Appropriate  Affect:  Appropriate  Cognitive:  Appropriate  Insight: Appropriate  Engagement in Group:  Engaged  Modes of Intervention:  Discussion  Additional Comments: The patient expressed that he rates today a 10.The patient also said that he attended groups.  Octavio Mannshigpen, Marwin Primmer Lee 06/08/2018, 9:24 PM

## 2018-06-09 NOTE — [facility] Group Notes (Signed)
Adult Psychoeducational Group Note  Date:  06/09/2018 Time:  9:30 AM  Group Topic/Focus: Orientation/Goals Group Orientation:   The focus of this group is to educate the patient on the purpose and policies of crisis stabilization and provide a format to answer questions about their admission.  The group details unit policies and expectations of patients while admitted. Goals Group:   The focus of this group is to help patients establish daily goals to achieve during treatment and discuss how the patient can incorporate goal setting into their daily lives to aide in recovery.   Participation Level:  Did Not Attend  Modes of Intervention:  Discussion and Education  Additional Comments:  Patient was invited but declined to attend group.  [name] 06/09/2018, 10:00 AM 

## 2018-06-09 NOTE — [facility] Group Notes (Signed)
[facility] LCSW Group Therapy Note  Date/Time:  06/09/2018  11:00AM-12:00PM  Type of Therapy and Topic:  Group Therapy:  Music and Mood  Participation Level:  Did Not Attend   Description of Group: In this process group, members listened to a variety of genres of music and identified that different types of music evoke different responses.  Patients were encouraged to identify music that was soothing for them and music that was energizing for them.  Patients discussed how this knowledge can help with wellness and recovery in various ways including managing depression and anxiety as well as encouraging healthy sleep habits.    Therapeutic Goals: 1. Patients will explore the impact of different varieties of music on mood 2. Patients will verbalize the thoughts they have when listening to different types of music 3. Patients will identify music that is soothing to them as well as music that is energizing to them 4. Patients will discuss how to use this knowledge to assist in maintaining wellness and recovery 5. Patients will explore the use of music as a coping skill  Summary of Patient Progress:  N/A  Therapeutic Modalities: Solution Focused Brief Therapy Activity   Syrina Wake Grossman-Orr, LCSW    

## 2018-06-09 NOTE — [facility] Group Notes (Signed)
Adult Psychoeducational Group Note  Date:  06/09/2018 Time:  10:00 AM  Group Topic/Focus: Love Language Healthy Communication:   The focus of this group is to discuss communication, barriers to communication, as well as healthy ways to communicate with others.  Participation Level:  Did Not Attend  Modes of Intervention:  Discussion and Education  Additional Comments:  Patient was invited but declined to attend group.  Joban Colledge A Breuna Loveall 06/09/2018, 10:30 AM 

## 2018-06-09 NOTE — Progress Notes (Signed)
Patient ID: Allen RenMichael Forgue V., male   DOB: 03/03/1981, 37 y.o.   MRN: [id number]  Nursing Progress Note [id number]  Data: Patient presents with flat affect and sad/depressed mood. Patient is minimal but pleasant with Clinical research associatewriter. Patient complaint with scheduled medications. Patient is isolative to his room and does not attend groups. Patient completed self-inventory sheet and rates depression, hopelessness, and anxiety 4,2,8 respectively. Patient rates their sleep and appetite as fair/poor respectively. Patient states goal for today is to "stay awake" and "talk". Patient reports mild abdominal pain today but has been eating. Patient currently denies SI/HI but continues to report AVH.   Action: Patient educated about and provided medication per provider's orders. Patient safety maintained with q15 min safety checks and frequent rounding. Low fall risk precautions in place. Emotional support given. 1:1 interaction and active listening provided. Patient encouraged to attend meals and groups. Patient encouraged to work on treatment plan and goals. Labs, vital signs and patient behavior monitored throughout shift.   Response: Patient agrees to come to staff if any thoughts of SI/HI develop or if patient develops intention of acting on thoughts. Patient remains safe on the unit at this time. Patient is interacting with peers appropriately on the unit. Will continue to support and monitor.

## 2018-06-09 NOTE — Plan of Care (Signed)
Problem: Safety: Goal: Periods of time without injury will increase Intervention: Patient contracts for safety on the unit. Low fall risk precautions in place. Safety monitored with q15 minute checks. Outcome: Patient remains safe on the unit at this time. 06/09/2018 1:42 PM - Progressing by Ferrel Loganollazo, Luciel Brickman A, RN

## 2018-06-09 NOTE — Progress Notes (Signed)
Patient ID: Allen RenMichael Gothard V., male   DOB: [birth year], 37 y.o.   MRN: [id number]  Patient requested pitcher to be refilled with ice water. Writer refilled pitched and asked patient if he wanted his 1700 Trileptal. Patient inquired about medication and Clinical research associatewriter educated patient about purpose/benefit. Patient appeared paranoid and refused his medication. Patient began talking about his blood pressure medications and stated, "my blood pressure is higher now than before". Patient reports not taking his medications at home. Patient continued to refuse scheduled Trileptal despite encouragement from nurse. Patient requests Vistaril instead. Patient medicated with PRN order. Will continue to support and monitor and attempt to medicate with scheduled Tripletal again later.

## 2018-06-09 NOTE — Progress Notes (Signed)
[facility] MD Progress Note  06/09/2018 12:17 PM Brandonlee Navis  MRN:  [id number]   Subjective: Allen Sosa seen standing at the nurses station completing daily inventory sheet Continues to present flat guarded but pleasant. reports auditory and visual hallucinations of "shadows and figures"  States slight improvement with medication adjustment on yesteday, as patient  Zyprexa was adjusted to 15 mg patient reports taking and tolerating medications well.  Reports just waking up stories unsure of his depression mood. States he has attended group sessions since his admission.  Reports a good appetite.  Continue to encourage ADLs and hygiene.  Support encouragement reassurance was provided  History Per assessment note:Oaklan Polan is a 37 y/o M with history of schizoaffective disorder bipolar type who was admitted voluntarily from [facility] with worsening symptoms of depression, SI with plan to lie in the road, and worsening AH. Pt was medically cleared and then transferred to [facility] for additional treatment and stabilization.Pt had been receiving [place] injections as an outpatient, but he did not feel that they were helpful for his symptoms, so he was transitioned to regimen of trileptal and abilify. Pt reported ongoing symptoms of AH, paranoia, and anxiety, and he shared previous efficacy for 5 years while on haldol, so he was changed from abilify to haldol. He has been reporting incremental improvement of his presenting symptoms.    Principal Problem: Schizoaffective disorder, bipolar type (HCC) Diagnosis:   Patient Active Problem List   Diagnosis Date Noted  . Auditory hallucinations [R44.0]   . Depression [F32.9]   . Suicidal ideation [R45.851]   . Hypertension [I10] 01/09/2017  . Obesity [E66.9] 01/09/2017  . Schizoaffective disorder, bipolar type (HCC) [F25.0] 01/07/2017  . 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 Abdominal pain [R10.9] 11/20/2014   . Abdominal pain in male [R10.9] 11/18/2014   Total Time spent with patient: 30 minutes  Past Psychiatric History: see H&P  Past Medical History:  Past Medical History:  Diagnosis Date  . Anxiety   . Depression   . Hyperlipemia   . Hypertension   . Obesity   . Schizo affective schizophrenia (HCC)   . Sleep apnea     Past Surgical History:  Procedure Laterality Date  . VASECTOMY     Family History:  Family History  Problem Relation Age of Onset  . Heart attack Mother   . Cancer Mother   . Cancer Sister    Family Psychiatric  History: see H&P Social History:  Social History   Substance and Sexual Activity  Alcohol Use No  . Alcohol/week: 0.0 oz     Social History   Substance and Sexual Activity  Drug Use No    Social History   Socioeconomic History  . Marital status: Married    Spouse name: Not on file  . Number of children: 1  . Years of education: 81  . Highest education level: Not on file  Occupational History  . Occupation: [employer]  Social Needs  . Financial resource strain: Not on file  . Food insecurity:    Worry: Not on file    Inability: Not on file  . Transportation needs:    Medical: Not on file    Non-medical: Not on file  Tobacco Use  . Smoking status: Never Smoker  . Smokeless tobacco: Never Used  Substance and Sexual Activity  . Alcohol use: No    Alcohol/week: 0.0 oz  . Drug use: No  . Sexual activity: Yes  Birth control/protection: None  Lifestyle  . Physical activity:    Days per week: Not on file    Minutes per session: Not on file  . Stress: Not on file  Relationships  . Social connections:    Talks on phone: Not on file    Gets together: Not on file    Attends religious service: Not on file    Active member of club or organization: Not on file    Attends meetings of clubs or organizations: Not on file    Relationship status: Not on file  Other Topics Concern  . Not on file  Social History Narrative   Drinks  caffeine daily    Additional Social History:                         Sleep: Good  Appetite:  Good  Current Medications: Current Facility-Administered Medications  Medication Dose Route Frequency Provider Last Rate Last Dose  . acetaminophen (TYLENOL) tablet 650 mg  650 mg Oral Q6H PRN Laveda Abbe, NP   650 mg at 06/09/18 0946  . alum & mag hydroxide-simeth (MAALOX/MYLANTA) 200-200-20 MG/5ML suspension 30 mL  30 mL Oral Q4H PRN Laveda Abbe, NP      . amLODipine (NORVASC) tablet 10 mg  10 mg Oral Daily Laveda Abbe, NP   10 mg at 06/09/18 0849  . benztropine (COGENTIN) tablet 1 mg  1 mg Oral BID PRN Micheal Likens, MD       Or  . benztropine mesylate (COGENTIN) injection 1 mg  1 mg Intramuscular BID PRN Micheal Likens, MD      . haloperidol (HALDOL) tablet 5 mg  5 mg Oral Q8H PRN Micheal Likens, MD       Or  . haloperidol lactate (HALDOL) injection 5 mg  5 mg Intramuscular Q8H PRN Micheal Likens, MD      . hydrALAZINE (APRESOLINE) tablet 10 mg  10 mg Oral Q6H PRN Donell Sievert E, PA-C   10 mg at 06/07/18 2230  . hydrALAZINE (APRESOLINE) tablet 100 mg  100 mg Oral Daily Laveda Abbe, NP   100 mg at 06/09/18 0848  . hydrochlorothiazide (MICROZIDE) capsule 12.5 mg  12.5 mg Oral Daily Laveda Abbe, NP   12.5 mg at 06/09/18 0848  . hydrOXYzine (ATARAX/VISTARIL) tablet 50 mg  50 mg Oral TID PRN Laveda Abbe, NP   50 mg at 06/08/18 1503  . LORazepam (ATIVAN) tablet 2 mg  2 mg Oral Q6H PRN Micheal Likens, MD       Or  . LORazepam (ATIVAN) injection 2 mg  2 mg Intramuscular Q6H PRN Micheal Likens, MD      . magnesium hydroxide (MILK OF MAGNESIA) suspension 30 mL  30 mL Oral Daily PRN Laveda Abbe, NP      . OLANZapine [facility]) tablet 15 mg  15 mg Oral QHS [name], NP   15 mg at 06/08/18 2117  . Oxcarbazepine (TRILEPTAL) tablet 300 mg  300 mg Oral BID  Laveda Abbe, NP   300 mg at 06/09/18 0849  . traZODone (DESYREL) tablet 100 mg  100 mg Oral QHS PRN Nira Conn A, NP   100 mg at 06/07/18 2230    Lab Results: No results found for this or any previous visit (from the past 48 hour(s)).  Blood Alcohol level:  Lab Results  Component Value Date   ETH <  10 06/02/2018   ETH <10 05/31/2018    Metabolic Disorder Labs: Lab Results  Component Value Date   HGBA1C 5.6 01/09/2017   MPG 114 01/09/2017   Lab Results  Component Value Date   PROLACTIN 33.8 (H) 01/09/2017   Lab Results  Component Value Date   CHOL 198 01/09/2017   TRIG 128 01/09/2017   HDL 40 (L) 01/09/2017   CHOLHDL 5.0 01/09/2017   VLDL 26 01/09/2017   LDLCALC 132 (H) 01/09/2017    Physical Findings: AIMS: Facial and Oral Movements Muscles of Facial Expression: None, normal Lips and Perioral Area: None, normal Jaw: None, normal Tongue: None, normal,Extremity Movements Upper (arms, wrists, hands, fingers): None, normal Lower (legs, knees, ankles, toes): None, normal, Trunk Movements Neck, shoulders, hips: None, normal, Overall Severity Severity of abnormal movements (highest score from questions above): None, normal Incapacitation due to abnormal movements: None, normal Patient's awareness of abnormal movements (rate only patient's report): No Awareness, Dental Status Current problems with teeth and/or dentures?: No Does patient usually wear dentures?: No  CIWA:    COWS:     Musculoskeletal: Strength & Muscle Tone: within normal limits Gait & Station: normal Patient leans: N/A  Psychiatric Specialty Exam: Physical Exam  Nursing note and vitals reviewed. Constitutional: He appears well-developed.  Neurological: He is alert.  Psychiatric: He has a normal mood and affect. His behavior is normal.    Review of Systems  Psychiatric/Behavioral: Positive for depression, hallucinations and suicidal ideas. The patient is nervous/anxious. The patient  does not have insomnia.   All other systems reviewed and are negative.   Blood pressure (!) 152/79, pulse (!) 112, temperature 98.5 F (36.9 C), temperature source Oral, resp. rate 20, height 6\' 2"  (1.88 m), weight (!) 198.7 kg (438 lb), SpO2 97 %.Body mass index is 56.24 kg/m.  General Appearance: Casual and Guarded overweight   Eye Contact:  Good  Speech:  Clear and Coherent and Normal Rate  Volume:  Normal  Mood:  Anxious and Depressed  Affect:  Congruent and Constricted  Thought Process:  Coherent and Goal Directed  Orientation:  Full (Time, Place, and Person)  Thought Content:  Hallucinations: Auditory Visual shadows and figures  Suicidal Thoughts:  No during this assessment however patient is able to contract for safety for reported SI on admission  Homicidal Thoughts:  No  Memory:  Immediate;   Fair Recent;   Fair Remote;   Fair  Judgement:  Poor  Insight:  Lacking  Psychomotor Activity:  Normal  Concentration:  Concentration: Fair  Recall:  [employer] of Knowledge:  Fair  Language:  Fair  Akathisia:  No  Handed:    AIMS (if indicated):     Assets:  Resilience Social Support  ADL's:  Intact  Cognition:  WNL  Sleep:  Number of Hours: 6   Treatment Plan Summary: Daily contact with patient to assess and evaluate symptoms and progress in treatment and Medication management   -Continue current treatment plan listed below on 06/09/2018 except for noted   -Schizoaffective disorder, bipolar type  -DC haldol 5mg  po qAM + 10mg  po qhs   -Continue  zyprexa 15mg  po qhs -Continue trileptal 300mg  po BID  - HTN  -Continue norvasc 10mg  po qDay -Continue hydralazine 100mg  po qDay -Continue HCTZ 12.5mg  po qDay  -EPS -Continue cogentin 1mg  po/IM q12h prn EPS  -Agitation/psychosis -Continue haldol 5mg  po/IM q8h prn psychosis/agitation             -Continue ativan 2mg   po/IM q6h prn  agitation/severe anxiety   -Anxiety -Continue vistaril 50mg  po q8h prn mild anxiety  -Insomnia -Continue trazodone 100mg  po qhs prn insomnia  -Encourage participation in groups and therapeutic milieu -Disposition planning will be ongoing  Oneta Rackanika N Lewis, NP 06/09/2018, 12:17 PM

## 2018-06-09 NOTE — Progress Notes (Signed)
Patient ID: Allen RenMichael Ostrow V., male   DOB: 18-May-1981, 37 y.o.   MRN: [id number] DAR Note: Pt observed in room resting with eyes closed. Pt continue to be very anxious and suspicious. Pt endorsed moderate anxiety, depression and AH; "they kept telling me the things you are thinking and the things everyone is thinking."-denied command hallucinations. Pt denied SI/HI or pain. Pt was med compliant. All patient's questions and concerns addressed. Support, encouragement, and safe environment provided. 15-minute safety checks continue. Pt did not attend wrap-up group.

## 2018-06-09 NOTE — Progress Notes (Signed)
Patient ID: Allen RenMichael Luna V., male   DOB: 08-17-1981, 37 y.o.   MRN: [id number]  Patient BP elevated this evening; PRN Apresoline administered as ordered. Patient refuses Trileptal again stating, "I feel jacked up, I just need to know which one is messing me up". Patient mildly agitated. Will continue to support and monitor.

## 2018-06-09 NOTE — Progress Notes (Signed)
[facility] Group Notes:  (Nursing/MHT/Case Management/Adjunct)  Date:  06/09/2018  Time:  3:22 PM  Type of Therapy:  Psychoeducational Skills  Participation Level:  Did Not Attend  Summary of Progress/Problems: Patient did not attend afternoon psychoeducation group on healthy boundary setting.  Howell Groesbeck C Eliany Mccarter 06/09/2018, 3:22 PM 

## 2018-06-10 NOTE — Progress Notes (Signed)
Patient ID: Allen RenMichael Oberle V., male   DOB: 1981/05/30, 37 y.o.   MRN: [id number]  Nursing Progress Note [id number]  Data: Patient presents with anxious mood and flat/withdrawan affect. Patient is isolative to his room and does not attend groups. Patient complaint with scheduled medications. Patient denies pain/physical complaints. Patient completed self-inventory sheet and rates depression, hopelessness, and anxiety 0,4,5 respectively. Patient rates their sleep and appetite as fair/poor respectively. Patient has no goals for today. Patient currently denies SI/HI. Patient endorses on-going A/V hallucinations. Patient is noted to have poor hygiene and is encouraged to bathe.  Action: Patient educated about and provided medication per provider's orders. Patient safety maintained with q15 min safety checks and frequent rounding. Low fall risk precautions in place. Emotional support given. 1:1 interaction and active listening provided. Patient encouraged to attend meals and groups. Patient encouraged to work on treatment plan and goals. Labs, vital signs and patient behavior monitored throughout shift.   Response: Patient agrees to come to staff if any thoughts of SI/HI develop or if patient develops intention of acting on thoughts. Patient remains safe on the unit at this time. Patient is interacting with peers appropriately on the unit. Will continue to support and monitor.

## 2018-06-10 NOTE — Progress Notes (Signed)
[facility] MD Progress Note  06/10/2018 12:20 PM Allen Sosa  MRN:  [id number] Subjective:    Amante Fomby is a 37 y/o M with history of schizoaffective disorder bipolar type who was admitted voluntarily from [facility] with worsening symptoms of depression, SI with plan to lie in the road, and worsening AH. Allen Sosa was medically cleared and then transferred to [facility] for additional treatment and stabilization.Allen Sosa had been receiving [place] injections as an outpatient, but he did not feel that they were helpful for his symptoms, so he was transitioned to regimen of trileptal and abilify.Allen Sosa reported ongoing symptoms of AH, paranoia, and anxiety, and he shared previous efficacy for 5 years while on haldol, so he was changed from abilify to haldol; however, he then later requested to be changed to zyprexa, and that dose has been titrated up during his stay. He has been reporting incremental improvement of his presenting symptoms.  Today upon evaluation, Allen Sosa shares, "I'm still trying to get better. I'm getting stressed out. I can't tell if things are real or not sometimes."  He also reports some anxiety associated with instability of his housing situation, and we discussed how SW team can assist him with shelter. Allen Sosa reports some difficulty with ongoing AH which he describes as intensity of "8/10." He denies SI/HI/VH. He is sleeping adequately. His appetite is good. He is tolerating his medications well. Discussed with patient about treatment options. To address ongoing AH and derealization symptoms we will plan to increase dose of zyprexa at bedtime, and Allen Sosa was in agreement with this plan. He had no further questions, comments, or concerns.  Principal Problem: Schizoaffective disorder, bipolar type (HCC) Diagnosis:   Patient Active Problem List   Diagnosis Date Noted  . Auditory hallucinations [R44.0]   . Depression [F32.9]   . Suicidal ideation [R45.851]   . Hypertension [I10] 01/09/2017  . Obesity [E66.9]  01/09/2017  . Schizoaffective disorder, bipolar type (HCC) [F25.0] 01/07/2017  . 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 Abdominal pain [R10.9] 11/20/2014  . Abdominal pain in male [R10.9] 11/18/2014   Total Time spent with patient: 30 minutes  Past Psychiatric History: see H&P  Past Medical History:  Past Medical History:  Diagnosis Date  . Anxiety   . Depression   . Hyperlipemia   . Hypertension   . Obesity   . Schizo affective schizophrenia (HCC)   . Sleep apnea     Past Surgical History:  Procedure Laterality Date  . VASECTOMY     Family History:  Family History  Problem Relation Age of Onset  . Heart attack Mother   . Cancer Mother   . Cancer Sister    Family Psychiatric  History: see H&P Social History:  Social History   Substance and Sexual Activity  Alcohol Use No  . Alcohol/week: 0.0 oz     Social History   Substance and Sexual Activity  Drug Use No    Social History   Socioeconomic History  . Marital status: Married    Spouse name: Not on file  . Number of children: 1  . Years of education: 75  . Highest education level: Not on file  Occupational History  . Occupation: [employer]  Social Needs  . Financial resource strain: Not on file  . Food insecurity:    Worry: Not on file    Inability: Not on file  . Transportation needs:    Medical: Not on file    Non-medical:  Not on file  Tobacco Use  . Smoking status: Never Smoker  . Smokeless tobacco: Never Used  Substance and Sexual Activity  . Alcohol use: No    Alcohol/week: 0.0 oz  . Drug use: No  . Sexual activity: Yes    Birth control/protection: None  Lifestyle  . Physical activity:    Days per week: Not on file    Minutes per session: Not on file  . Stress: Not on file  Relationships  . Social connections:    Talks on phone: Not on file    Gets together: Not on file    Attends religious service: Not on file    Active member of club  or organization: Not on file    Attends meetings of clubs or organizations: Not on file    Relationship status: Not on file  Other Topics Concern  . Not on file  Social History Narrative   Drinks caffeine daily    Additional Social History:                         Sleep: Good  Appetite:  Fair  Current Medications: Current Facility-Administered Medications  Medication Dose Route Frequency Provider Last Rate Last Dose  . acetaminophen (TYLENOL) tablet 650 mg  650 mg Oral Q6H PRN Laveda AbbeParks, Laurie Britton, NP   650 mg at 06/09/18 0946  . alum & mag hydroxide-simeth (MAALOX/MYLANTA) 200-200-20 MG/5ML suspension 30 mL  30 mL Oral Q4H PRN Laveda AbbeParks, Laurie Britton, NP      . amLODipine (NORVASC) tablet 10 mg  10 mg Oral Daily Laveda AbbeParks, Laurie Britton, NP   10 mg at 06/10/18 0820  . benztropine (COGENTIN) tablet 1 mg  1 mg Oral BID PRN Micheal Likensainville, Janessa Mickle T, MD       Or  . benztropine mesylate (COGENTIN) injection 1 mg  1 mg Intramuscular BID PRN Micheal Likensainville, Imunique Samad T, MD      . haloperidol (HALDOL) tablet 5 mg  5 mg Oral Q8H PRN Micheal Likensainville, Kylinn Shropshire T, MD   5 mg at 06/10/18 1120   Or  . haloperidol lactate (HALDOL) injection 5 mg  5 mg Intramuscular Q8H PRN Micheal Likensainville, Lissy Deuser T, MD      . hydrALAZINE (APRESOLINE) tablet 10 mg  10 mg Oral Q6H PRN Donell SievertSimon, Spencer E, PA-C   10 mg at 06/09/18 1656  . hydrALAZINE (APRESOLINE) tablet 100 mg  100 mg Oral Daily Laveda AbbeParks, Laurie Britton, NP   100 mg at 06/10/18 0820  . hydrochlorothiazide (MICROZIDE) capsule 12.5 mg  12.5 mg Oral Daily Laveda AbbeParks, Laurie Britton, NP   12.5 mg at 06/10/18 0820  . hydrOXYzine (ATARAX/VISTARIL) tablet 50 mg  50 mg Oral TID PRN Laveda AbbeParks, Laurie Britton, NP   50 mg at 06/09/18 1610  . LORazepam (ATIVAN) tablet 2 mg  2 mg Oral Q6H PRN Micheal Likensainville, Rhylin Venters T, MD       Or  . LORazepam (ATIVAN) injection 2 mg  2 mg Intramuscular Q6H PRN Micheal Likensainville, Mikhaela Zaugg T, MD      . magnesium hydroxide (MILK OF MAGNESIA) suspension  30 mL  30 mL Oral Daily PRN Laveda AbbeParks, Laurie Britton, NP      . OLANZapine [facility](ZYPREXA) tablet 15 mg  15 mg Oral QHS Oneta RackLewis, Tanika N, NP   15 mg at 06/09/18 2123  . Oxcarbazepine (TRILEPTAL) tablet 300 mg  300 mg Oral BID Laveda AbbeParks, Laurie Britton, NP   300 mg at 06/09/18 0849  . traZODone (DESYREL) tablet 100 mg  100 mg Oral QHS PRN Nira Conn A, NP   100 mg at 06/07/18 2230    Lab Results: No results found for this or any previous visit (from the past 48 hour(s)).  Blood Alcohol level:  Lab Results  Component Value Date   ETH <10 06/02/2018   ETH <10 05/31/2018    Metabolic Disorder Labs: Lab Results  Component Value Date   HGBA1C 5.6 01/09/2017   MPG 114 01/09/2017   Lab Results  Component Value Date   PROLACTIN 33.8 (H) 01/09/2017   Lab Results  Component Value Date   CHOL 198 01/09/2017   TRIG 128 01/09/2017   HDL 40 (L) 01/09/2017   CHOLHDL 5.0 01/09/2017   VLDL 26 01/09/2017   LDLCALC 132 (H) 01/09/2017    Physical Findings: AIMS: Facial and Oral Movements Muscles of Facial Expression: None, normal Lips and Perioral Area: None, normal Jaw: None, normal Tongue: None, normal,Extremity Movements Upper (arms, wrists, hands, fingers): None, normal Lower (legs, knees, ankles, toes): None, normal, Trunk Movements Neck, shoulders, hips: None, normal, Overall Severity Severity of abnormal movements (highest score from questions above): None, normal Incapacitation due to abnormal movements: None, normal Patient's awareness of abnormal movements (rate only patient's report): No Awareness, Dental Status Current problems with teeth and/or dentures?: No Does patient usually wear dentures?: No  CIWA:    COWS:     Musculoskeletal: Strength & Muscle Tone: within normal limits Gait & Station: normal Patient leans: N/A  Psychiatric Specialty Exam: Physical Exam  Nursing note and vitals reviewed.   Review of Systems  Constitutional: Negative for chills and fever.   Respiratory: Negative for cough and shortness of breath.   Cardiovascular: Negative for chest pain.  Gastrointestinal: Negative for abdominal pain, heartburn, nausea and vomiting.  Psychiatric/Behavioral: Positive for hallucinations. Negative for depression and suicidal ideas. The patient is nervous/anxious. The patient does not have insomnia.     Blood pressure (!) 173/109, pulse (!) 111, temperature 98.6 F (37 C), temperature source Oral, resp. rate 20, height 6\' 2"  (1.88 m), weight (!) 198.7 kg (438 lb), SpO2 97 %.Body mass index is 56.24 kg/m.  General Appearance: Casual and Disheveled  Eye Contact:  Good  Speech:  Clear and Coherent and Normal Rate  Volume:  Normal  Mood:  Anxious  Affect:  Blunt  Thought Process:  Coherent and Goal Directed  Orientation:  Full (Time, Place, and Person)  Thought Content:  Delusions and Hallucinations: Auditory  Suicidal Thoughts:  No  Homicidal Thoughts:  No  Memory:  Immediate;   Fair Recent;   Fair Remote;   Fair  Judgement:  Fair  Insight:  Fair  Psychomotor Activity:  Normal  Concentration:  Concentration: Fair  Recall:  [employer] of Knowledge:  Fair  Language:  Fair  Akathisia:  No  Handed:    AIMS (if indicated):     Assets:  Communication Skills Desire for Improvement Resilience Social Support  ADL's:  Intact  Cognition:  WNL  Sleep:  Number of Hours: 6.75    Treatment Plan Summary: Daily contact with patient to assess and evaluate symptoms and progress in treatment and Medication management   -Continue inpatient hospitalization  -Schizoaffective disorder, bipolar type  -Change zyprexa 15mg  po qhs to zyprexa 20mg  po qhs -Continue trileptal 300mg  po BID  - HTN  -Continue norvasc 10mg  po qDay -Continue hydralazine 100mg  po qDay -Continue HCTZ 12.5mg  po qDay  -EPS -Continuecogentin 1mg  po/IM q12h prn  EPS  -Agitation/psychosis -Continuehaldol 5mg   po/IM q8h prn psychosis/agitation -Continue ativan 2mg  po/IM q6h prn agitation/severe anxiety  -Anxiety -Continue vistaril 50mg  po q8h prnmildanxiety  -Insomnia -Continue trazodone 100mg  po qhs prn insomnia  -Encourage participation in groups and therapeutic milieu  -Disposition planning will be ongoing  Micheal Likens, MD 06/10/2018, 12:20 PM

## 2018-06-10 NOTE — [facility] Group Notes (Signed)
LCSW Group Therapy Note   06/10/2018 1:15pm   Type of Therapy and Topic:  Group Therapy:  Overcoming Obstacles   Participation Level:  Did Not Attend   Description of Group:    In this group patients will be encouraged to explore what they see as obstacles to their own wellness and recovery. They will be guided to discuss their thoughts, feelings, and behaviors related to these obstacles. The group will process together ways to cope with barriers, with attention given to specific choices patients can make. Each patient will be challenged to identify changes they are motivated to make in order to overcome their obstacles. This group will be process-oriented, with patients participating in exploration of their own experiences as well as giving and receiving support and challenge from other group members.   Therapeutic Goals: 1. Patient will identify personal and current obstacles as they relate to admission. 2. Patient will identify barriers that currently interfere with their wellness or overcoming obstacles.  3. Patient will identify feelings, thought process and behaviors related to these barriers. 4. Patient will identify two changes they are willing to make to overcome these obstacles:      Summary of Patient Progress      Therapeutic Modalities:   Cognitive Behavioral Therapy Solution Focused Therapy Motivational Interviewing Relapse Prevention Therapy  Allen RogueRodney B [name], LCSW 06/10/2018 3:22 PM

## 2018-06-10 NOTE — Plan of Care (Signed)
Problem: Safety: Goal: Periods of time without injury will increase Intervention: Patient contracts for safety on the unit. Low fall risk precautions in place. Safety monitored with q15 minute checks. Outcome: Patient remains safe on the unit at this time. 06/10/2018 2:47 PM - Progressing by Ferrel Loganollazo, Finbar Nippert A, RN

## 2018-06-10 NOTE — Progress Notes (Signed)
D: Pt was at nurse's station upon initial approach.  He presents with depressed affect and mood.  Pt describes his day as "all right" and denies having a goal tonight.  Writer and pt made goal for pt to "be safe and sleep well."  Pt reports he has been "trying to get my appetite back the past few days."  He reports he ate one meal today and he ate snacks tonight.  Denies SI/HI and pain.  Pt reports VH of "seeing visuals and stuff."  Pt has been isolative to his room for majority of the evening and he did not attend evening group.  A: Introduced self to pt.  Met with pt 1:1 and provided support and encouragement.  Medication administered per order.  Q15 minute safety checks maintained.  R: Pt is compliant with medication.  He verbally contracts for safety and reports he will inform staff of needs and concerns.  Will continue to monitor and assess.

## 2018-06-10 NOTE — Progress Notes (Signed)
Patient ID: Allen RenMichael Levey V., male   DOB: 1981-11-11, 37 y.o.   MRN: [id number]  DAR Note: Pt observed in room resting with eyes closed. Pt continue to be very anxious and suspicious. Pt endorsed moderate anxiety, depression and AH; "I can't really make out what they are saying."-denied command hallucinations. Pt denied SI/HI or pain. Pt was med compliant. All patient's questions and concerns addressed. Support, encouragement, and safe environment provided. 15-minute safety checks continue. Pt did not attend wrap-up group.

## 2018-06-10 NOTE — Progress Notes (Signed)
Recreation Therapy Notes  Date: 6.17.19 Time: 1000 Location: 500 Hall Dayroom  Group Topic: Triggers  Goal Area(s) Addresses:  Patient will be able to identify triggers.  Patient will identify problems caused by triggers. Patient will identify three coping skills to deal with triggers.  Intervention: Worksheet  Activity: Triggers.  LRT gave patients a worksheet in which patients were to identify their triggers, the problems their triggers contribute to and come up with a trigger for each category (emotional state, people, places, things, thoughts and activities/situations) presented.  Education:Communication, Discharge Planning  Education Outcome: Acknowledges understanding/In group clarification offered/Needs additional education.   Clinical Observations/Feedback:  Pt did not attend group.    Allen Sosa, LRT/CTRS         Allen Sosa A 06/10/2018 12:37 PM 

## 2018-06-10 NOTE — Tx Team (Signed)
Interdisciplinary Treatment and Diagnostic Plan Update  06/10/2018 Time of Session: 8:25 AM  Allen Sosa MRN: [id number]  Principal Diagnosis: Schizoaffective disorder, bipolar type ([place])  Secondary Diagnoses: Principal Problem:   Schizoaffective disorder, bipolar type ([place])   Current Medications:  Current Facility-Administered Medications  Medication Dose Route Frequency Provider Last Rate Last Dose  . acetaminophen (TYLENOL) tablet 650 mg  650 mg Oral Q6H PRN Ethelene Hal, NP   650 mg at 06/09/18 0946  . alum & mag hydroxide-simeth (MAALOX/MYLANTA) 200-200-20 MG/5ML suspension 30 mL  30 mL Oral Q4H PRN Ethelene Hal, NP      . amLODipine (NORVASC) tablet 10 mg  10 mg Oral Daily Ethelene Hal, NP   10 mg at 06/10/18 0820  . benztropine (COGENTIN) tablet 1 mg  1 mg Oral BID PRN Pennelope Bracken, MD       Or  . benztropine mesylate (COGENTIN) injection 1 mg  1 mg Intramuscular BID PRN Pennelope Bracken, MD      . haloperidol (HALDOL) tablet 5 mg  5 mg Oral Q8H PRN Pennelope Bracken, MD       Or  . haloperidol lactate (HALDOL) injection 5 mg  5 mg Intramuscular Q8H PRN Pennelope Bracken, MD      . hydrALAZINE (APRESOLINE) tablet 10 mg  10 mg Oral Q6H PRN Patriciaann Clan E, PA-C   10 mg at 06/09/18 1656  . hydrALAZINE (APRESOLINE) tablet 100 mg  100 mg Oral Daily Ethelene Hal, NP   100 mg at 06/10/18 0820  . hydrochlorothiazide (MICROZIDE) capsule 12.5 mg  12.5 mg Oral Daily Ethelene Hal, NP   12.5 mg at 06/10/18 0820  . hydrOXYzine (ATARAX/VISTARIL) tablet 50 mg  50 mg Oral TID PRN Ethelene Hal, NP   50 mg at 06/09/18 1610  . LORazepam (ATIVAN) tablet 2 mg  2 mg Oral Q6H PRN Pennelope Bracken, MD       Or  . LORazepam (ATIVAN) injection 2 mg  2 mg Intramuscular Q6H PRN Pennelope Bracken, MD      . magnesium hydroxide (MILK OF MAGNESIA) suspension 30 mL  30 mL Oral Daily PRN Ethelene Hal, NP      . OLANZapine [facility]) tablet 15 mg  15 mg Oral QHS Derrill Center, NP   15 mg at 06/09/18 2123  . Oxcarbazepine (TRILEPTAL) tablet 300 mg  300 mg Oral BID Ethelene Hal, NP   300 mg at 06/09/18 0849  . traZODone (DESYREL) tablet 100 mg  100 mg Oral QHS PRN Lindon Romp A, NP   100 mg at 06/07/18 2230    PTA Medications: Medications Prior to Admission  Medication Sig Dispense Refill Last Dose  . amLODipine (NORVASC) 10 MG tablet Take 1 tablet (10 mg total) by mouth daily. 30 tablet 1 06/02/2018 at Unknown time  . clonazePAM (KLONOPIN) 2 MG tablet Take 2 mg by mouth 3 (three) times daily.   05/31/2018  . hydrALAZINE (APRESOLINE) 100 MG tablet Take 100 mg by mouth daily.  1 06/02/2018 at Unknown time  . hydrochlorothiazide (MICROZIDE) 12.5 MG capsule Take 1 capsule (12.5 mg total) by mouth daily. 30 capsule 1 06/02/2018 at Unknown time  . ibuprofen (ADVIL,MOTRIN) 200 MG tablet Take 200 mg by mouth every 6 (six) hours as needed for fever, headache, mild pain, moderate pain or cramping.   06/02/2018 at Unknown time  . Multiple Vitamin (MULTIVITAMIN WITH MINERALS) TABS tablet Take 1  tablet by mouth 2 (two) times daily.   06/02/2018 at Unknown time  . OLANZapine (ZYPREXA) 10 MG tablet Take 10 mg by mouth at bedtime.   2 weeks ago    Patient Stressors: Financial difficulties Marital or family conflict Medication change or noncompliance Occupational concerns  Patient Strengths: Ability for insight Average or above average intelligence Capable of independent living General fund of knowledge  Treatment Modalities: Medication Management, Group therapy, Case management,  1 to 1 session with clinician, Psychoeducation, Recreational therapy.   Physician Treatment Plan for Primary Diagnosis: Schizoaffective disorder, bipolar type ([place]) Long Term Goal(s): Improvement in symptoms so as ready for discharge  Short Term Goals: Ability to identify and develop effective coping behaviors  will improve Ability to maintain clinical measurements within normal limits will improve  Medication Management: Evaluate patient's response, side effects, and tolerance of medication regimen.  Therapeutic Interventions: 1 to 1 sessions, Unit Group sessions and Medication administration.  Evaluation of Outcomes: Progressing   6/17: Pt reports some difficulty with ongoing AH which he describes as intensity of "8/10." He denies SI/HI/VH. He is sleeping adequately. His appetite is good. He is tolerating his medications well. Discussed with patient about treatment options. To address ongoing AH and derealization symptoms we will plan to increase dose of zyprexa at bedtime, and pt was in agreement with this plan. -Schizoaffective disorder, bipolar type  -Change zyprexa 34m po qhs to zyprexa 225mpo qhs -Continue trileptal 30064mo BID  - HTN  -Continue norvasc 64m52m qDay -Continue hydralazine 100mg39mqDay -Continue HCTZ 12.5mg p1mDay  -EPS -Continuecogentin 1mg po60m q12h prn EPS     Physician Treatment Plan for Secondary Diagnosis: Principal Problem:   Schizoaffective disorder, bipolar type (HCC)   [place] Term Goal(s): Improvement in symptoms so as ready for discharge  Short Term Goals: Ability to identify and develop effective coping behaviors will improve Ability to maintain clinical measurements within normal limits will improve  Medication Management: Evaluate patient's response, side effects, and tolerance of medication regimen.  Therapeutic Interventions: 1 to 1 sessions, Unit Group sessions and Medication administration.  Evaluation of Outcomes: Progressing   RN Treatment Plan for Primary Diagnosis: Schizoaffective disorder, bipolar type (HCC) [place] Goal(s): Knowledge of disease and therapeutic regimen to maintain health will improve  Short Term Goals: Ability to identify and develop  effective coping behaviors will improve and Compliance with prescribed medications will improve  Medication Management: RN will administer medications as ordered by provider, will assess and evaluate patient's response and provide education to patient for prescribed medication. RN will report any adverse and/or side effects to prescribing provider.  Therapeutic Interventions: 1 on 1 counseling sessions, Psychoeducation, Medication administration, Evaluate responses to treatment, Monitor vital signs and CBGs as ordered, Perform/monitor CIWA, COWS, AIMS and Fall Risk screenings as ordered, Perform wound care treatments as ordered.  Evaluation of Outcomes: Progressing   LCSW Treatment Plan for Primary Diagnosis: Schizoaffective disorder, bipolar type (HCC) [place] Goal(s): Safe transition to appropriate next level of care at discharge, Engage patient in therapeutic group addressing interpersonal concerns.  Short Term Goals: Engage patient in aftercare planning with referrals and resources  Therapeutic Interventions: Assess for all discharge needs, 1 to 1 time with Social worker, Explore available resources and support systems, Assess for adequacy in community support network, Educate family and significant other(s) on suicide prevention, Complete Psychosocial Assessment, Interpersonal group therapy.  Evaluation of Outcomes: Met  D[facility] shelter[facility]  Progress in Treatment: Attending groups:Intermittently Participating in groups: Minimally Taking medication as prescribed: Yes Toleration medication: Yes, no side effects reported at this time Family/Significant other contact made: No Patient understands diagnosis: Yes AEB asking for help with multiple mental health symptoms Discussing patient identified problems/goals with staff: Yes Medical problems stabilized or resolved: Yes Denies suicidal/homicidal ideation: Yes Issues/concerns per patient  self-inventory: None Other: N/A  New problem(s) identified: None identified at this time.   New Short Term/Long Term Goal(s): "I want to be stable on my medications, and then find resources to help me with being homeless."   Discharge Plan or Barriers:   Reason for Continuation of Hospitalization: Anxiety Hallucinations  Medication stabilization   Estimated Length of Stay: 6/21  Attendees: Patient:  06/10/2018  8:25 AM  Physician: Maris Berger, MD 06/10/2018  8:25 AM  Nursing: Baldo Daub RN 06/10/2018  8:25 AM  RN Care Manager: Lars Pinks, RN 06/10/2018  8:25 AM  Social Worker: Ripley Fraise 06/10/2018  8:25 AM  Recreational Therapist: Winfield Cunas 06/10/2018  8:25 AM  Other: Norberto Sorenson 06/10/2018  8:25 AM  Other:  06/10/2018  8:25 AM    Scribe for Treatment Team:  Roque Lias LCSW 06/10/2018 8:25 AM

## 2018-06-11 MED ORDER — HALOPERIDOL 5 MG PO TABS
5.0000 mg | ORAL_TABLET | Freq: Two times a day (BID) | ORAL | Status: DC
  Administered 2018-06-11 – 2018-06-12 (×4): 5 mg via ORAL
  Filled 2018-06-11 (×6): qty 1

## 2018-06-11 NOTE — Progress Notes (Signed)
Adult Psychoeducational Group Note  Date:  06/11/2018 Time:  8:44 PM  Group Topic/Focus:  Wrap-Up Group:   The focus of this group is to help patients review their daily goal of treatment and discuss progress on daily workbooks.  Participation Level:  Active  Participation Quality:  Appropriate  Affect:  Appropriate  Cognitive:  Appropriate  Insight: Appropriate  Engagement in Group:  Engaged  Modes of Intervention:  Discussion  Additional Comments: The patient expressed he rates today a 8.The patient also said that he attended group and reached his goal to feel better.  Octavio Mannshigpen, Monnie Gudgel Lee 06/11/2018, 8:44 PM

## 2018-06-11 NOTE — Progress Notes (Signed)
[facility] MD Progress Note  06/11/2018 2:45 PM Allen Sosa  MRN:  [id number] Subjective:    Allen Sosa is a 37 y/o M with history of schizoaffective disorder bipolar type who was admitted voluntarily from [facility] with worsening symptoms of depression, SI with plan to lie in the road, and worsening AH. Pt was medically cleared and then transferred to [facility] for additional treatment and stabilization.Pt had been receiving [place] injections as an outpatient, but he did not feel that they were helpful for his symptoms, so he was transitioned to regimen of trileptal and abilify.Pt reported ongoing symptoms of AH, paranoia, and anxiety, and he shared previous efficacy for 5 years while on haldol, so he was changed from abilify to haldol; however, he then later requested to be changed to zyprexa, and that dose has been titrated up during his stay.He has been reporting incremental improvement of his presenting symptoms.  Today upon evaluation, pt shares, "I'm doing so-so - maybe about the same." Pt continues to endorse SI that "comes and goes" but he has no specific plan, and he is able to contract for safety while in the hospital. He denies HI. He endorses AH and rates them as "8-9/10" in intensity today. He endorses VH of "pictures and stuff." He denies physical complaints. He is sleeping well. His appetite is good. He is tolerating his medications well overall, and he denies side effects. Discussed with patient about treatment options. He requests for addition of latuda to his regimen, and discussed with patient that he may have difficulty obtaining latuda as an outpatient due to high cost. Reviewed alternative of addition of haldol to his current regimen including the risks, benefits, and alternatives of treating with multiple antipsychotic medications including increased risk of side effects such as EPS or metabolic side effects, and pt was in agreement to attempt trial of haldol in addition to current  medication of haldol. Discussed with patient about potential discharge plan to stay in a shelter in the [place] area in the coming days, and pt was in agreement with that plan. He had no further questions, comments, or concerns.  Principal Problem: Schizoaffective disorder, bipolar type (HCC) Diagnosis:   Patient Active Problem List   Diagnosis Date Noted  . Auditory hallucinations [R44.0]   . Depression [F32.9]   . Suicidal ideation [R45.851]   . Hypertension [I10] 01/09/2017  . Obesity [E66.9] 01/09/2017  . Schizoaffective disorder, bipolar type (HCC) [F25.0] 01/07/2017  . 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 Abdominal pain [R10.9] 11/20/2014  . Abdominal pain in male [R10.9] 11/18/2014   Total Time spent with patient: 30 minutes  Past Psychiatric History: see H&P  Past Medical History:  Past Medical History:  Diagnosis Date  . Anxiety   . Depression   . Hyperlipemia   . Hypertension   . Obesity   . Schizo affective schizophrenia (HCC)   . Sleep apnea     Past Surgical History:  Procedure Laterality Date  . VASECTOMY     Family History:  Family History  Problem Relation Age of Onset  . Heart attack Mother   . Cancer Mother   . Cancer Sister    Family Psychiatric  History: see H&P Social History:  Social History   Substance and Sexual Activity  Alcohol Use No  . Alcohol/week: 0.0 oz     Social History   Substance and Sexual Activity  Drug Use No    Social History  Socioeconomic History  . Marital status: Married    Spouse name: Not on file  . Number of children: 1  . Years of education: 1011  . Highest education level: Not on file  Occupational History  . Occupation: [employer]  Social Needs  . Financial resource strain: Not on file  . Food insecurity:    Worry: Not on file    Inability: Not on file  . Transportation needs:    Medical: Not on file    Non-medical: Not on file  Tobacco Use  .  Smoking status: Never Smoker  . Smokeless tobacco: Never Used  Substance and Sexual Activity  . Alcohol use: No    Alcohol/week: 0.0 oz  . Drug use: No  . Sexual activity: Yes    Birth control/protection: None  Lifestyle  . Physical activity:    Days per week: Not on file    Minutes per session: Not on file  . Stress: Not on file  Relationships  . Social connections:    Talks on phone: Not on file    Gets together: Not on file    Attends religious service: Not on file    Active member of club or organization: Not on file    Attends meetings of clubs or organizations: Not on file    Relationship status: Not on file  Other Topics Concern  . Not on file  Social History Narrative   Drinks caffeine daily    Additional Social History:                         Sleep: Good  Appetite:  Good  Current Medications: Current Facility-Administered Medications  Medication Dose Route Frequency Provider Last Rate Last Dose  . acetaminophen (TYLENOL) tablet 650 mg  650 mg Oral Q6H PRN Laveda AbbeParks, Laurie Britton, NP   650 mg at 06/11/18 1105  . alum & mag hydroxide-simeth (MAALOX/MYLANTA) 200-200-20 MG/5ML suspension 30 mL  30 mL Oral Q4H PRN Laveda AbbeParks, Laurie Britton, NP      . amLODipine (NORVASC) tablet 10 mg  10 mg Oral Daily Laveda AbbeParks, Laurie Britton, NP   10 mg at 06/11/18 0933  . benztropine (COGENTIN) tablet 1 mg  1 mg Oral BID PRN Micheal Likensainville, Necha Harries T, MD       Or  . benztropine mesylate (COGENTIN) injection 1 mg  1 mg Intramuscular BID PRN Micheal Likensainville, Gaudencio Chesnut T, MD      . haloperidol (HALDOL) tablet 5 mg  5 mg Oral Q8H PRN Micheal Likensainville, Ranon Coven T, MD   5 mg at 06/10/18 1120   Or  . haloperidol lactate (HALDOL) injection 5 mg  5 mg Intramuscular Q8H PRN Micheal Likensainville, Corene Resnick T, MD      . haloperidol (HALDOL) tablet 5 mg  5 mg Oral BID Micheal Likensainville, Quan Cybulski T, MD   5 mg at 06/11/18 1209  . hydrALAZINE (APRESOLINE) tablet 10 mg  10 mg Oral Q6H PRN Kerry HoughSimon, Spencer E, PA-C   10 mg  at 06/09/18 1656  . hydrALAZINE (APRESOLINE) tablet 100 mg  100 mg Oral Daily Laveda AbbeParks, Laurie Britton, NP   100 mg at 06/11/18 0932  . hydrochlorothiazide (MICROZIDE) capsule 12.5 mg  12.5 mg Oral Daily Laveda AbbeParks, Laurie Britton, NP   12.5 mg at 06/11/18 [id number]  . hydrOXYzine (ATARAX/VISTARIL) tablet 50 mg  50 mg Oral TID PRN Laveda AbbeParks, Laurie Britton, NP   50 mg at 06/11/18 1443  . LORazepam (ATIVAN) tablet 2 mg  2 mg Oral Q6H PRN  Micheal Likens, MD       Or  . LORazepam (ATIVAN) injection 2 mg  2 mg Intramuscular Q6H PRN Micheal Likens, MD      . magnesium hydroxide (MILK OF MAGNESIA) suspension 30 mL  30 mL Oral Daily PRN Laveda Abbe, NP      . OLANZapine [facility]) tablet 15 mg  15 mg Oral QHS [name], NP   15 mg at 06/10/18 2110  . Oxcarbazepine (TRILEPTAL) tablet 300 mg  300 mg Oral BID Laveda Abbe, NP   300 mg at 06/11/18 0932  . traZODone (DESYREL) tablet 100 mg  100 mg Oral QHS PRN Nira Conn A, NP   100 mg at 06/07/18 2230    Lab Results: No results found for this or any previous visit (from the past 48 hour(s)).  Blood Alcohol level:  Lab Results  Component Value Date   ETH <10 06/02/2018   ETH <10 05/31/2018    Metabolic Disorder Labs: Lab Results  Component Value Date   HGBA1C 5.6 01/09/2017   MPG 114 01/09/2017   Lab Results  Component Value Date   PROLACTIN 33.8 (H) 01/09/2017   Lab Results  Component Value Date   CHOL 198 01/09/2017   TRIG 128 01/09/2017   HDL 40 (L) 01/09/2017   CHOLHDL 5.0 01/09/2017   VLDL 26 01/09/2017   LDLCALC 132 (H) 01/09/2017    Physical Findings: AIMS: Facial and Oral Movements Muscles of Facial Expression: None, normal Lips and Perioral Area: None, normal Jaw: None, normal Tongue: None, normal,Extremity Movements Upper (arms, wrists, hands, fingers): None, normal Lower (legs, knees, ankles, toes): None, normal, Trunk Movements Neck, shoulders, hips: None, normal, Overall  Severity Severity of abnormal movements (highest score from questions above): None, normal Incapacitation due to abnormal movements: None, normal Patient's awareness of abnormal movements (rate only patient's report): No Awareness, Dental Status Current problems with teeth and/or dentures?: No Does patient usually wear dentures?: No  CIWA:    COWS:     Musculoskeletal: Strength & Muscle Tone: within normal limits Gait & Station: normal Patient leans: N/A  Psychiatric Specialty Exam: Physical Exam  Nursing note and vitals reviewed.   Review of Systems  Constitutional: Negative for chills and fever.  Respiratory: Negative for cough and shortness of breath.   Cardiovascular: Negative for chest pain.  Gastrointestinal: Negative for abdominal pain, heartburn, nausea and vomiting.  Psychiatric/Behavioral: Positive for depression, hallucinations and suicidal ideas. The patient is nervous/anxious. The patient does not have insomnia.     Blood pressure (!) 136/102, pulse (!) 119, temperature 98.7 F (37.1 C), temperature source Oral, resp. rate 16, height 6\' 2"  (1.88 m), weight (!) 198.7 kg (438 lb), SpO2 97 %.Body mass index is 56.24 kg/m.  General Appearance: Casual and Disheveled  Eye Contact:  Good  Speech:  Clear and Coherent and Normal Rate  Volume:  Normal  Mood:  Anxious and Depressed  Affect:  Appropriate, Congruent, Constricted, Depressed and Flat  Thought Process:  Coherent and Goal Directed  Orientation:  Full (Time, Place, and Person)  Thought Content:  Hallucinations: Auditory Visual  Suicidal Thoughts:  Yes.  without intent/plan  Homicidal Thoughts:  No  Memory:  Immediate;   Fair Recent;   Fair Remote;   Fair  Judgement:  Fair  Insight:  Lacking  Psychomotor Activity:  Normal  Concentration:  Concentration: Fair  Recall:  [employer] of Knowledge:  Fair  Language:  Fair  Akathisia:  No  Handed:    AIMS (if indicated):     Assets:  Communication  Skills Resilience Social Support  ADL's:  Intact  Cognition:  WNL  Sleep:  Number of Hours: 6.25   Treatment Plan Summary: Daily contact with patient to assess and evaluate symptoms and progress in treatment and Medication management   -Continue inpatient hospitalization  -Schizoaffective disorder, bipolar type  -Continue zyprexa 20mg  po qhs   -Start haldol 5mg  po BID -Continue trileptal 300mg  po BID  - HTN  -Continue norvasc 10mg  po qDay -Continue hydralazine 100mg  po qDay -Continue HCTZ 12.5mg  po qDay  -EPS -Continuecogentin 1mg  po/IM q12h prn EPS  -Agitation/psychosis -Continuehaldol 5mg  po/IM q8h prn psychosis/agitation -Continue ativan 2mg  po/IM q6h prn agitation/severe anxiety  -Anxiety -Continue vistaril 50mg  po q8h prnmildanxiety  -Insomnia -Continue trazodone 100mg  po qhs prn insomnia  -Encourage participation in groups and therapeutic milieu  -Disposition planning will be ongoing  Micheal Likens, MD 06/11/2018, 2:45 PM

## 2018-06-11 NOTE — Plan of Care (Signed)
  Problem: Safety: Goal: Periods of time without injury will increase Outcome: Progressing   Problem: Self-Concept: Goal: Level of anxiety will decrease Outcome: Progressing   Problem: Medication: Goal: Compliance with prescribed medication regimen will improve Outcome: Progressing   Problem: Education: Goal: Will be free of psychotic symptoms Outcome: Progressing  DAR NOTE: Patient presents with calm affect and pleasant mood.  Denies suicidal thoughts, auditory and visual hallucinations.  Rates depression at 5, hopelessness at 1, and anxiety at 0.  Maintained on routine safety checks.  Medications given as prescribed.  Support and encouragement offered as needed.  Attended group and participated.  States goal for today is "manage meds."  Patient observed socializing with peers in the dayroom.  Offered no complaint.

## 2018-06-11 NOTE — Progress Notes (Signed)
Adult Psychoeducational Group Note  Date:  06/11/2018 Time:  12:42 AM  Group Topic/Focus:  Wrap-Up Group:   The focus of this group is to help patients review their daily goal of treatment and discuss progress on daily workbooks.  Participation Level:  Did Not Attend  Participation Quality:  Did Not Attend  Affect:  Did Not Attend  Cognitive:  Did Not Attend  Insight: None  Engagement in Group:  Did Not Attend  Modes of Intervention:  Did Not Attend  Additional Comments: Pt did not attend evening wrap up group tonight.  Allen FurnaceChristopher  [name] 06/11/2018, 12:42 AM

## 2018-06-11 NOTE — Progress Notes (Signed)
Recreation Therapy Notes  Date: 6.18.19 Time: 1000 Location: [address] Dayroom   Group Topic: Communication, Team Building, Problem Solving  Goal Area(s) Addresses:  Patient will effectively work with peer towards shared goal.  Patient will identify skills used to make activity successful.  Patient will identify how skills used during activity can be used to reach post d/c goals.   Intervention: STEM Activity  Activity: Stage managerLanding Pad. In teams patients were given 12 plastic drinking straws and a length of masking tape. Using the materials provided patients were asked to build a landing pad to catch a golf ball dropped from approximately 6 feet in the air.   Education: Pharmacist, communityocial Skills, Discharge Planning   Education Outcome: Acknowledges education/In group clarification offered/Needs additional education.   Clinical Observations/Feedback: Pt did not attend group.    Caroll RancherMarjette Donnel Venuto, LRT/CTRS         Lillia AbedLindsay, Erielle Gawronski A 06/11/2018 11:58 AM

## 2018-06-11 NOTE — Progress Notes (Signed)
D: Pt was in the dayroom upon initial approach.  He presents with depressed affect and mood.  When asked about his day, he states "I had a rough start, but it picked up as the day went on."  His goal is to "feel better than earlier" and pt reports he has accomplished this.  Pt denies SI/HI, hallucinations, and pain.  He reports having "paranoia, very bad."  Pt attended group tonight and he has interacted with peers and staff appropriately.  A: Met with pt 1:1 and provided support and encouragement.  Medication administered per order.  PRN medication administered for sleep per request.  Q15 minute safety checks maintained.  R: Pt is compliant with medications.  He verbally contracts for safety and reports he will inform staff of needs and concerns.  Will continue to monitor and assess.

## 2018-06-11 NOTE — [facility] Group Notes (Signed)
LCSW Group Therapy Note   06/11/2018 1:15pm   Type of Therapy and Topic:  Group Therapy:  Positive Affirmations   Participation Level:  Minimal  Description of Group: This group addressed positive affirmation toward self and others. Patients went around the room and identified two positive things about themselves and two positive things about a peer in the room. Patients reflected on how it felt to share something positive with others, to identify positive things about themselves, and to hear positive things from others. Patients were encouraged to have a daily reflection of positive characteristics or circumstances.  Therapeutic Goals 1. Patient will verbalize two of their positive qualities 2. Patient will demonstrate empathy for others by stating two positive qualities about a peer in the group 3. Patient will verbalize their feelings when voicing positive self affirmations and when voicing positive affirmations of others 4. Patients will discuss the potential positive impact on their wellness/recovery of focusing on positive traits of self and others. Summary of Patient Progress:  In and out of room multiple times.  Did not contribute, and dodged questions when asked directly.  "I don't know."  "I wasn't paying attention."  "I agree with her."  Therapeutic Modalities Cognitive Behavioral Therapy Motivational Interviewing  Allen RogueRodney B Andretta Ergle, LCSW 06/11/2018 2:45 PM

## 2018-06-12 NOTE — Progress Notes (Signed)
Recreation Therapy Notes  Date: 6.19.19 Time: 1000 Location: 500 [address] Dayroom  Group Topic: Leisure Education, Goal Setting  Goal Area(s) Addresses:  Patient will be able to identify at least 3 life goals.  Patient will be able to identify benefit of investing in life goals.  Patient will be able to identify benefit of setting life goals.   Intervention: Worksheet  Activity: Setting Life Goals.  Patients were to identify what they were doing well, what they needed to improve and set a goal in the areas of family, friends, spirituality, work/school, mental health and body.  Patients would then share their top 4 categories with the group.  Education: Discharge Planning, PharmacologistCoping Skills, Leisure Education  Education Outcome: Acknowledges Education/In Group Clarification Provided/Needs Additional Education  Clinical Observations: Pt did not attend group.     Caroll RancherMarjette Miko Sirico, LRT/CTRS         Lillia AbedLindsay, Aruna Nestler A 06/12/2018 1:05 PM

## 2018-06-12 NOTE — Progress Notes (Signed)
[facility] MD Progress Note  06/12/2018 2:53 PM Allen Sosa  MRN:  [id number] Subjective:    Allen Sosa is a 37 y/o M with history of schizoaffective disorder bipolar type who was admitted voluntarily from [facility] with worsening symptoms of depression, SI with plan to lie in the road, and worsening AH. Pt was medically cleared and then [facility] treatment and [facility].Pt had been receiving [place] injections as an outpatient, but he did not feel that they were helpful for his symptoms, so he was transitioned to regimen of trileptal and abilify.Pt reported ongoing symptoms of AH, paranoia, and anxiety, and he shared previous efficacy for 5 years while on haldol, so he was changed from abilify to haldol; however, he then later requested to be changed to zyprexa, and that dose has been titrated up during his stay. Pt continued to report paranoia, AH, and VH after titration dose of zyprexa to 20mg /day, and so he was started on additional antipsychotic of haldol to address ongoing symptoms.He has been reporting incremental improvement of his presenting symptoms.  Today upon evaluation, pt shares, "It's going okay. I'm still feeling paranoid. Last night was rough. I feel like people are trying to hurt me." Pt reports VH of "visuals of people." He reports that AH have improved somewhat but they are still present. He denies SI/HI. He denies physical complaints. He is sleeping well. His appetite is good. Discussed with patient about expectations for improvement of his symptoms, and how he may have some ongoing symptoms at time of discharge as long as he is able to keep himself safe, and pt verbalized good understanding. He is in agreement to continue his current medication regimen without changes. Discussed with patient about potential plan to discharge him to outpatient level of care tomorrow, and he was in agreement. SW team will assist him in referral to shelter in the area.  Pt was in agreement with the above plan, and he had no further questions, comments, or concerns.   Principal Problem: Schizoaffective disorder, bipolar type (HCC) Diagnosis:   Patient Active Problem List   Diagnosis Date Noted  . Auditory hallucinations [R44.0]   . Depression [F32.9]   . Suicidal ideation [R45.851]   . Hypertension [I10] 01/09/2017  . Obesity [E66.9] 01/09/2017  . Schizoaffective disorder, bipolar type (HCC) [F25.0] 01/07/2017  . 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 Abdominal pain [R10.9] 11/20/2014  . Abdominal pain in male [R10.9] 11/18/2014   Total Time spent with patient: 30 minutes  Past Psychiatric History: see H&P  Past Medical History:  Past Medical History:  Diagnosis Date  . Anxiety   . Depression   . Hyperlipemia   . Hypertension   . Obesity   . Schizo affective schizophrenia (HCC)   . Sleep apnea     Past Surgical History:  Procedure Laterality Date  . VASECTOMY     Family History:  Family History  Problem Relation Age of Onset  . Heart attack Mother   . Cancer Mother   . Cancer Sister    Family Psychiatric  History: see H&P Social History:  Social History   Substance and Sexual Activity  Alcohol Use No  . Alcohol/week: 0.0 oz     Social History   Substance and Sexual Activity  Drug Use No    Social History   Socioeconomic History  . Marital status: Married    Spouse name: Not on file  . Number of  children: 1  . Years of education: 65  . Highest education level: Not on file  Occupational History  . Occupation: [employer]  Social Needs  . Financial resource strain: Not on file  . Food insecurity:    Worry: Not on file    Inability: Not on file  . Transportation needs:    Medical: Not on file    Non-medical: Not on file  Tobacco Use  . Smoking status: Never Smoker  . Smokeless tobacco: Never Used  Substance and Sexual Activity  . Alcohol use: No    Alcohol/week:  0.0 oz  . Drug use: No  . Sexual activity: Yes    Birth control/protection: None  Lifestyle  . Physical activity:    Days per week: Not on file    Minutes per session: Not on file  . Stress: Not on file  Relationships  . Social connections:    Talks on phone: Not on file    Gets together: Not on file    Attends religious service: Not on file    Active member of club or organization: Not on file    Attends meetings of clubs or organizations: Not on file    Relationship status: Not on file  Other Topics Concern  . Not on file  Social History Narrative   Drinks caffeine daily    Additional Social History:                         Sleep: Fair  Appetite:  Good  Current Medications: Current Facility-Administered Medications  Medication Dose Route Frequency Provider Last Rate Last Dose  . acetaminophen (TYLENOL) tablet 650 mg  650 mg Oral Q6H PRN Laveda Abbe, NP   650 mg at 06/11/18 1105  . alum & mag hydroxide-simeth (MAALOX/MYLANTA) 200-200-20 MG/5ML suspension 30 mL  30 mL Oral Q4H PRN Laveda Abbe, NP      . amLODipine (NORVASC) tablet 10 mg  10 mg Oral Daily Laveda Abbe, NP   10 mg at 06/12/18 1021  . benztropine (COGENTIN) tablet 1 mg  1 mg Oral BID PRN Micheal Likens, MD       Or  . benztropine mesylate (COGENTIN) injection 1 mg  1 mg Intramuscular BID PRN Micheal Likens, MD      . haloperidol (HALDOL) tablet 5 mg  5 mg Oral Q8H PRN Micheal Likens, MD   5 mg at 06/10/18 1120   Or  . haloperidol lactate (HALDOL) injection 5 mg  5 mg Intramuscular Q8H PRN Micheal Likens, MD      . haloperidol (HALDOL) tablet 5 mg  5 mg Oral BID Micheal Likens, MD   5 mg at 06/12/18 1021  . hydrALAZINE (APRESOLINE) tablet 10 mg  10 mg Oral Q6H PRN Donell Sievert E, PA-C   10 mg at 06/09/18 1656  . hydrALAZINE (APRESOLINE) tablet 100 mg  100 mg Oral Daily Laveda Abbe, NP   100 mg at 06/12/18 1021   . hydrochlorothiazide (MICROZIDE) capsule 12.5 mg  12.5 mg Oral Daily Laveda Abbe, NP   12.5 mg at 06/12/18 1021  . hydrOXYzine (ATARAX/VISTARIL) tablet 50 mg  50 mg Oral TID PRN Laveda Abbe, NP   50 mg at 06/11/18 1443  . LORazepam (ATIVAN) tablet 2 mg  2 mg Oral Q6H PRN Micheal Likens, MD       Or  . LORazepam (ATIVAN) injection 2 mg  2 mg Intramuscular Q6H PRN Micheal Likens, MD      . magnesium hydroxide (MILK OF MAGNESIA) suspension 30 mL  30 mL Oral Daily PRN Laveda Abbe, NP      . OLANZapine [facility]) tablet 15 mg  15 mg Oral QHS [name], NP   15 mg at 06/11/18 2231  . Oxcarbazepine (TRILEPTAL) tablet 300 mg  300 mg Oral BID Laveda Abbe, NP   300 mg at 06/12/18 1020  . traZODone (DESYREL) tablet 100 mg  100 mg Oral QHS PRN Nira Conn A, NP   100 mg at 06/11/18 2231    Lab Results: No results found for this or any previous visit (from the past 48 hour(s)).  Blood Alcohol level:  Lab Results  Component Value Date   ETH <10 06/02/2018   ETH <10 05/31/2018    Metabolic Disorder Labs: Lab Results  Component Value Date   HGBA1C 5.6 01/09/2017   MPG 114 01/09/2017   Lab Results  Component Value Date   PROLACTIN 33.8 (H) 01/09/2017   Lab Results  Component Value Date   CHOL 198 01/09/2017   TRIG 128 01/09/2017   HDL 40 (L) 01/09/2017   CHOLHDL 5.0 01/09/2017   VLDL 26 01/09/2017   LDLCALC 132 (H) 01/09/2017    Physical Findings: AIMS: Facial and Oral Movements Muscles of Facial Expression: None, normal Lips and Perioral Area: None, normal Jaw: None, normal Tongue: None, normal,Extremity Movements Upper (arms, wrists, hands, fingers): None, normal Lower (legs, knees, ankles, toes): None, normal, Trunk Movements Neck, shoulders, hips: None, normal, Overall Severity Severity of abnormal movements (highest score from questions above): None, normal Incapacitation due to abnormal movements: None,  normal Patient's awareness of abnormal movements (rate only patient's report): No Awareness, Dental Status Current problems with teeth and/or dentures?: No Does patient usually wear dentures?: No  CIWA:    COWS:     Musculoskeletal: Strength & Muscle Tone: within normal limits Gait & Station: normal Patient leans: N/A  Psychiatric Specialty Exam: Physical Exam  Nursing note and vitals reviewed.   Review of Systems  Constitutional: Negative for chills and fever.  Respiratory: Negative for cough and shortness of breath.   Cardiovascular: Negative for chest pain.  Gastrointestinal: Negative for abdominal pain, heartburn, nausea and vomiting.  Psychiatric/Behavioral: Positive for depression and hallucinations. Negative for suicidal ideas. The patient is nervous/anxious. The patient does not have insomnia.     Blood pressure (!) 160/99, pulse 76, temperature 98.7 F (37.1 C), temperature source Oral, resp. rate 16, height 6\' 2"  (1.88 m), weight (!) 198.7 kg (438 lb), SpO2 97 %.Body mass index is 56.24 kg/m.  General Appearance: Casual and Fairly Groomed  Eye Contact:  Good  Speech:  Clear and Coherent and Normal Rate  Volume:  Normal  Mood:  Anxious  Affect:  Appropriate, Congruent and Constricted  Thought Process:  Coherent and Goal Directed  Orientation:  Full (Time, Place, and Person)  Thought Content:  Hallucinations: Auditory Visual and Paranoid Ideation  Suicidal Thoughts:  No  Homicidal Thoughts:  No  Memory:  Immediate;   Fair Recent;   Fair Remote;   Fair  Judgement:  Fair  Insight:  Lacking  Psychomotor Activity:  Normal  Concentration:  Concentration: Fair  Recall:  [employer] of Knowledge:  Fair  Language:  Fair  Akathisia:  No  Handed:    AIMS (if indicated):     Assets:  Desire for Improvement Resilience  ADL's:  Intact  Cognition:  WNL  Sleep:  Number of Hours: 5.25   Treatment Plan Summary: Daily contact with patient to assess and evaluate symptoms  and progress in treatment and Medication management    -Continue inpatient hospitalization  -Schizoaffective disorder, bipolar type  -Continue zyprexa 20mg  po qhs             -Continue haldol 5mg  po BID -Continue trileptal 300mg  po BID  - HTN  -Continue norvasc 10mg  po qDay -Continue hydralazine 100mg  po qDay -Continue HCTZ 12.5mg  po qDay  -EPS -Continuecogentin 1mg  po/IM q12h prn EPS  -Agitation/psychosis -Continuehaldol 5mg  po/IM q8h prn psychosis/agitation -Continue ativan 2mg  po/IM q6h prn agitation/severe anxiety  -Anxiety -Continue vistaril 50mg  po q8h prnmildanxiety  -Insomnia -Continue trazodone 100mg  po qhs prn insomnia  -Encourage participation in groups and therapeutic milieu  -Disposition planning will be ongoing  Micheal Likenshristopher T Nyriah Coote, MD 06/12/2018, 2:53 PM

## 2018-06-12 NOTE — [facility] Group Notes (Signed)
LCSW Group Therapy Note  06/12/2018 1:15pm  Type of Therapy/Topic:  Group Therapy:  Balance in Life  Participation Level:  Did Not Attend  Description of Group:    This group will address the concept of balance and how it feels and looks when one is unbalanced. Patients will be encouraged to process areas in their lives that are out of balance and identify reasons for remaining unbalanced. Facilitators will guide patients in utilizing problem-solving interventions to address and correct the stressor making their life unbalanced. Understanding and applying boundaries will be explored and addressed for obtaining and maintaining a balanced life. Patients will be encouraged to explore ways to assertively make their unbalanced needs known to significant others in their lives, using other group members and facilitator for support and feedback.  Therapeutic Goals: 1. Patient will identify two or more emotions or situations they have that consume much of in their lives. 2. Patient will identify signs/triggers that life has become out of balance:  3. Patient will identify two ways to set boundaries in order to achieve balance in their lives:  4. Patient will demonstrate ability to communicate their needs through discussion and/or role plays  Summary of Patient Progress:      Therapeutic Modalities:   Cognitive Behavioral Therapy Solution-Focused Therapy Assertiveness Training  Ida RogueRodney B Quitman Norberto, LCSW 06/12/2018 2:26 PM

## 2018-06-12 NOTE — Progress Notes (Signed)
Patient states that he has been feeling paranoid, and has been isolative to room. .  Patient has been compliant with all medications, patient has not attended groups or engaged in unit activities.  Patient reported feeling increasingly depressed but stated he would be able to maintain safety in the hospital.   Assess patient for safety, offer medications as prescribed, engage patient in 1:1 staff talks.   Patient able to contract for safety.  Continue to monitor as planned.

## 2018-06-12 NOTE — Progress Notes (Signed)
Psychoeducational Group Note  Date:  06/12/2018 Time:  2047  Group Topic/Focus:  Wrap-Up Group:   The focus of this group is to help patients review their daily goal of treatment and discuss progress on daily workbooks.  Participation Level: Did Not Attend  Participation Quality:  Not Applicable  Affect:  Not Applicable  Cognitive:  Not Applicable  Insight:  Not Applicable  Engagement in Group: Not Applicable  Additional Comments:  The patient did not attend the evening group since he was asleep in his bedroom.   Hazle CocaGOODMAN, Courtni Balash S 06/12/2018, 8:47 PM

## 2018-06-13 MED ORDER — HYDROCHLOROTHIAZIDE 12.5 MG PO CAPS: 12.5000 mg | ORAL_CAPSULE | Freq: Every day | ORAL | 0 refills | Status: DC

## 2018-06-13 MED ORDER — TRAZODONE HCL 100 MG PO TABS: 100.0000 mg | ORAL_TABLET | Freq: Every evening | ORAL | 0 refills | Status: DC | PRN

## 2018-06-13 MED ORDER — AMLODIPINE BESYLATE 10 MG PO TABS: 10.0000 mg | ORAL_TABLET | Freq: Every day | ORAL | 0 refills | Status: AC

## 2018-06-13 MED ORDER — HYDRALAZINE HCL 100 MG PO TABS: 100.0000 mg | ORAL_TABLET | Freq: Every day | ORAL | 0 refills | Status: DC

## 2018-06-13 MED ORDER — HYDROXYZINE HCL 50 MG PO TABS: 50.0000 mg | ORAL_TABLET | Freq: Three times a day (TID) | ORAL | 0 refills | Status: DC | PRN

## 2018-06-13 MED ORDER — OXCARBAZEPINE 300 MG PO TABS: 300.0000 mg | ORAL_TABLET | Freq: Two times a day (BID) | ORAL | 0 refills | Status: DC

## 2018-06-13 MED ORDER — OLANZAPINE 15 MG PO TABS: 15.0000 mg | ORAL_TABLET | Freq: Every day | ORAL | 0 refills | Status: DC

## 2018-06-13 MED ORDER — HYDRALAZINE HCL 50 MG PO TABS: 100.0000 mg | ORAL_TABLET | Freq: Every day | ORAL | Status: DC

## 2018-06-13 NOTE — Progress Notes (Signed)
  [facility] :  Will you be returning to the same living situation after discharge:  No.HP shelter At discharge, do you have transportation home?: Yes,  TCT Do you have the ability to pay for your medications: Yes,  MCD  Release of information consent forms completed and in the chart;  Patient's signature needed at discharge.  Patient to Follow up at: Follow-up Information    [facility].   Why:  Monday at 1:40 PM for your hospital follow up appointment Contact information: [address] [place] [address] [phone]           Next level of care provider [facility] Link:no  Safety Planning and Suicide Prevention discussed: Yes,  yes  Have you used any form of tobacco in the last 30 days? (Cigarettes, Smokeless Tobacco, Cigars, and/or Pipes): No  Has patient been referred to the Quitline?: N/A patient is not a smoker  Patient has been referred for addiction treatment: N/A  Ida RogueRodney B Nelline Lio, LCSW 06/13/2018, 9:09 AM

## 2018-06-13 NOTE — Progress Notes (Signed)
Patient ID: Allen RenMichael Morejon V., male   DOB: [birth year], 37 y.o.   MRN: [id number] Patient discharged to shelter/self care with TCT case worker.  Patient denies SI, HI and AVH.  Patient acknowledged understanding of all discharge instructions and receipt of all personal belongings.

## 2018-06-13 NOTE — Plan of Care (Signed)
Pt either did not attend or gave minimal participation in recreational therapy group sessions.   Caroll RancherMarjette Toya Palacios, LRT/CTRS

## 2018-06-13 NOTE — Progress Notes (Signed)
Recreation Therapy Notes  INPATIENT RECREATION TR PLAN  Patient Details Name: Marlee Trentman MRN: [id number] DOB: [birth year] Today's Date: 06/13/2018  Rec Therapy Plan Is patient appropriate for Therapeutic Recreation?: Yes Treatment times per week: about 3 days  Estimated Length of Stay: 5-7 days TR Treatment/Interventions: Group participation (Comment)  Discharge Criteria Pt will be discharged from therapy if:: Discharged Treatment plan/goals/alternatives discussed and agreed upon by:: Patient/family  Discharge Summary Short term goals set: See patient care plan Short term goals met: Not met Progress toward goals comments: Groups attended Which groups?: Self-esteem, Communication Reason goals not met: Pt gave little participation or did not attend groups. Therapeutic equipment acquired: N/A Reason patient discharged from therapy: Discharge from hospital Pt/family agrees with progress & goals achieved: Yes Date patient discharged from therapy: 06/13/18    Victorino Sparrow, LRT/CTRS  Ria Comment, Kaina Orengo A 06/13/2018, 9:45 AM

## 2018-06-13 NOTE — Progress Notes (Signed)
Recreation Therapy Notes  Date: 6.20.19 Time: 1000 Location: 500 Hall Dayroom  Group Topic: Stress Management  Goal Area(s) Addresses:  Patient will verbalize importance of using healthy stress management.  Patient will identify positive emotions associated with healthy stress management.   Intervention: Stress Management  Activity :  LRT introduced the stress management technique of meditation to patients.  LRT spoke with patient about how meditation in conjunction with deep breathing can help to relieve stress and bring on a sense of calm.  LRT then played a meditation that allowed patients to focus on positive thoughts and let their breathing guide them.  Education:  Stress Management, Discharge Planning.   Education Outcome: Acknowledges edcuation/In group clarification offered/Needs additional education  Clinical Observations/Feedback: Pt did not attend group.    Caroll RancherMarjette Rheanne Cortopassi, LRT/CTRS         Caroll RancherLindsay, Djuna Frechette A 06/13/2018 10:54 AM

## 2018-06-13 NOTE — Progress Notes (Signed)
Pt in bed at shift change.  Pt is awake, sts he feels depressed and has passive SI.  Pt agrees to contract for safety and is med compliant.  Pt chief complaint is that he cannot sleep.  Pt denies pain or discomfort.  Pt denies AVH and HI. Pt does not attend group. Pt remains safe on unit with 15 min checks.

## 2018-06-13 NOTE — [facility] Suicide Risk Assessment (Addendum)
[facility]   Principal Problem: Schizoaffective disorder, bipolar type [facility](HCC) Discharge Diagnoses:  Patient Active Problem List   Diagnosis Date Noted  . Auditory hallucinations [R44.0]   . Depression [F32.9]   . Suicidal ideation [R45.851]   . Hypertension [I10] 01/09/2017  . Obesity [E66.9] 01/09/2017  . Schizoaffective disorder, bipolar type (HCC) [F25.0] 01/07/2017  . 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 Abdominal pain [R10.9] 11/20/2014  . Abdominal pain in male [R10.9] 11/18/2014    Total Time spent with patient: 30 minutes  Musculoskeletal: Strength & Muscle Tone: within normal limits Gait & Station: normal Patient leans: N/A  Psychiatric Specialty Exam: Review of Systems  Constitutional: Negative for chills and fever.  Respiratory: Negative for cough and shortness of breath.   Cardiovascular: Negative for chest pain.  Gastrointestinal: Negative for abdominal pain, heartburn, nausea and vomiting.  Psychiatric/Behavioral: Negative for depression, hallucinations and suicidal ideas. The patient is not nervous/anxious and does not have insomnia.     Blood pressure (!) 160/99, pulse 76, temperature 99 F (37.2 C), temperature source Oral, resp. rate 16, height 6\' 2"  (1.88 m), weight (!) 198.7 kg (438 lb), SpO2 97 %.Body mass index is 56.24 kg/m.  General Appearance: Casual and Disheveled  Eye Contact::  Good  Speech:  Clear and Coherent and Normal Rate  Volume:  Normal  Mood:  Anxious and Depressed  Affect:  Appropriate and Congruent  Thought Process:  Coherent and Goal Directed  Orientation:  Full (Time, Place, and Person)  Thought Content:  Hallucinations: Visual  Suicidal Thoughts:  No  Homicidal Thoughts:  No  Memory:  Immediate;   Good Recent;   Good Remote;   Good  Judgement:  Fair  Insight:  Fair  Psychomotor Activity:  Normal  Concentration:  Fair  Recall:  Good  Fund of  Knowledge:Fair  Language: Fair  Akathisia:  No  Handed:    AIMS (if indicated):     Assets:  Communication Skills Resilience Social Support  Sleep:  Number of Hours: 5.25  Cognition: WNL  ADL's:  Intact   Mental Status Per Nursing Assessment::   On Admission:  Suicidal ideation indicated by patient, Self-harm thoughts  Demographic Factors:  Male, Low socioeconomic status, Living alone and Unemployed  Loss Factors: Financial problems/change in socioeconomic status  Historical Factors: Impulsivity  Risk Reduction Factors:   Positive social support, Positive therapeutic relationship and Positive coping skills or problem solving skills  Continued Clinical Symptoms:  Severe Anxiety and/or Agitation Bipolar Disorder:   Depressive phase Schizophrenia:   Paranoid or undifferentiated type Chronic Pain Previous Psychiatric Diagnoses and Treatments Medical Diagnoses and Treatments/Surgeries  Cognitive Features That Contribute To Risk:  None    Suicide Risk:  Minimal: No identifiable suicidal ideation.  Patients presenting with no risk factors but with morbid ruminations; may be classified as minimal risk based on the severity of the depressive symptoms  Follow-up Information    [facility].   Why:  Monday at 1:40 PM for your hospital follow up appointment Contact information: [address] [place] [address] [phone]          Subjective Data: Allen Sosa is a 37 y/o M with history of schizoaffective disorder bipolar type who [facility] with worsening symptoms of depression, SI with plan to lie in the road, and worsening AH. Pt was medically cleared and [facility]  [facility] and [facility].Pt had been receiving [place] injections as an outpatient, but he did not feel that they were helpful for his symptoms, so he was transitioned to regimen of trileptal and  abilify.Pt reported ongoing symptoms of AH, paranoia, and anxiety, and he shared previous efficacy for 5 years while on haldol, so he was changed from abilify to haldol; however, he then later requested to be changed to zyprexa, and that dose has been titrated up during his stay. Pt continued to report paranoia, AH, and VH after titration dose of zyprexa to 20mg /day, and so he was started on additional antipsychotic of haldol to address ongoing symptoms.He had improvement of his presenting symptoms.  Today upon evaluation, pt shares, "I'm feeling a little paranoid today." Pt reports that overall he is feeling better compared to when he first was admitted. He is sleeping well. His appetite is good. He denies other physical complaints. He denies SI/HI/AH. He reports some mild VH today, describing them as "Just a little." He feels that his medications have been helpful, and he is in agreement to continue his current regimen without changes. He plans to stay in a shelter in the [facility] area and he agrees to have follow up at [facility] and he will also work with the Transition Care Team. He was able to engage in safety planning including plan to return to [facility] or contact emergency services if he feels unable to maintain his own safety or the safety of others. Pt had no further questions, comments, or concerns.   Plan Of Care/Follow-up recommendations:   -Discharge to outpatient level of care  -Schizoaffective disorder, bipolar type  -Continuezyprexa 20mg  po qhs -Discontinue haldol 5mg  po BID -Continue trileptal 300mg  po BID  - HTN  -Continue norvasc 10mg  po qDay -Continue hydralazine 100mg  po qDay -Continue HCTZ 12.5mg  po qDay  -EPS -Continuecogentin 1mg  po/IM q12h prn EPS  -Anxiety -Continue vistaril 50mg  po q8h prnmildanxiety  -Insomnia -Continue trazodone  100mg  po qhs prn insomnia  Activity:  as tolerated Diet:  normal Tests:  NA Other:  see above for DC plan  Micheal Likens, MD 06/13/2018, 9:59 AM

## 2018-06-13 NOTE — Progress Notes (Signed)
Pt bp is 202/120.  Prn bp medication given.  Recheck bp in 30 min.  Pt in room in bed

## 2018-06-13 NOTE — Discharge Summary (Addendum)
Physician Discharge Summary Note  Patient:  Allen [name] is an 37 y.o., male  MRN:  [id number]  DOB:  [birth year]  Patient phone:  [phone] (home)   [place]   [address] [place] [id number],   Total Time spent with patient: Greater than 30 minutes  Date of Admission:  06/03/2018  Date of Discharge: 06/13/2018  Reason for Admission: Worsening symptoms of depression, SI with plan to lie in the road, and worsening AH.    Principal Problem: Schizoaffective disorder, bipolar type [facility](HCC)  Discharge Diagnoses: Patient Active Problem List   Diagnosis Date Noted  . Auditory hallucinations [R44.0]   . Depression [F32.9]   . Suicidal ideation [R45.851]   . Hypertension [I10] 01/09/2017  . Obesity [E66.9] 01/09/2017  . Schizoaffective disorder, bipolar type (HCC) [F25.0] 01/07/2017  . 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 Abdominal pain [R10.9] 11/20/2014  . Abdominal pain in male [R10.9] 11/18/2014   Past Psychiatric History: Schizoaffective disorder, Bipolar type.  Past Medical History:  Past Medical History:  Diagnosis Date  . Anxiety   . Depression   . Hyperlipemia   . Hypertension   . Obesity   . Schizo affective schizophrenia (HCC)   . Sleep apnea     Past Surgical History:  Procedure Laterality Date  . VASECTOMY     Family History:  Family History  Problem Relation Age of Onset  . Heart attack Mother   . Cancer Mother   . Cancer Sister    Family Psychiatric  History: See H&P.  Social History:  Social History   Substance and Sexual Activity  Alcohol Use No  . Alcohol/week: 0.0 oz     Social History   Substance and Sexual Activity  Drug Use No    Social History   Socioeconomic History  . Marital status: Married    Spouse name: Not on file  . Number of children: 1  . Years of education: 9311  . Highest education level: Not on file  Occupational History  .  Occupation: [employer]  Social Needs  . Financial resource strain: Not on file  . Food insecurity:    Worry: Not on file    Inability: Not on file  . Transportation needs:    Medical: Not on file    Non-medical: Not on file  Tobacco Use  . Smoking status: Never Smoker  . Smokeless tobacco: Never Used  Substance and Sexual Activity  . Alcohol use: No    Alcohol/week: 0.0 oz  . Drug use: No  . Sexual activity: Yes    Birth control/protection: None  Lifestyle  . Physical activity:    Days per week: Not on file    Minutes per session: Not on file  . Stress: Not on file  Relationships  . Social connections:    Talks on phone: Not on file    Gets together: Not on file    Attends religious service: Not on file    Active member of club or organization: Not on file    Attends meetings of clubs or organizations: Not on file    Relationship status: Not on file  Other Topics Concern  . Not on file  Social History Narrative   Drinks caffeine daily    Hospital Course: (Per Md's discharge SRA): Allen Sosa is a 37 y/o M with history of schizoaffective disorder bipolar type who was admitted voluntarily from [facility] with worsening symptoms of  depression, SI with plan to lie in the road, and worsening AH. Pt was medically cleared and then transferred to [facility] for additional treatment and stabilization.Pt had been receiving [place] injections as an outpatient, but he did not feel that they were helpful for his symptoms, so he was transitioned to regimen of trileptal and abilify.Pt reported ongoing symptoms of AH, paranoia, and anxiety, and he shared previous efficacy for 5 years while on haldol, so he was changed from abilify to haldol; however, he then later requested to be changed to zyprexa, and that dose has been titrated up during his stay.Pt continued to report paranoia, AH, and VH after titration dose of zyprexa to 15 mg/day, and so he was started on additional antipsychotic of haldol to  address ongoing symptoms. He had improvement of his presenting symptoms & haldol was discontinued.  Besides the use of Zyprexa 15 mg for mood control, Allen Sosa was also medicated & discharged on; Trileptal  300 mg for mood control & Trazodone 100 mg prn for insomnia. He was  enrolled & participated in the group counseling sessions being offered & held on this unit. He learned coping skills that should help him cope better to maintain mood stability. He presented other significant pre-existing issues that required treatment & or monitoring. He was resumed/discharged on all his pertinent home medications for those health issues. He tolerated his treatment regimen without any adverse effects or reactions reported.  Today upon evaluation, pt shares, "I'm feeling a little paranoid today." Pt reports that overall he is feeling better compared to when he first was admitted. He is sleeping well. His appetite is good. He denies other physical complaints. He denies SI/HI/AH. He reports some mild VH today, describing them as "Just a little." He feels that his medications have been helpful, and he is in agreement to continue his current regimen without changes. He plans to stay in a shelter in the [facility] area and he agrees to have follow up at [facility] and he will also work with the Transition Care Team. He was able to engage in safety planning including plan to return to [facility] or contact emergency services if he feels unable to maintain his own safety or the safety of others. Pt had no further questions, comments, or concerns.  Physical Findings: AIMS: Facial and Oral Movements Muscles of Facial Expression: None, normal Lips and Perioral Area: None, normal Jaw: None, normal Tongue: None, normal,Extremity Movements Upper (arms, wrists, hands, fingers): None, normal Lower (legs, knees, ankles, toes): None, normal, Trunk Movements Neck, shoulders, hips: None, normal, Overall Severity Severity  of abnormal movements (highest score from questions above): None, normal Incapacitation due to abnormal movements: None, normal Patient's awareness of abnormal movements (rate only patient's report): No Awareness, Dental Status Current problems with teeth and/or dentures?: No Does patient usually wear dentures?: No  CIWA:    COWS:     Musculoskeletal: Strength & Muscle Tone: within normal limits Gait & Station: normal Patient leans: N/A  Psychiatric Specialty Exam:  SEE MD SRA Physical Exam  Nursing note and vitals reviewed. Constitutional: He appears well-developed.  HENT:  Head: Normocephalic.  Eyes: Pupils are equal, round, and reactive to light.  Neck: Normal range of motion.  Cardiovascular: Normal rate.  Respiratory: Effort normal.  GI: Soft.  Genitourinary:  Genitourinary Comments: Deferred  Musculoskeletal: Normal range of motion.  Neurological: He is alert.  Skin: Skin is warm.    Review of Systems  Constitutional: Negative.  HENT: Negative.   Eyes: Negative.   Respiratory: Negative.   Cardiovascular: Negative.   Gastrointestinal: Negative.   Genitourinary: Negative.   Musculoskeletal: Negative.   Skin: Negative.   Neurological: Negative.   Endo/Heme/Allergies: Negative.   Psychiatric/Behavioral: Positive for depression (Stabilized with medication prior to discharge) and hallucinations (Hx. psychosis (stabilized with medication prior to discharge)). Negative for memory loss, substance abuse and suicidal ideas. The patient has insomnia (Stabilized with medication prior to discharge). The patient is not nervous/anxious.     Blood pressure (!) 160/99, pulse 76, temperature 99 F (37.2 C), temperature source Oral, resp. rate 16, height 6\' 2"  (1.88 m), weight (!) 198.7 kg (438 lb), SpO2 97 %.Body mass index is 56.24 kg/m.   Have you used any form of tobacco in the last 30 days? (Cigarettes, Smokeless Tobacco, Cigars, and/or Pipes): No  Has this patient used any  form of tobacco in the last 30 days? (Cigarettes, Smokeless Tobacco, Cigars, and/or Pipes): N/A  Blood Alcohol level:  Lab Results  Component Value Date   ETH <10 06/02/2018   ETH <10 05/31/2018   Metabolic Disorder Labs:  Lab Results  Component Value Date   HGBA1C 5.6 01/09/2017   MPG 114 01/09/2017   Lab Results  Component Value Date   PROLACTIN 33.8 (H) 01/09/2017   Lab Results  Component Value Date   CHOL 198 01/09/2017   TRIG 128 01/09/2017   HDL 40 (L) 01/09/2017   CHOLHDL 5.0 01/09/2017   VLDL 26 01/09/2017   LDLCALC 132 (H) 01/09/2017   See Psychiatric Specialty Exam and Suicide Risk Assessment completed by Attending Physician prior to discharge.  Discharge destination:  Home  Is patient on multiple antipsychotic therapies at discharge:  No   Has Patient had three or more failed trials of antipsychotic monotherapy by history:  No  Recommended Plan for Multiple Antipsychotic Therapies: NA  Allergies as of 06/13/2018   No Known Allergies     Medication List    STOP taking these medications   clonazePAM 2 MG tablet Commonly known as:  KLONOPIN   ibuprofen 200 MG tablet Commonly known as:  ADVIL,MOTRIN   multivitamin with minerals Tabs tablet     TAKE these medications     Indication  amLODipine 10 MG tablet Commonly known as:  NORVASC Take 1 tablet (10 mg total) by mouth daily. For high blood pressure What changed:  additional instructions  Indication:  High Blood Pressure Disorder   hydrALAZINE 100 MG tablet Commonly known as:  APRESOLINE Take 1 tablet (100 mg total) by mouth daily. For high blood pressure What changed:  additional instructions  Indication:  High Blood Pressure Disorder   hydrochlorothiazide 12.5 MG capsule Commonly known as:  MICROZIDE Take 1 capsule (12.5 mg total) by mouth daily. For high blood pressure What changed:  additional instructions  Indication:  High Blood Pressure Disorder   hydrOXYzine 50 MG tablet Commonly  known as:  ATARAX/VISTARIL Take 1 tablet (50 mg total) by mouth 3 (three) times daily as needed for anxiety.  Indication:  Feeling Anxious   OLANZapine 15 MG tablet Commonly known as:  ZYPREXA Take 1 tablet (15 mg total) by mouth at bedtime. For mood control What changed:    medication strength  how much to take  additional instructions  Indication:  Mood control   Oxcarbazepine 300 MG tablet Commonly known as:  TRILEPTAL Take 1 tablet (300 mg total) by mouth 2 (two) times daily. For mood stabilization  Indication:  Mood  stabilization   traZODone 100 MG tablet Commonly known as:  DESYREL Take 1 tablet (100 mg total) by mouth at bedtime as needed for sleep.  Indication:  Trouble Sleeping      Follow-up Information    [facility] [place] Follow up on 06/17/2018.   Why:  Monday at 1:40 PM for your hospital follow up appointment Contact information: [address] [place] [address] [phone]          Follow-up recommendations: Activity:  As tolerated Diet: As recommended by your primary care doctor. Keep all scheduled follow-up appointments as recommended.   Comments: Patient is instructed prior to discharge to: Take all medications as prescribed by his/her mental healthcare provider. Report any adverse effects and or reactions from the medicines to his/her outpatient provider promptly. Patient has been instructed & cautioned: To not engage in alcohol and or illegal drug use while on prescription medicines. In the event of worsening symptoms, patient is instructed to call the crisis hotline, 911 and or go to the nearest ED for appropriate evaluation and treatment of symptoms. To follow-up with his/her primary care provider for your other medical issues, concerns and or health care needs.     Signed: Armandina Stammer, NP pmhnp, fnp-BC 06/13/2018, 3:35 PM   Patient seen, Suicide Assessment Completed.  Disposition Plan Reviewed

## 2018-06-19 ENCOUNTER — Other Ambulatory Visit: Payer: Self-pay

## 2018-06-19 ENCOUNTER — Emergency Department (HOSPITAL_COMMUNITY): Payer: Medicaid Other

## 2018-06-19 ENCOUNTER — Encounter (HOSPITAL_COMMUNITY): Payer: Self-pay

## 2018-06-19 ENCOUNTER — Emergency Department (HOSPITAL_COMMUNITY)
Admission: EM | Admit: 2018-06-19 | Discharge: 2018-06-20 | Disposition: A | Discharge status: Home / Self Care | Payer: Medicaid Other | Attending: Emergency Medicine | Admitting: Emergency Medicine

## 2018-06-19 DIAGNOSIS — F419 Anxiety disorder, unspecified: Secondary | ICD-10-CM | POA: Diagnosis not present

## 2018-06-19 DIAGNOSIS — R45851 Suicidal ideations: Secondary | ICD-10-CM | POA: Insufficient documentation

## 2018-06-19 DIAGNOSIS — Z79899 Other long term (current) drug therapy: Secondary | ICD-10-CM | POA: Insufficient documentation

## 2018-06-19 DIAGNOSIS — I10 Essential (primary) hypertension: Secondary | ICD-10-CM | POA: Insufficient documentation

## 2018-06-19 DIAGNOSIS — R062 Wheezing: Secondary | ICD-10-CM | POA: Insufficient documentation

## 2018-06-19 DIAGNOSIS — F32A Depression, unspecified: Secondary | ICD-10-CM

## 2018-06-19 DIAGNOSIS — F250 Schizoaffective disorder, bipolar type: Secondary | ICD-10-CM | POA: Insufficient documentation

## 2018-06-19 DIAGNOSIS — F329 Major depressive disorder, single episode, unspecified: Secondary | ICD-10-CM

## 2018-06-19 DIAGNOSIS — F332 Major depressive disorder, recurrent severe without psychotic features: Secondary | ICD-10-CM | POA: Diagnosis not present

## 2018-06-19 DIAGNOSIS — F333 Major depressive disorder, recurrent, severe with psychotic symptoms: Secondary | ICD-10-CM | POA: Diagnosis not present

## 2018-06-19 DIAGNOSIS — F99 Mental disorder, not otherwise specified: Secondary | ICD-10-CM | POA: Diagnosis present

## 2018-06-19 LAB — CBC
HCT: 39 % (ref 39.0–52.0)
Hemoglobin: 13.7 g/dL (ref 13.0–17.0)
MCH: 29.3 pg (ref 26.0–34.0)
MCHC: 35.1 g/dL (ref 30.0–36.0)
MCV: 83.5 fL (ref 78.0–100.0)
PLATELETS: 271 10*3/uL (ref 150–400)
RBC: 4.67 MIL/uL (ref 4.22–5.81)
RDW: 13.2 % (ref 11.5–15.5)
WBC: 6.4 10*3/uL (ref 4.0–10.5)

## 2018-06-19 LAB — ETHANOL

## 2018-06-19 LAB — COMPREHENSIVE METABOLIC PANEL
ALT: 27 U/L (ref 0–44)
AST: 26 U/L (ref 15–41)
Albumin: 3.6 g/dL (ref 3.5–5.0)
Alkaline Phosphatase: 84 U/L (ref 38–126)
Anion gap: 8 (ref 5–15)
BILIRUBIN TOTAL: 0.2 mg/dL — AB (ref 0.3–1.2)
BUN: 12 mg/dL (ref 6–20)
CHLORIDE: 105 mmol/L (ref 98–111)
CO2: 25 mmol/L (ref 22–32)
CREATININE: 1.49 mg/dL — AB (ref 0.61–1.24)
Calcium: 9.4 mg/dL (ref 8.9–10.3)
GFR, EST NON AFRICAN AMERICAN: 59 mL/min — AB (ref >60)
Glucose, Bld: 92 mg/dL (ref 70–99)
POTASSIUM: 4.2 mmol/L (ref 3.5–5.1)
Sodium: 138 mmol/L (ref 135–145)
TOTAL PROTEIN: 7.7 g/dL (ref 6.5–8.1)

## 2018-06-19 LAB — RAPID URINE DRUG SCREEN, HOSP PERFORMED
AMPHETAMINES: NOT DETECTED
BENZODIAZEPINES: NOT DETECTED
Cocaine: NOT DETECTED
OPIATES: NOT DETECTED
TETRAHYDROCANNABINOL: NOT DETECTED

## 2018-06-19 LAB — SALICYLATE LEVEL

## 2018-06-19 LAB — ACETAMINOPHEN LEVEL: Acetaminophen (Tylenol), Serum: <10 ug/mL — ABNORMAL LOW (ref 10–30)

## 2018-06-19 LAB — TROPONIN I: Troponin I: <0.03 ng/mL (ref <0.03)

## 2018-06-19 MED ORDER — ALBUTEROL SULFATE HFA 108 (90 BASE) MCG/ACT IN AERS: 2.0000 | INHALATION_SPRAY | Freq: Once | RESPIRATORY_TRACT | Status: DC

## 2018-06-19 MED ORDER — ALBUTEROL SULFATE (2.5 MG/3ML) 0.083% IN NEBU
5.0000 mg | INHALATION_SOLUTION | Freq: Once | RESPIRATORY_TRACT | Status: AC
  Administered 2018-06-19: 5 mg via RESPIRATORY_TRACT
  Filled 2018-06-19: qty 6

## 2018-06-19 NOTE — ED Triage Notes (Signed)
Patient was waiting in the hall in Triage and stopped Clinical research associatewriter and stated that he was depressed and suicidal. Patient states his plan is to" run out onto the highway and let the cars come and thump over me." Patient denies any auditory or visual hallucinations. Patient denies any HI. Patient denies any drug or alcohol use.

## 2018-06-19 NOTE — ED Notes (Signed)
Bed: WLPT3 Expected date:  Expected time:  Means of arrival:  Comments: 

## 2018-06-19 NOTE — ED Provider Notes (Signed)
[facility]-EMERGENCY DEPT Provider Note   CSN: [id number] Arrival date & time: 06/19/18  1813     History   Chief Complaint Chief Complaint  Patient presents with  . Shortness of Breath  . Chest Pain  . Suicidal    HPI Allen Sosa is a 37 y.o. male.  The history is provided by the patient and medical records.  Mental Health Problem  Presenting symptoms: depression, hallucinations, paranoid behavior, suicidal thoughts and suicidal threats   Presenting symptoms: no suicide attempt   Degree of incapacity (severity):  Severe Onset quality:  Gradual Duration:  2 days Timing:  Constant Progression:  Worsening Chronicity:  Recurrent Relieved by:  Nothing Worsened by:  Nothing Ineffective treatments:  None tried Associated symptoms: no abdominal pain, no chest pain, no fatigue and no headaches   Wheezing   This is a chronic problem. The current episode started more than 2 days ago. The problem occurs daily. The problem has not changed since onset.Pertinent negatives include no chest pain, no fever, no abdominal pain, no vomiting, no diarrhea, no dysuria, no headaches, no rhinorrhea, no sore throat, no neck pain, no cough and no sputum production. Precipitated by: weather and heat. He has tried nothing for the symptoms. His past medical history is significant for asthma.    Past Medical History:  Diagnosis Date  . Anxiety   . Depression   . Hyperlipemia   . Hypertension   . Obesity   . Schizo affective schizophrenia (HCC)   . Sleep apnea     Patient Active Problem List   Diagnosis Date Noted  . Auditory hallucinations   . Depression   . Suicidal ideation   . Hypertension 01/09/2017  . Obesity 01/09/2017  . Schizoaffective disorder, bipolar type (HCC) 01/07/2017  . 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 Abdominal pain 11/20/2014  . Abdominal pain in male 11/18/2014    Past Surgical History:    Procedure Laterality Date  . VASECTOMY          Home Medications    Prior to Admission medications   Medication Sig Start Date End Date Taking? Authorizing Provider  amLODipine (NORVASC) 10 MG tablet Take 1 tablet (10 mg total) by mouth daily. For high blood pressure 06/13/18   Armandina Stammer I, NP  hydrALAZINE (APRESOLINE) 100 MG tablet Take 1 tablet (100 mg total) by mouth daily. For high blood pressure 06/13/18   Nwoko, Nicole Kindred I, NP  hydrochlorothiazide (MICROZIDE) 12.5 MG capsule Take 1 capsule (12.5 mg total) by mouth daily. For high blood pressure 06/13/18   Armandina Stammer I, NP  hydrOXYzine (ATARAX/VISTARIL) 50 MG tablet Take 1 tablet (50 mg total) by mouth 3 (three) times daily as needed for anxiety. 06/13/18   Armandina Stammer I, NP  OLANZapine (ZYPREXA) 15 MG tablet Take 1 tablet (15 mg total) by mouth at bedtime. For mood control 06/13/18   Armandina Stammer I, NP  Oxcarbazepine (TRILEPTAL) 300 MG tablet Take 1 tablet (300 mg total) by mouth 2 (two) times daily. For mood stabilization 06/13/18   Armandina Stammer I, NP  traZODone (DESYREL) 100 MG tablet Take 1 tablet (100 mg total) by mouth at bedtime as needed for sleep. 06/13/18   Sanjuana Kava, NP    Family History Family History  Problem Relation Age of Onset  . Heart attack Mother   . Cancer Mother   . Cancer Sister     Social History Social History  Tobacco Use  . Smoking status: Never Smoker  . Smokeless tobacco: Never Used  Substance Use Topics  . Alcohol use: No    Alcohol/week: 0.0 oz  . Drug use: No     Allergies   Patient has no known allergies.   Review of Systems Review of Systems  Constitutional: Negative for chills, diaphoresis, fatigue and fever.  HENT: Negative for congestion, rhinorrhea and sore throat.   Respiratory: Positive for chest tightness, shortness of breath and wheezing. Negative for cough, sputum production and stridor.   Cardiovascular: Negative for chest pain.  Gastrointestinal: Negative for  abdominal pain, diarrhea and vomiting.  Genitourinary: Negative for dysuria and flank pain.  Musculoskeletal: Negative for back pain, neck pain and neck stiffness.  Neurological: Negative for light-headedness, numbness and headaches.  Psychiatric/Behavioral: Positive for hallucinations, paranoia and suicidal ideas.  All other systems reviewed and are negative.    Physical Exam Updated Vital Signs BP (!) 179/114 (BP Location: Left Arm)   Pulse 90   Temp 98.9 F (37.2 C) (Oral)   Resp 18   Ht 6\' 3"  (1.905 m)   Wt (!) 198.7 kg (438 lb)   SpO2 97%   BMI 54.75 kg/m   Physical Exam  Constitutional: He appears well-developed and well-nourished.  Non-toxic appearance. He does not appear ill. No distress.  HENT:  Head: Normocephalic and atraumatic.  Mouth/Throat: Oropharynx is clear and moist. No oropharyngeal exudate.  Eyes: Pupils are equal, round, and reactive to light. Conjunctivae are normal.  Neck: Normal range of motion. Neck supple.  Cardiovascular: Normal rate and regular rhythm.  No murmur heard. Pulmonary/Chest: Effort normal. No stridor. No respiratory distress. He has no decreased breath sounds. He has wheezes. He has no rhonchi. He has no rales. He exhibits no tenderness.  Abdominal: Soft. There is no tenderness.  Musculoskeletal: He exhibits no edema or tenderness.  Lymphadenopathy:    He has no cervical adenopathy.  Neurological: He is alert.  Skin: Skin is warm and dry. Capillary refill takes less than 2 seconds. He is not diaphoretic. No erythema. No pallor.  Psychiatric: His affect is not angry. He is not agitated. Thought content is paranoid. He exhibits a depressed mood. He expresses suicidal ideation. He expresses no homicidal ideation. He expresses suicidal plans. He expresses no homicidal plans.  Nursing note and vitals reviewed.    ED Treatments / Results  Labs (all labs ordered are listed, but only abnormal results are displayed) Labs Reviewed    COMPREHENSIVE METABOLIC PANEL - Abnormal; Notable for the following components:      Result Value   Creatinine, Ser 1.49 (*)    Total Bilirubin 0.2 (*)    GFR calc non Af Amer 59 (*)    All other components within normal limits  ACETAMINOPHEN LEVEL - Abnormal; Notable for the following components:   Acetaminophen (Tylenol), Serum <10 (*)    All other components within normal limits  RAPID URINE DRUG SCREEN, HOSP PERFORMED - Abnormal; Notable for the following components:   Barbiturates   (*)    Value: Result not available. Reagent lot number recalled by manufacturer.   All other components within normal limits  ETHANOL  SALICYLATE LEVEL  CBC  TROPONIN I    EKG EKG Interpretation  Date/Time:  Wednesday June 19 2018 18:34:08 EDT Ventricular Rate:  98 PR Interval:    QRS Duration: 81 QT Interval:  337 QTC Calculation: 431 R Axis:   12 Text Interpretation:  Sinus rhythm When comapred to  prior, no significant changes seen.  no STEMI Confirmed by Theda Belfast ([id number]) on 06/19/2018 6:55:53 PM   Radiology Dg Chest 2 View  Result Date: 06/19/2018 CLINICAL DATA:  Chest pain and shortness of breath. EXAM: CHEST - 2 VIEW COMPARISON:  CTA chest and chest x-ray dated May 12, 2018. FINDINGS: The heart size and mediastinal contours are within normal limits. Both lungs are clear. Unchanged mild elevation of the right hemidiaphragm. The visualized skeletal structures are unremarkable. IMPRESSION: No active cardiopulmonary disease. Electronically Signed   By: Obie Dredge M.D.   On: 06/19/2018 19:28    Procedures Procedures (including critical care time)  Medications Ordered in ED Medications  albuterol (PROVENTIL HFA;VENTOLIN HFA) 108 (90 Base) MCG/ACT inhaler 2 puff (2 puffs Inhalation Not Given 06/19/18 2021)  albuterol (PROVENTIL) (2.5 MG/3ML) 0.083% nebulizer solution 5 mg (5 mg Nebulization Given 06/19/18 2018)     Initial Impression / Assessment and Plan / ED Course  I have  reviewed the triage vital signs and the nursing notes.  Pertinent labs & imaging results that were available during my care of the patient were reviewed by me and considered in my medical decision making (see chart for details).     Allen Sosa is a 37 y.o. male past medical history significant for schizoaffective disorder, asthma, sleep apnea, hypertension, hyperlipidemia, depression, and recent admission for suicidal ideation who presents with shortness of breath and suicidal ideation.  Patient reports that he has been getting short of breath when he is outside and ambulating.  He reports that heat makes his wheezing worsen.  He does not have an inhaler and is concerned about his breathing.  He reports his chest is tight at the time but denies any pressure-like chest pain.  He denies fevers, chills, or productive cough.  He denies other complaints physically.  He is more concerned today about his suicidal ideation returning.  He reports that he was admitted and discharged 6 days ago from behavioral health.  He says that he was told to come back if he had worsening suicidal thoughts.  He says that he had either a plan of laying down in traffic and getting run over by a car or overdosing on pills.  He reports that he has access to multiple bottles of medications which she could take to overdose.  He reports that he tried to overdose on pills in the past.  He also reports that he has had some audiovisual hallucinations at times.  He says that he is feeling more paranoid and feels that he cannot tell if "who is real and who is not".  He reports that people are "out to get me".  On exam, patient had mild wheezing in all lung fields.  Chest was nontender.  Back was nontender.  Abdomen nontender.  No murmurs.  Patient alert and oriented.  Patient will be given albuterol and see if this helps his breathing.  EKG appeared similar to prior with no STEMI.  Patient will get a chest x-ray given his recent  admission and his shortness of breath.  He will have screening laboratory testing.    If medical work-up is reassuring, anticipate patient will be stable for TTS reevaluation for his recurrent suicidal ideation with a plan and means to kill himself.  11:16 PM Patient received albuterol and was breathing much better.  Suspect his asthma flared up today and the son and heat worsening his breathing.  Patient's diagnostic laboratory testing was overall reassuring.  Creatinine  similar to prior.  Chest x-ray shows no pneumonia or other cardiopulmonary disease.  Given reassuring work-up, patient is felt to be medically cleared for further psychiatric evaluation.  TTS consult placed.  Anticipate following up on their recommendations.  At this time, patient is voluntary.    Final Clinical Impressions(s) / ED Diagnoses   Final diagnoses:  Wheezing  Suicidal ideation     Clinical Impression: 1. Wheezing   2. Suicidal ideation     Disposition: Awaiting psychiatric recommendations  This note was prepared with assistance of Dragon voice recognition software. Occasional wrong-word or sound-a-like substitutions may have occurred due to the inherent limitations of voice recognition software.      Tegeler, Canary Brimhristopher J, MD 06/20/18 [phone]

## 2018-06-19 NOTE — ED Notes (Signed)
TTS at bedside. 

## 2018-06-19 NOTE — BH Assessment (Addendum)
Assessment Note  Allen Sosa is an 37 y.o. male, who presents voluntary and unaccompanied to [facility].  Pt's presents to [facility] on 06/03/2018 with a similar presentation. Clinician asked the pt, "what brought you to the hospital?" Pt reported, he was told by the staff at [facility] if his symptoms reoccur to come back. Clinician asked the pt to describe his symptoms. Pt reported, paranoia, he can not tell "real from fake," thinking everyone is talking about him, people want to see him dead, he feels like he's dreaming or in a movie. Pt reported, he is suicidal with a plan to lay in traffic/the middle of the street, get ran over by a car. Pt reported, he has been suicidal for two weeks. Pt denies, HI, self-injurious behaviors and access to weapons.   Pt denies abuse and substance use. Pt's reported. being linked to [facility] for medication management and counseling. Pt was [facility] from [facility] on [facility]13[facility].   Pt present alert in a hospital gown with logical/coherent speech. Pt's eye contact was fair. Pt's mood was depressed, helpless, despair. Pt's affect was flat. Pt's thought process was coherent/relevant. Pt's judgement was parital. Pt's was oriented x4. Pt's concentration was normal. Pt's insight and impulse control was fair. Pt reported, if discharged from [facility] could you contract for safety. Pt reported, if inpatient treatment he would sign-in voluntarily.   Diagnosis:  F25.0 Schizoaffective Disorder, Bipolar type  Past Medical History:  Past Medical History:  Diagnosis Date  . Anxiety   . Depression   . Hyperlipemia   . Hypertension   . Obesity   . Schizo affective schizophrenia (HCC)   . Sleep apnea     Past Surgical History:  Procedure Laterality Date  . VASECTOMY      Family History:  Family History  Problem Relation Age of Onset  . Heart attack Mother   . Cancer Mother   . Cancer Sister     Social History:  reports that he has never smoked. He has never used  smokeless tobacco. He reports that he does not drink alcohol or use drugs.  Additional Social History:  Alcohol / Drug Use Pain Medications: See MAR Prescriptions: See MAR Over the Counter: See MAR History of alcohol / drug use?: No history of alcohol / drug abuse(Pt's UDS is negative. )  CIWA: CIWA-Ar BP: (!) 149/90 Pulse Rate: 84 COWS:    Allergies: No Known Allergies  Home Medications:  (Not in a hospital admission)  OB/GYN Status:  No LMP for male patient.  General Assessment Data [facility]: [facility] TTS Assessment: In system Is this a Tele or Face-to-Face Assessment?: Face-to-Face Is this an Initial Assessment or a Re-assessment for this encounter?: Initial Assessment Marital status: Separated Maiden name: NA Is patient pregnant?: No Pregnancy Status: No Living Arrangements: Other (Comment)(Homeless. ) Can pt return to current living arrangement?: Yes Admission Status: Voluntary Is patient capable of signing voluntary admission?: Yes Referral Source: Self/Family/Friend Insurance type: Medicaid.      Crisis Care Plan Living Arrangements: Other (Comment)(Homeless. ) Legal Guardian: Other:(Self. ) [facility]iatrist[facility]  Name of Therapist: [facility].   Education Status Is patient currently in school?: No Is the patient employed, unemployed or receiving disability?: Receiving disability income  Risk to self with the past 6 months Suicidal Ideation: Yes-Currently Present Has patient been a risk to self within the past 6 months prior to admission? : Yes Suicidal Intent: Yes-Currently Present Has patient  had any suicidal intent within the past 6 months prior to admission? : Yes Is patient at risk for suicide?: Yes Suicidal Plan?: Yes-Currently Present Has patient had any suicidal plan within the past 6 months prior to admission? : Yes Specify Current Suicidal Plan: Pt reported, laying in traffic in he middle of the street.   Access to Means: Yes Specify Access to Suicidal Means: Pt is able to lay in traffic.  What has been your use of drugs/alcohol within the last 12 months?: Pt's UDS is negative.  Previous Attempts/Gestures: Yes How many times?: 1(Per chart. ) Other Self Harm Risks: Pt denies.  Triggers for Past Attempts: Unknown Intentional Self Injurious Behavior: None(Pt denies. ) Comment - Self Injurious Behavior: Pt denies.  Family Suicide History: No Recent stressful life event(s): Other (Comment)(Homelessness, being separated from his wife. ) Persecutory voices/beliefs?: Yes Depression: Yes Depression Symptoms: Feeling worthless/self pity(sadness) Substance abuse history and/or treatment for substance abuse?: No Suicide prevention information given to non-admitted patients: Not applicable  Risk to Others within the past 6 months Homicidal Ideation: No(Pt denies. ) Does patient have any lifetime risk of violence toward others beyond the six months prior to admission? : No(Pt denies. ) Thoughts of Harm to Others: No(Pt denies. ) Comment - Thoughts of Harm to Others: Pt denies.  Current Homicidal Intent: No(Pt denies. ) Current Homicidal Plan: No(Pt denies. ) Describe Current Homicidal Plan: Pt denies.  Access to Homicidal Means: No Describe Access to Homicidal Means: Pt denies.  Identified Victim: NA History of harm to others?: No(Pt denies. ) Assessment of Violence: None Noted Violent Behavior Description: NA Does patient have access to weapons?: No(Pt denies. ) Criminal Charges Pending?: No Does patient have a court date: No Is patient on probation?: No  Psychosis Hallucinations: Auditory, Visual Delusions: Unspecified  Mental Status Report Appearance/Hygiene: In hospital gown Eye Contact: Fair Motor Activity: Unremarkable Speech: Logical/coherent Level of Consciousness: Alert Mood: Depressed, Helpless, Despair Affect: Flat Anxiety Level: Minimal Thought Processes: Coherent,  Relevant Judgement: Partial Orientation: Person, Place, Time, Situation Obsessive Compulsive Thoughts/Behaviors: None  Cognitive Functioning Concentration: Normal Memory: Recent Intact Is patient IDD: No Is patient DD?: No Insight: Fair Impulse Control: Fair Appetite: Poor Sleep: Decreased Total Hours of Sleep: (Pt reported, 1-2 hours. ) Vegetative Symptoms: Staying in bed  ADLScreening [facility] Assessment Services) Patient's cognitive ability adequate to safely complete daily activities?: Yes Patient able to express need for assistance with ADLs?: Yes Independently performs ADLs?: Yes (appropriate for developmental age)  Prior Inpatient Therapy Prior Inpatient Therapy: Yes Prior Therapy Dates: 3-4 years ago. From 06-03-2018-60-20-2019. Prior Therapy Facilty/Provider(s): [facility] [place] and [facility]. Reason for Treatment: Paranoia, SI with plan.   Prior Outpatient Therapy Prior Outpatient Therapy: Yes Prior Therapy Dates: Current. Prior Therapy Facilty/Provider(s): [facility]. Reason for Treatment: Medication management and counseling.  Does patient have an ACCT team?: No Does patient have Intensive In-House Services?  : No Does patient have [facility] services? : No Does patient have P4CC services?: No  ADL Screening (condition at time of admission) Patient's cognitive ability adequate to safely complete daily activities?: Yes Is the patient deaf or have difficulty hearing?: No Does the patient have difficulty seeing, even when wearing glasses/contacts?: Yes(Pt reported, he is supposed to wear glasses. ) Does the patient have difficulty concentrating, remembering, or making decisions?: Yes Patient able to express need for assistance with ADLs?: Yes Does the patient have difficulty dressing or bathing?: No Independently performs ADLs?: Yes (appropriate for developmental age) Does  the patient have difficulty walking or climbing stairs?: No Weakness of Legs:  Both(Pt reported, both leag are in pain due to weakness. ) Weakness of Arms/Hands: None  Home Assistive Devices/Equipment Home Assistive Devices/Equipment: Eyeglasses(Pt reported, needing glassess. )    Abuse/Neglect Assessment (Assessment to be complete while patient is alone) Abuse/Neglect Assessment Can Be Completed: Yes Physical Abuse: Denies(Pt denies. ) Verbal Abuse: Denies(Pt denies. ) Sexual Abuse: Denies(Pt denies. ) Exploitation of patient/patient's resources: Denies(Pt denies. ) Self-Neglect: Denies(Pt denies. )     Advance Directives (For Healthcare) Does Patient Have a Medical Advance Directive?: No    Additional Information 1:1 In Past 12 Months?: No CIRT Risk: No Elopement Risk: No Does patient have medical clearance?: Yes     Disposition: Nira ConnJason Berry, NP recommends overnight observation for safety and stabilization. Disposition discussed with Dr. Rush Landmarkegeler and Baxter HireKristen, RN.   Disposition Initial Assessment Completed for this Encounter: Yes Disposition of Patient: (overnight observation for safety and stabilization.) Patient refused recommended treatment: No Mode of transportation if patient is discharged?: N/A  On Site Evaluation by: Redmond Pullingreylese D Kiearra Oyervides, MS, LPC, CRC.  Reviewed with Physician: Dr. Rush Landmarkegeler and Nira ConnJason Berry, NP.    Redmond Pullingreylese D Hebe Merriwether 06/20/2018 3:46 AM   Redmond Pullingreylese D Thessaly Mccullers, MS, [facility], [facility] Triage Specialist [phone]

## 2018-06-19 NOTE — ED Triage Notes (Addendum)
Per EMS- patient c/o SOB and mid chest pain while walking to the homeless shelter this afternoon at 1700. Patient was given Aspirin  Prior to arrival to the ED.

## 2018-06-20 ENCOUNTER — Other Ambulatory Visit: Payer: Self-pay

## 2018-06-20 ENCOUNTER — Emergency Department (EMERGENCY_DEPARTMENT_HOSPITAL)
Admission: EM | Admit: 2018-06-20 | Discharge: 2018-06-21 | Disposition: A | Discharge status: Home / Self Care | Payer: Medicaid Other | Source: Home / Self Care

## 2018-06-20 ENCOUNTER — Encounter (HOSPITAL_COMMUNITY): Payer: Self-pay | Admitting: *Deleted

## 2018-06-20 DIAGNOSIS — F250 Schizoaffective disorder, bipolar type: Secondary | ICD-10-CM | POA: Diagnosis not present

## 2018-06-20 DIAGNOSIS — F332 Major depressive disorder, recurrent severe without psychotic features: Secondary | ICD-10-CM

## 2018-06-20 LAB — COMPREHENSIVE METABOLIC PANEL
ALBUMIN: 3.9 g/dL (ref 3.5–5.0)
ALT: 28 U/L (ref 0–44)
AST: 24 U/L (ref 15–41)
Alkaline Phosphatase: 81 U/L (ref 38–126)
Anion gap: 10 (ref 5–15)
BUN: 11 mg/dL (ref 6–20)
CALCIUM: 9.2 mg/dL (ref 8.9–10.3)
CHLORIDE: 102 mmol/L (ref 98–111)
CO2: 24 mmol/L (ref 22–32)
CREATININE: 1.31 mg/dL — AB (ref 0.61–1.24)
GFR calc non Af Amer: >60 mL/min (ref >60)
GLUCOSE: 103 mg/dL — AB (ref 70–99)
Potassium: 3.6 mmol/L (ref 3.5–5.1)
SODIUM: 136 mmol/L (ref 135–145)
Total Bilirubin: 0.6 mg/dL (ref 0.3–1.2)
Total Protein: 7.6 g/dL (ref 6.5–8.1)

## 2018-06-20 LAB — CBC WITH DIFFERENTIAL/PLATELET
BASOS PCT: 0 %
Basophils Absolute: 0 10*3/uL (ref 0.0–0.1)
EOS ABS: 0.1 10*3/uL (ref 0.0–0.7)
EOS PCT: 2 %
HCT: 38.9 % — ABNORMAL LOW (ref 39.0–52.0)
Hemoglobin: 13.2 g/dL (ref 13.0–17.0)
Lymphocytes Relative: 39 %
Lymphs Abs: 2.2 10*3/uL (ref 0.7–4.0)
MCH: 28.6 pg (ref 26.0–34.0)
MCHC: 33.9 g/dL (ref 30.0–36.0)
MCV: 84.4 fL (ref 78.0–100.0)
MONO ABS: 0.7 10*3/uL (ref 0.1–1.0)
MONOS PCT: 13 %
NEUTROS PCT: 46 %
Neutro Abs: 2.6 10*3/uL (ref 1.7–7.7)
PLATELETS: 257 10*3/uL (ref 150–400)
RBC: 4.61 MIL/uL (ref 4.22–5.81)
RDW: 13.2 % (ref 11.5–15.5)
WBC: 5.5 10*3/uL (ref 4.0–10.5)

## 2018-06-20 LAB — ETHANOL: Alcohol, Ethyl (B): <10 mg/dL (ref <10)

## 2018-06-20 MED ORDER — AMLODIPINE BESYLATE 5 MG PO TABS
10.0000 mg | ORAL_TABLET | Freq: Every day | ORAL | Status: DC
  Administered 2018-06-21 (×2): 10 mg via ORAL
  Filled 2018-06-20 (×2): qty 2

## 2018-06-20 NOTE — Discharge Instructions (Signed)
For your behavioral health needs, you are advised to continue treatment with Damita DunningsKen Headen, MD:       Damita DunningsKen Headen, MD      [facility]      [address]      [place], [place] [address]      [phone]  For your shelter needs, contact the following service providers:       [facility])      [address]      [place], [place] [address]      [phone]       [facility]      [address]      [place], [place] [address]      [phone]  For day shelter and other supportive services for the homeless, contact the [facility](IR[facility]):       [facility]      [address]      [place], [place] [address]      [phone]  For transitional housing, contact the following agency.  They provide longer term housing than a shelter, but there is an application process:       [facility]      [facility]      [address]      [place], [place] [address]      [phone]

## 2018-06-20 NOTE — Consult Note (Addendum)
[place] Psychiatry Consult   Reason for Consult:  Suicidal thoughts Referring Physician:  EDP Patient Identification: Allen Sosa MRN:  [id number] Principal Diagnosis: Schizoaffective disorder, bipolar type [facility]) Diagnosis:   Patient Active Problem List   Diagnosis Date Noted  . Auditory hallucinations [R44.0]   . Depression [F32.9]   . Suicidal ideation [R45.851]   . Hypertension [I10] 01/09/2017  . Obesity [E66.9] 01/09/2017  . Schizoaffective disorder, bipolar type ([place]) [F25.0] 01/07/2017  . 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 Abdominal pain [R10.9] 11/20/2014  . Abdominal pain in male [R10.9] 11/18/2014    Total Time spent with patient: 45 minutes  Subjective:   Allen Sosa is a 37 y.o. male patient admitted with suicidal thoughts due to his homelessness.  HPI:  Pt was seen and chart reviewed with treatment team and Dr Mariea Clonts. Pt was [facility] form [facility] on 06-13-2018 after a 10 day stay. Pt was instructed to follow up on 06-17-2018 with his outpatient provider at [facility] with Dr Rosine Door on 06-17-2018. Pt states he did attend this appointment and is taking his medications.  Pt denies suicidal/homicidal ideation, denies auditory/visual hallucinations and does not appear to be responding to internal stimuli. Pt states he just feels like he doesn't know what is real and what is not real. Pt also stated he is homeless and has been staying at the local shelter. Pt stated he initially came to the hospital because he was short of breath. After [facility] at the [facility] he realized it was too late to go back to the shelter and said he was having increased suicidal thoughts. Pt's UDS and BAL are negative. Pt is stable and psychiatrically clear for discharge and follow up with his outpatient provider.   Past Psychiatric History: As above  Risk to Self: None Risk to Others: None Prior Inpatient Therapy: Prior Inpatient  Therapy: Yes Prior Therapy Dates: 3-4 years ago. From 06-03-2018-60-20-2019. Prior Therapy Facilty/Provider(s): [place], [place] [facility]. Reason for Treatment: Paranoia, SI with plan.  Prior Outpatient Therapy: Prior Outpatient Therapy: Yes Prior Therapy Dates: Current. Prior Therapy Facilty/Provider(s): [facility]. Reason for Treatment: Medication management and counseling.  Does patient have an ACCT team?: No Does patient have Intensive In-House Services?  : No Does patient have [facility] services? : No Does patient have P4CC services?: No  Past Medical History:  Past Medical History:  Diagnosis Date  . Anxiety   . Depression   . Hyperlipemia   . Hypertension   . Obesity   . Schizo affective schizophrenia ([place])   . Sleep apnea     Past Surgical History:  Procedure Laterality Date  . VASECTOMY     Family History:  Family History  Problem Relation Age of Onset  . Heart attack Mother   . Cancer Mother   . Cancer Sister    Family Psychiatric  History: Unknown Social History:  Social History   Substance and Sexual Activity  Alcohol Use No  . Alcohol/week: 0.0 oz     Social History   Substance and Sexual Activity  Drug Use No    Social History   Socioeconomic History  . Marital status: Married    Spouse name: Not on file  . Number of children: 1  . Years of education: 32  . Highest education level: Not on file  Occupational History  . Occupation: [employer]  Social Needs  . Financial resource strain: Not on file  .  Food insecurity:    Worry: Not on file    Inability: Not on file  . Transportation needs:    Medical: Not on file    Non-medical: Not on file  Tobacco Use  . Smoking status: Never Smoker  . Smokeless tobacco: Never Used  Substance and Sexual Activity  . Alcohol use: No    Alcohol/week: 0.0 oz  . Drug use: No  . Sexual activity: Yes    Birth control/protection: None  Lifestyle  . Physical activity:    Days per week:  Not on file    Minutes per session: Not on file  . Stress: Not on file  Relationships  . Social connections:    Talks on phone: Not on file    Gets together: Not on file    Attends religious service: Not on file    Active member of club or organization: Not on file    Attends meetings of clubs or organizations: Not on file    Relationship status: Not on file  Other Topics Concern  . Not on file  Social History Narrative   Drinks caffeine daily    Additional Social History: N/A    Allergies:  No Known Allergies  Labs:  Results for orders placed or performed during the hospital encounter of 06/19/18 (from the past 48 hour(s))  Comprehensive metabolic panel     Status: Abnormal   Collection Time: 06/19/18  8:29 PM  Result Value Ref Range   Sodium 138 135 - 145 mmol/L   Potassium 4.2 3.5 - 5.1 mmol/L   Chloride 105 98 - 111 mmol/L    Comment: Please note change in reference range.   CO2 25 22 - 32 mmol/L   Glucose, Bld 92 70 - 99 mg/dL    Comment: Please note change in reference range.   BUN 12 6 - 20 mg/dL    Comment: Please note change in reference range.   Creatinine, Ser 1.49 (H) 0.61 - 1.24 mg/dL   Calcium 9.4 8.9 - 10.3 mg/dL   Total Protein 7.7 6.5 - 8.1 g/dL   Albumin 3.6 3.5 - 5.0 g/dL   AST 26 15 - 41 U/L   ALT 27 0 - 44 U/L    Comment: Please note change in reference range.   Alkaline Phosphatase 84 38 - 126 U/L   Total Bilirubin 0.2 (L) 0.3 - 1.2 mg/dL   GFR calc non Af Amer 59 (L) >60 mL/min   GFR calc Af Amer >60 >60 mL/min    Comment: (NOTE) The eGFR has been calculated using the CKD EPI equation. This calculation has not been validated in all clinical situations. eGFR's persistently <60 mL/min signify possible Chronic Kidney Disease.    Anion gap 8 5 - 15    Comment: Performed at [facility], [place] [address], [place], [place] [address]  Ethanol     Status: None   Collection Time: 06/19/18  8:29 PM  Result Value Ref Range    Alcohol, Ethyl (B) <10 <10 mg/dL    Comment: (NOTE) Lowest detectable limit for serum alcohol is 10 mg/dL. For medical purposes only. Performed at [facility], [place] [address] [place],  [address]   Salicylate level     Status: None   Collection Time: 06/19/18  8:29 PM  Result Value Ref Range   Salicylate Lvl <3.8 2.8 - 30.0 mg/dL    Comment: Performed at [facility], [place] [address] [place], [place]  [address]  Acetaminophen level     Status: Abnormal   Collection Time: 06/19/18  8:29 PM  Result Value Ref Range   Acetaminophen (Tylenol), Serum <10 (L) 10 - 30 ug/mL    Comment: (NOTE) Therapeutic concentrations vary significantly. A range of 10-30 ug/mL  may be an effective concentration for many patients. However, some  are best treated at concentrations outside of this range. Acetaminophen concentrations >150 ug/mL at 4 hours after ingestion  and >50 ug/mL at 12 hours after ingestion are often associated with  toxic reactions. Performed at [facility], [place] [address], [place], [place] [address]   cbc     Status: None   Collection Time: 06/19/18  8:29 PM  Result Value Ref Range   WBC 6.4 4.0 - 10.5 K/uL   RBC 4.67 4.22 - 5.81 MIL/uL   Hemoglobin 13.7 13.0 - 17.0 g/dL   HCT 39.0 39.0 - 52.0 %   MCV 83.5 78.0 - 100.0 fL   MCH 29.3 26.0 - 34.0 pg   MCHC 35.1 30.0 - 36.0 g/dL   RDW 13.2 11.5 - 15.5 %   Platelets 271 150 - 400 K/uL    Comment: Performed at [facility], [place] [address] [place], [place] [address]  Troponin I     Status: None   Collection Time: 06/19/18  8:29 PM  Result Value Ref Range   Troponin I <0.03 <0.03 ng/mL    Comment: Performed at [facility], [place] [address], [place], [place] [address]  Rapid urine drug screen (hospital performed)     Status: Abnormal   Collection Time: 06/19/18  8:34 PM  Result Value Ref Range   Opiates NONE DETECTED NONE  DETECTED   Cocaine NONE DETECTED NONE DETECTED   Benzodiazepines NONE DETECTED NONE DETECTED   Amphetamines NONE DETECTED NONE DETECTED   Tetrahydrocannabinol NONE DETECTED NONE DETECTED   Barbiturates (A) NONE DETECTED    Result not available. Reagent lot number recalled by manufacturer.    Comment: Performed at [facility], [place] [address] [place], [place] [address]    Current Facility-Administered Medications  Medication Dose Route Frequency Provider Last Rate Last Dose  . albuterol (PROVENTIL HFA;VENTOLIN HFA) 108 (90 Base) MCG/ACT inhaler 2 puff  2 puff Inhalation Once Tegeler, Gwenyth Allegra, MD       Current Outpatient Medications  Medication Sig Dispense Refill  . amLODipine (NORVASC) 10 MG tablet Take 1 tablet (10 mg total) by mouth daily. For high blood pressure 10 tablet 0  . hydrALAZINE (APRESOLINE) 50 MG tablet TK 1 T PO BID  0  . hydrochlorothiazide (HYDRODIURIL) 12.5 MG tablet TK 1 T  PO D  0  . hydrOXYzine (ATARAX/VISTARIL) 50 MG tablet Take 1 tablet (50 mg total) by mouth 3 (three) times daily as needed for anxiety. 60 tablet 0  . OLANZapine (ZYPREXA) 20 MG tablet TK 1 T PO QD  0  . Oxcarbazepine (TRILEPTAL) 300 MG tablet Take 1 tablet (300 mg total) by mouth 2 (two) times daily. For mood stabilization 60 tablet 0  . traZODone (DESYREL) 100 MG tablet Take 1 tablet (100 mg total) by mouth at bedtime as needed for sleep. 30 tablet 0  . hydrALAZINE (APRESOLINE) 100 MG tablet Take 1 tablet (100 mg total) by mouth daily. For high blood pressure (Patient not taking: Reported on 06/19/2018) 30 tablet 0  . hydrochlorothiazide (MICROZIDE) 12.5 MG capsule Take 1 capsule (12.5 mg total) by mouth daily. For  high blood pressure (Patient not taking: Reported on 06/19/2018) 10 capsule 0  . OLANZapine (ZYPREXA) 15 MG tablet Take 1 tablet (15 mg total) by mouth at bedtime. For mood control (Patient not taking: Reported on 06/19/2018) 30 tablet 0     Musculoskeletal: Strength & Muscle Tone: within normal limits Gait & Station: normal Patient leans: N/A  Psychiatric Specialty Exam: Physical Exam  Nursing note and vitals reviewed. Constitutional: He is oriented to person, place, and time. He appears well-developed and well-nourished.  HENT:  Head: Normocephalic.  Respiratory: Effort normal.  Musculoskeletal: Normal range of motion.  Neurological: He is alert and oriented to person, place, and time.  Psychiatric: His speech is normal and behavior is normal. Thought content normal. Cognition and memory are normal. He expresses impulsivity. He exhibits a depressed mood.    Review of Systems  Psychiatric/Behavioral: Positive for depression. Negative for hallucinations, memory loss, substance abuse and suicidal ideas. The patient is nervous/anxious. The patient does not have insomnia.   All other systems reviewed and are negative.   Blood pressure 131/88, pulse 83, temperature 98.5 F (36.9 C), temperature source Oral, resp. rate 19, height 6' 3" (1.905 m), weight (!) 438 lb (198.7 kg), SpO2 94 %.Body mass index is 54.75 kg/m.  General Appearance: Casual  Eye Contact:  Good  Speech:  Clear and Coherent and Normal Rate  Volume:  Decreased  Mood:  Anxious and Depressed  Affect:  Congruent and Depressed  Thought Process:  Coherent, Goal Directed and Linear  Orientation:  Full (Time, Place, and Person)  Thought Content:  Logical  Suicidal Thoughts:  No  Homicidal Thoughts:  No  Memory:  Immediate;   Good Recent;   Good Remote;   Fair  Judgement:  Poor  Insight:  Lacking  Psychomotor Activity:  Normal  Concentration:  Concentration: Good and Attention Span: Good  Recall:  Good  Fund of Knowledge:  Good  Language:  Good  Akathisia:  No  Handed:  Right  AIMS (if indicated):   N/A  Assets:  Communication Skills Resilience Social Support  ADL's:  Intact  Cognition:  WNL  Sleep:   Good     Treatment Plan  Summary: Plan Schizoaffective disorder, bipolar type ([place])  Discharge Home Follow up at [facility] for medication management Take all medication management.  Disposition: No evidence of imminent risk to self or others at present.   Patient does not meet criteria for psychiatric inpatient admission. Supportive therapy provided about ongoing stressors. Discussed crisis plan, support from social network, calling 911, coming to the Emergency Department, and calling Suicide Hotline.  Ethelene Hal, NP 06/20/2018 11:37 AM   Patient seen face-to-face for psychiatric evaluation, chart reviewed and case discussed with the physician extender and developed treatment plan. Reviewed the information documented and agree with the treatment plan.  Buford Dresser, DO 06/20/18 11:35 PM

## 2018-06-20 NOTE — BH Assessment (Signed)
[facility]  Per Juanetta BeetsJacqueline Norman, DO, this pt does not require psychiatric hospitalization at this time.  [facility] with recommendation to continue treatment with Damita DunningsKen Headen, MD.  This has been included in pt's discharge instructions.  Pt has also been provided with information regarding supportive services for the homeless.  Pt's nurse, Morrie Sheldonshley, has been notified.  Doylene Canninghomas Daysen Gundrum, MA Triage Specialist [phone]

## 2018-06-20 NOTE — ED Provider Notes (Signed)
[facility]-EMERGENCY DEPT Provider Note   CSN: [id number] Arrival date & time: 06/20/18  2058     History   Chief Complaint Chief Complaint  Patient presents with  . Medical Clearance  . Hallucinations    HPI Allen RenMichael Ashraf V. is a 37 y.o. male.  Patient has a history of schizophrenia.  He states the voices are back and telling him to hurt himself and also telling him that he is not safe in the going to get him.  Patient states he would like to have some help  The history is provided by the patient.  Altered Mental Status   This is a recurrent problem. The current episode started more than 2 days ago. The problem has not changed since onset.Associated symptoms include hallucinations. Pertinent negatives include no confusion and no seizures. Risk factors: History of schizophrenia. His past medical history does not include seizures.    Past Medical History:  Diagnosis Date  . Anxiety   . Depression   . Hyperlipemia   . Hypertension   . Obesity   . Schizo affective schizophrenia (HCC)   . Sleep apnea     Patient Active Problem List   Diagnosis Date Noted  . Auditory hallucinations   . Depression   . Suicidal ideation   . Hypertension 01/09/2017  . Obesity 01/09/2017  . Schizoaffective disorder, bipolar type (HCC) 01/07/2017  . 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 Abdominal pain 11/20/2014  . Abdominal pain in male 11/18/2014    Past Surgical History:  Procedure Laterality Date  . VASECTOMY          Home Medications    Prior to Admission medications   Medication Sig Start Date End Date Taking? Authorizing Provider  amLODipine (NORVASC) 10 MG tablet Take 1 tablet (10 mg total) by mouth daily. For high blood pressure 06/13/18   Sanjuana KavaNwoko, Agnes I, NP    Family History Family History  Problem Relation Age of Onset  . Heart attack Mother   . Cancer Mother   . Cancer Sister     Social  History Social History   Tobacco Use  . Smoking status: Never Smoker  . Smokeless tobacco: Never Used  Substance Use Topics  . Alcohol use: No    Alcohol/week: 0.0 oz  . Drug use: No     Allergies   Patient has no known allergies.   Review of Systems Review of Systems  Constitutional: Negative for appetite change and fatigue.  HENT: Negative for congestion, ear discharge and sinus pressure.   Eyes: Negative for discharge.  Respiratory: Negative for cough.   Cardiovascular: Negative for chest pain.  Gastrointestinal: Negative for abdominal pain and diarrhea.  Genitourinary: Negative for frequency and hematuria.  Musculoskeletal: Negative for back pain.  Skin: Negative for rash.  Neurological: Negative for seizures and headaches.  Psychiatric/Behavioral: Positive for hallucinations. Negative for confusion.     Physical Exam Updated Vital Signs BP (!) 157/112 (BP Location: Left Arm)   Pulse 71   Temp 98.1 F (36.7 C) (Oral)   Resp 20   SpO2 99%   Physical Exam  Constitutional: He is oriented to person, place, and time. He appears well-developed.  HENT:  Head: Normocephalic.  Eyes: Conjunctivae and EOM are normal. No scleral icterus.  Neck: Neck supple. No thyromegaly present.  Cardiovascular: Normal rate and regular rhythm. Exam reveals no gallop and no friction rub.  No murmur heard. Pulmonary/Chest: No stridor. He has  no wheezes. He has no rales. He exhibits no tenderness.  Abdominal: He exhibits no distension. There is no tenderness. There is no rebound.  Musculoskeletal: Normal range of motion. He exhibits no edema.  Lymphadenopathy:    He has no cervical adenopathy.  Neurological: He is oriented to person, place, and time. He exhibits normal muscle tone. Coordination normal.  Skin: No rash noted. No erythema.  Psychiatric:  Patient with auditory hallucinations     ED Treatments / Results  Labs (all labs ordered are listed, but only abnormal results  are displayed) Labs Reviewed  CBC WITH DIFFERENTIAL/PLATELET  COMPREHENSIVE METABOLIC PANEL  ETHANOL  RAPID URINE DRUG SCREEN, HOSP PERFORMED    EKG None  Radiology Dg Chest 2 View  Result Date: 06/19/2018 CLINICAL DATA:  Chest pain and shortness of breath. EXAM: CHEST - 2 VIEW COMPARISON:  CTA chest and chest x-ray dated May 12, 2018. FINDINGS: The heart size and mediastinal contours are within normal limits. Both lungs are clear. Unchanged mild elevation of the right hemidiaphragm. The visualized skeletal structures are unremarkable. IMPRESSION: No active cardiopulmonary disease. Electronically Signed   By: Obie Dredge M.D.   On: 06/19/2018 19:28    Procedures Procedures (including critical care time)  Medications Ordered in ED Medications  amLODipine (NORVASC) tablet 10 mg (has no administration in time range)     Initial Impression / Assessment and Plan / ED Course  I have reviewed the triage vital signs and the nursing notes.  Pertinent labs & imaging results that were available during my care of the patient were reviewed by me and considered in my medical decision making (see chart for details).     Patient with schizophrenia and auditory hallucinations telling him to hurt himself and that he is not safe.  He will be seen by behavioral  Final Clinical Impressions(s) / ED Diagnoses   Final diagnoses:  None    ED Discharge Orders    None       Bethann Berkshire, MD 06/20/18 2128

## 2018-06-20 NOTE — BH Assessment (Addendum)
Assessment Note  Allen RenMichael Hoke V. is an 37 y.o. male.  -Clinician reviewed note by Dr. Estell HarpinZammit. Patient has a history of schizophrenia.  He states the voices are back and telling him to hurt himself and also telling him that he is not safe in the going to get him.  Patient states he would like to have some help.  [facility]  [place] felt [place].  He still feels like "soul searchers" are after him and he is not safe out on his own.  Patient says he would jump in front of a car, hang himself, drink bleach to kill himself so that these people don't get him.  He says during the assessment that people are watching us through the television.    Patient says at times he feels close to hitting other people.  He says he feels that way now and knows he needs to come in again.  Patient denies wanting to kill anyone.    Patient hears voices whispering to him and he can understand them sometimes.  He sees people that he thinks are out to do him harm.  He is currently homeless and has no where to go.  [facility] after a 10 day stay. Pt was instructed to follow up on 06-17-2018 [facility] with Dr Omelia BlackwaterHeaden on 06-17-2018. Pt states he did attend this appointment and is taking his medications.  Pt tells this clinician however that he feels his medications are not working for him.  Pt denies use of ETOH or other drugs.  -Clinician discussed patient care with Nira ConnJason Berry, FNP.  He recommends patient be observed overnight for safety.  Clinician informed Dr. Estell HarpinZammit of disposition.   Diagnosis: F25.0 Schizoaffective d/o bipolar type  Past Medical History:  Past Medical History:  Diagnosis Date  . Anxiety   . Depression   . Hyperlipemia   . Hypertension   . Obesity   . Schizo affective schizophrenia (HCC)   . Sleep apnea     Past Surgical History:  Procedure Laterality  Date  . VASECTOMY      Family History:  Family History  Problem Relation Age of Onset  . Heart attack Mother   . Cancer Mother   . Cancer Sister     Social History:  reports that he has never smoked. He has never used smokeless tobacco. He reports that he does not drink alcohol or use drugs.  Additional Social History:  Alcohol / Drug Use Pain Medications: See d/c medications from 06/20 Prescriptions: See d/c medications from 06/20 Over the Counter: See d/c medications from 06/20 History of alcohol / drug use?: No history of alcohol / drug abuse  CIWA: CIWA-Ar BP: (!) 157/112 Pulse Rate: 71 COWS:    Allergies: No Known Allergies  Home Medications:  (Not in a hospital admission)  OB/GYN Status:  No LMP for male patient.  General Assessment Data Location of Assessment: [facility] TTS Assessment: In system Is this a Tele or Face-to-Face Assessment?: Face-to-Face Is this an Initial Assessment or a Re-assessment for this encounter?: Initial Assessment Marital status: Separated Is patient pregnant?: No Pregnancy Status: No Living Arrangements: Other (Comment)(Homeless.  Wife won't let him back in the home.) Can pt return to current living arrangement?: Yes Admission Status: Voluntary Is patient capable of signing voluntary admission?: Yes Referral Source: Self/Family/Friend Insurance type: MCD  Crisis Care Plan Living Arrangements: Other (Comment)(Homeless.  Wife won't let him back in the home.) [facility] of Psychiatrist: [facility].  Name of Therapist: [facility].   Education Status Is patient currently in school?: No Is the patient employed, unemployed or receiving disability?: Receiving disability income  Risk to self with the past 6 months Suicidal Ideation: Yes-Currently Present Has patient been a risk to self within the past 6 months prior to admission? : Yes Suicidal Intent: No-Not Currently/Within Last 6 Months Has patient had any suicidal intent  within the past 6 months prior to admission? : Yes Is patient at risk for suicide?: Yes Suicidal Plan?: Yes-Currently Present Has patient had any suicidal plan within the past 6 months prior to admission? : Yes Specify Current Suicidal Plan: Run into traffic; hang self; drink bleach Access to Means: Yes Specify Access to Suicidal Means: Traffic, bleach, rope What has been your use of drugs/alcohol within the last 12 months?: Pt denies Previous Attempts/Gestures: Yes How many times?: 1 Other Self Harm Risks: None Triggers for Past Attempts: Unknown Intentional Self Injurious Behavior: None Comment - Self Injurious Behavior: Pt denies. Family Suicide History: No Recent stressful life event(s): Financial Problems, Turmoil (Comment)(Does not feel safe due to delusions.) Persecutory voices/beliefs?: Yes Depression: Yes Depression Symptoms: Despondent, Isolating, Feeling worthless/self pity, Loss of interest in usual pleasures Substance abuse history and/or treatment for substance abuse?: No Suicide prevention information given to non-admitted patients: Not applicable  Risk to Others within the past 6 months Homicidal Ideation: No Does patient have any lifetime risk of violence toward others beyond the six months prior to admission? : No Thoughts of Harm to Others: Yes-Currently Present Comment - Thoughts of Harm to Others: Feels close to harming others.   Current Homicidal Intent: No Current Homicidal Plan: No Describe Current Homicidal Plan: None Access to Homicidal Means: No Describe Access to Homicidal Means: Pt denies Identified Victim: No one History of harm to others?: No Assessment of Violence: None Noted Violent Behavior Description: None reported Does patient have access to weapons?: No Criminal Charges Pending?: No Does patient have a court date: No Is patient on probation?: No  Psychosis Hallucinations: Auditory, Visual(People whispering; Sees others watching  him.) Delusions: Persecutory, Grandiose(Being spied upon.  Soul searchers after him.)  Mental Status Report Appearance/Hygiene: Disheveled, Unremarkable Eye Contact: Good Motor Activity: Freedom of movement, Unremarkable Speech: Logical/coherent Level of Consciousness: Alert Mood: Depressed, Anxious, Despair, Helpless, Sad Affect: Apprehensive, Sad Anxiety Level: Moderate Thought Processes: Tangential Judgement: Impaired Orientation: Person, Place, Situation, Time Obsessive Compulsive Thoughts/Behaviors: Minimal  Cognitive Functioning Concentration: Poor Memory: Recent Impaired, Remote Intact Is patient IDD: No Is patient DD?: No Insight: Poor Impulse Control: Fair Appetite: Fair Have you had any weight changes? : No Change Sleep: Decreased Total Hours of Sleep: (Up and down at night) Vegetative Symptoms: None  ADLScreening [facility] Assessment Services) Patient's cognitive ability adequate to safely complete daily activities?: Yes Patient able to express need for assistance with ADLs?: Yes Independently performs ADLs?: Yes (appropriate for developmental age)  Prior Inpatient Therapy Prior Inpatient Therapy: Yes Prior Therapy Dates:  D/C on 06/13/18; 3-4 years ago. From 06-03-2018-60-20-2019. Prior Therapy Facilty/Provider(s): [facility]; [facility] [place] [facility]. Reason for Treatment: Paranoia, SI with plan.   Prior Outpatient Therapy Prior Outpatient Therapy: Yes Prior Therapy Dates: Current. Prior Therapy Facilty/Provider(s): [facility]. Reason for Treatment: Medication management and counseling.  Does patient have an ACCT team?: No(Is interested in this service.) Does patient have  Intensive In-House Services?  : No Does patient have [facility] services? : No Does patient have P4CC services?: No  ADL Screening (condition at time of admission) Patient's cognitive ability adequate to safely complete daily activities?: Yes Is the patient deaf or have  difficulty hearing?: No Does the patient have difficulty seeing, even when wearing glasses/contacts?: No Does the patient have difficulty concentrating, remembering, or making decisions?: Yes Patient able to express need for assistance with ADLs?: Yes Does the patient have difficulty dressing or bathing?: No Independently performs ADLs?: Yes (appropriate for developmental age) Does the patient have difficulty walking or climbing stairs?: No Weakness of Legs: None Weakness of Arms/Hands: None       Abuse/Neglect Assessment (Assessment to be complete while patient is alone) Abuse/Neglect Assessment Can Be Completed: Yes Physical Abuse: Denies Verbal Abuse: Denies Sexual Abuse: Denies Exploitation of patient/patient's resources: Denies Self-Neglect: Denies     Merchant navy officer (For Healthcare) Does Patient Have a Medical Advance Directive?: No Would patient like information on creating a medical advance directive?: No - Patient declined          Disposition:  Disposition Initial Assessment Completed for this Encounter: Yes Patient referred to: Other (Comment)(To be reviiewed by FNP)  On Site Evaluation by:   Reviewed with Physician:    Alexandria Lodge 06/20/2018 10:13 PM

## 2018-06-20 NOTE — ED Notes (Addendum)
Pt to room #40. Pt uninterested. Reports "symptoms" is what brought him to the hospital. Pt reports this is an ongoing problem over the past couple of weeks. [facility]. Pt reports paranoia. encouragement and support provided. Special checks q 15 mins in place for safety, video monitoring in place. Will continue to monitor.

## 2018-06-20 NOTE — ED Notes (Signed)
Per TTS, the plan is to have pt evaluated by psych provider in AM. Pt is resting in room with eyes closed. Pt has even and unlabored respirations. No distress is noted and no needs have been expressed. Will continue to monitor.

## 2018-06-20 NOTE — ED Notes (Signed)
Pt d/c home per MD order. Discharge summary reviewed with pt. Pt verbalizes understanding. Pt denies SI/HI/AVH. Pt signed e-signature. Bus pass provided per pt request. Personal property returned. Ambulatory off unit.

## 2018-06-20 NOTE — ED Triage Notes (Signed)
Pt reports auditory hallucinations telling him to "go ahead and do it" and "you are not safe on the streets."  Pt reports he was here for same yesterday.  [place].  He reports he and his wife are not talking at present.  Pt is A&Ox 4.  He is calm and cooperative.

## 2018-06-20 NOTE — [facility] Suicide Risk Assessment (Signed)
Suicide Risk Assessment  Discharge Assessment   [facility]   Principal Problem: Schizoaffective disorder, bipolar type [facility](HCC) Discharge Diagnoses:  Patient Active Problem List   Diagnosis Date Noted  . Auditory hallucinations [R44.0]   . Depression [F32.9]   . Suicidal ideation [R45.851]   . Hypertension [I10] 01/09/2017  . Obesity [E66.9] 01/09/2017  . Schizoaffective disorder, bipolar type (HCC) [F25.0] 01/07/2017  . 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 Abdominal pain [R10.9] 11/20/2014  . Abdominal pain in male [R10.9] 11/18/2014    Total Time spent with patient: 45 minutes  Musculoskeletal: Strength & Muscle Tone: within normal limits Gait & Station: normal Patient leans: N/A  Psychiatric Specialty Exam: Physical Exam  Constitutional: He is oriented to person, place, and time. He appears well-developed and well-nourished.  HENT:  Head: Normocephalic.  Respiratory: Effort normal.  Musculoskeletal: Normal range of motion.  Neurological: He is alert and oriented to person, place, and time.  Psychiatric: His speech is normal and behavior is normal. Thought content normal. Cognition and memory are normal. He expresses impulsivity. He exhibits a depressed mood.   Review of Systems  Psychiatric/Behavioral: Positive for depression. Negative for hallucinations, memory loss, substance abuse and suicidal ideas. The patient is nervous/anxious. The patient does not have insomnia.   All other systems reviewed and are negative.  Blood pressure 131/88, pulse 83, temperature 98.5 F (36.9 C), temperature source Oral, resp. rate 19, height 6\' 3"  (1.905 m), weight (!) 438 lb (198.7 kg), SpO2 94 %.Body mass index is 54.75 kg/m. General Appearance: Casual Eye Contact:  Good Speech:  Clear and Coherent and Normal Rate Volume:  Decreased Mood:  Anxious and Depressed Affect:  Congruent and Depressed Thought  Process:  Coherent, Goal Directed and Linear Orientation:  Full (Time, Place, and Person) Thought Content:  Logical Suicidal Thoughts:  No Homicidal Thoughts:  No Memory:  Immediate;   Good Recent;   Good Remote;   Fair Judgement:  Poor Insight:  Lacking Psychomotor Activity:  Normal Concentration:  Concentration: Good and Attention Span: Good Recall:  Good Fund of Knowledge:  Good Language:  Good Akathisia:  No Handed:  Right AIMS (if indicated):    Assets:  Communication Skills Resilience Social Support ADL's:  Intact Cognition:  WNL Sleep:   Good   Mental Status Per Nursing Assessment::   On Admission:   Suicidal thoughts related to homelessness  Demographic Factors:  Male, Low socioeconomic status and Unemployed  Loss Factors: Financial problems/change in socioeconomic status  Historical Factors: Impulsivity  Risk Reduction Factors:   Sense of responsibility to family  Continued Clinical Symptoms:  Severe Anxiety and/or Agitation Depression:   Impulsivity  Cognitive Features That Contribute To Risk:  Closed-mindedness    Suicide Risk:  Minimal: No identifiable suicidal ideation.  Patients presenting with no risk factors but with morbid ruminations; may be classified as minimal risk based on the severity of the depressive symptoms    Plan Of Care/Follow-up recommendations:  Activity:  as tolerated Diet:  Heart healthy  Laveda AbbeLaurie Britton Sweden Lesure, NP 06/20/2018, 12:10 PM

## 2018-06-20 NOTE — ED Notes (Signed)
TTS at bedside. 

## 2018-06-20 NOTE — ED Notes (Signed)
Bed: [facility] Expected date:  Expected time:  Means of arrival:  Comments: 

## 2018-06-21 ENCOUNTER — Emergency Department (HOSPITAL_COMMUNITY)
Admission: EM | Admit: 2018-06-21 | Discharge: 2018-06-22 | Disposition: A | Discharge status: Home / Self Care | Payer: Medicaid Other | Attending: Emergency Medicine | Admitting: Emergency Medicine

## 2018-06-21 ENCOUNTER — Encounter (HOSPITAL_COMMUNITY): Payer: Self-pay

## 2018-06-21 DIAGNOSIS — Z9119 Patient's noncompliance with other medical treatment and regimen: Secondary | ICD-10-CM | POA: Diagnosis not present

## 2018-06-21 DIAGNOSIS — F332 Major depressive disorder, recurrent severe without psychotic features: Secondary | ICD-10-CM

## 2018-06-21 DIAGNOSIS — R45851 Suicidal ideations: Secondary | ICD-10-CM | POA: Insufficient documentation

## 2018-06-21 DIAGNOSIS — F333 Major depressive disorder, recurrent, severe with psychotic symptoms: Secondary | ICD-10-CM | POA: Insufficient documentation

## 2018-06-21 DIAGNOSIS — Z79899 Other long term (current) drug therapy: Secondary | ICD-10-CM | POA: Insufficient documentation

## 2018-06-21 DIAGNOSIS — R1010 Upper abdominal pain, unspecified: Secondary | ICD-10-CM | POA: Diagnosis not present

## 2018-06-21 DIAGNOSIS — I10 Essential (primary) hypertension: Secondary | ICD-10-CM | POA: Diagnosis not present

## 2018-06-21 DIAGNOSIS — F250 Schizoaffective disorder, bipolar type: Secondary | ICD-10-CM

## 2018-06-21 DIAGNOSIS — R51 Headache: Secondary | ICD-10-CM | POA: Diagnosis present

## 2018-06-21 LAB — CBC WITH DIFFERENTIAL/PLATELET
BASOS ABS: 0 10*3/uL (ref 0.0–0.1)
BASOS PCT: 0 %
EOS ABS: 0.1 10*3/uL (ref 0.0–0.7)
Eosinophils Relative: 2 %
HCT: 40.1 % (ref 39.0–52.0)
Hemoglobin: 13.8 g/dL (ref 13.0–17.0)
LYMPHS PCT: 41 %
Lymphs Abs: 2.3 10*3/uL (ref 0.7–4.0)
MCH: 28.7 pg (ref 26.0–34.0)
MCHC: 34.4 g/dL (ref 30.0–36.0)
MCV: 83.4 fL (ref 78.0–100.0)
MONO ABS: 0.5 10*3/uL (ref 0.1–1.0)
Monocytes Relative: 9 %
Neutro Abs: 2.7 10*3/uL (ref 1.7–7.7)
Neutrophils Relative %: 48 %
PLATELETS: 267 10*3/uL (ref 150–400)
RBC: 4.81 MIL/uL (ref 4.22–5.81)
RDW: 13.2 % (ref 11.5–15.5)
WBC: 5.7 10*3/uL (ref 4.0–10.5)

## 2018-06-21 LAB — RAPID URINE DRUG SCREEN, HOSP PERFORMED
AMPHETAMINES: NOT DETECTED
Benzodiazepines: NOT DETECTED
Cocaine: NOT DETECTED
Opiates: NOT DETECTED
TETRAHYDROCANNABINOL: NOT DETECTED

## 2018-06-21 LAB — LIPASE, BLOOD: LIPASE: 31 U/L (ref 11–51)

## 2018-06-21 MED ORDER — QUETIAPINE FUMARATE 50 MG PO TABS
50.0000 mg | ORAL_TABLET | Freq: Once | ORAL | Status: AC
  Administered 2018-06-22: 50 mg via ORAL
  Filled 2018-06-21: qty 1

## 2018-06-21 MED ORDER — AMLODIPINE BESYLATE 5 MG PO TABS
10.0000 mg | ORAL_TABLET | Freq: Once | ORAL | Status: AC
  Administered 2018-06-22: 10 mg via ORAL
  Filled 2018-06-21: qty 2

## 2018-06-21 MED ORDER — GI COCKTAIL ~~LOC~~
30.0000 mL | Freq: Once | ORAL | Status: AC
  Administered 2018-06-21: 30 mL via ORAL
  Filled 2018-06-21: qty 30

## 2018-06-21 MED ORDER — OMEPRAZOLE 20 MG PO CPDR: 20.0000 mg | DELAYED_RELEASE_CAPSULE | Freq: Every day | ORAL | 0 refills | Status: AC

## 2018-06-21 MED ORDER — QUETIAPINE FUMARATE 50 MG PO TABS
50.0000 mg | ORAL_TABLET | Freq: Once | ORAL | Status: AC
Start: 2018-06-21 — End: 2018-06-21
  Administered 2018-06-21: 50 mg via ORAL
  Filled 2018-06-21: qty 1

## 2018-06-21 NOTE — ED Notes (Signed)
Bed: [id number] Expected date:  Expected time:  Means of arrival:  Comments: TR 4  

## 2018-06-21 NOTE — Discharge Instructions (Signed)
For your behavioral health needs, you are advised to continue treatment with Damita DunningsKen Headen, MD:       Damita DunningsKen Headen, MD      [facility]      [address]      [place], [place] [address]      [phone]

## 2018-06-21 NOTE — BH Assessment (Signed)
[facility]  Per Juanetta BeetsJacqueline Norman, DO, this pt does not require psychiatric hospitalization at this time.  [facility] to continue treatment with Damita DunningsKen Headen, MD.  This has been included in pt's discharge instructions.  Pt has also been provided with information regarding supportive services for the homeless.  Pt's nurse, Diane, has been notified.  Doylene Canninghomas Barbi Kumagai, MA Triage Specialist [phone]

## 2018-06-21 NOTE — [facility] Suicide Risk Assessment (Cosign Needed)
Suicide Risk Assessment  Discharge Assessment   [facility]   Principal Problem: Schizoaffective disorder, bipolar type [facility](HCC) Discharge Diagnoses:  Patient Active Problem List   Diagnosis Date Noted  . Auditory hallucinations [R44.0]   . Depression [F32.9]   . Suicidal ideation [R45.851]   . Hypertension [I10] 01/09/2017  . Obesity [E66.9] 01/09/2017  . Schizoaffective disorder, bipolar type (HCC) [F25.0] 01/07/2017  . 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 Abdominal pain [R10.9] 11/20/2014  . Abdominal pain in male [R10.9] 11/18/2014    Total Time spent with patient: 45 minutes  Musculoskeletal: Strength & Muscle Tone: within normal limits Gait & Station: normal Patient leans: N/A   Psychiatric Specialty Exam: Physical Exam  Constitutional: He is oriented to person, place, and time. He appears well-developed and well-nourished.  HENT:  Head: Normocephalic.  Respiratory: Effort normal.  Musculoskeletal: Normal range of motion.  Neurological: He is alert and oriented to person, place, and time.  Psychiatric: His speech is normal and behavior is normal. Thought content normal. His mood appears anxious. Cognition and memory are normal. He expresses impulsivity. He exhibits a depressed mood.   Review of Systems  Psychiatric/Behavioral: Positive for depression. Negative for hallucinations, memory loss, substance abuse and suicidal ideas. The patient is nervous/anxious. The patient does not have insomnia.   All other systems reviewed and are negative.  Blood pressure 124/90, pulse 76, temperature 98.1 F (36.7 C), resp. rate 20, SpO2 99 %.There is no height or weight on file to calculate BMI. General Appearance: Casual Eye Contact:  Good Speech:  Clear and Coherent Volume:  Normal Mood:  Anxious and Depressed Affect:  Congruent and Depressed Thought Process:  Coherent, Goal Directed and  Linear Orientation:  Full (Time, Place, and Person) Thought Content:  Logical Suicidal Thoughts:  No Homicidal Thoughts:  No Memory:  Immediate;   Good Recent;   Good Remote;   Fair Judgement:  Fair Insight:  Lacking Psychomotor Activity:  Normal Concentration:  Concentration: Good and Attention Span: Good Recall:  Good Fund of Knowledge:  Good Language:  Good Akathisia:  No Handed:  Right AIMS (if indicated):    Assets:  Communication Skills Resilience Social Support ADL's:  Intact Cognition:  WNL Sleep:   Good   Mental Status Per Nursing Assessment::   On Admission:   Auditory hallucination  Demographic Factors:  Male, Low socioeconomic status and Unemployed  Loss Factors: Financial problems/change in socioeconomic status  Historical Factors: Impulsivity  Risk Reduction Factors:   Sense of responsibility to family  Continued Clinical Symptoms:  Severe Anxiety and/or Agitation Depression:   Impulsivity Schizophrenia:   Paranoid or undifferentiated type  Cognitive Features That Contribute To Risk:  Closed-mindedness    Suicide Risk:  Minimal: No identifiable suicidal ideation.  Patients presenting with no risk factors but with morbid ruminations; may be classified as minimal risk based on the severity of the depressive symptoms    Plan Of Care/Follow-up recommendations:  Activity:  as tolerated Diet:  Heart healthy  Laveda AbbeLaurie Britton Parks, NP 06/21/2018, 1:17 PM

## 2018-06-21 NOTE — ED Notes (Signed)
Pt discharged home. Discharged instructions read to pt who verbalized understanding. All belongings returned to pt who signed for same. Denies SI/HI, is not delusional and not responding to internal stimuli. Escorted pt to the ED exit.    

## 2018-06-21 NOTE — ED Notes (Signed)
Patient currently endorse SI with a plan to jump in front of a car, hang himself, drink bleach to kill himself. Patient presently denies HI/AVH. Plan of care discussed. Encouragement and support provided and safety maintain. Q 15 min safety checks in place and video monitoring.

## 2018-06-21 NOTE — ED Notes (Signed)
Pt c/o pains in his chest. I offered to do an EKG to rule out a cardiac related event. He replied, "no, I don't think it's a heart attack. Is there some blood that you could draw." I told him that we would be able to do an EKG up here in triage. He said that he would just wait until he gets in the back.

## 2018-06-21 NOTE — ED Provider Notes (Signed)
[facility]-EMERGENCY DEPT Provider Note   CSN: [id number] Arrival date & time: 06/21/18  1634     History   Chief Complaint Chief Complaint  Patient presents with  . Headache    HPI Allen Sosa is a 37 y.o. male.  Patient is a 37 year old male with a history of schizoaffective disorder who presents with abdominal pain.  Initially in triage he complained of a headache.  Later he was complaining some chest pain.  Currently he only complains of abdominal pain.  He complains of a burning pain across his upper abdomen and periumbilical area.  He denies any nausea or vomiting.  No diarrhea.  No fevers.  He states he has the same type of burning pain off and on frequently.  He is tried using Maalox and Tums without improvement in symptoms.     Past Medical History:  Diagnosis Date  . Anxiety   . Depression   . Hyperlipemia   . Hypertension   . Obesity   . Schizo affective schizophrenia (HCC)   . Sleep apnea     Patient Active Problem List   Diagnosis Date Noted  . Auditory hallucinations   . Depression   . Suicidal ideation   . Hypertension 01/09/2017  . Obesity 01/09/2017  . Schizoaffective disorder, bipolar type (HCC) 01/07/2017  . 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 Abdominal pain 11/20/2014  . Abdominal pain in male 11/18/2014    Past Surgical History:  Procedure Laterality Date  . VASECTOMY          Home Medications    Prior to Admission medications   Medication Sig Start Date End Date Taking? Authorizing Provider  amLODipine (NORVASC) 10 MG tablet Take 1 tablet (10 mg total) by mouth daily. For high blood pressure 06/13/18  Yes Nwoko, Agnes I, NP  QUEtiapine (SEROQUEL) 50 MG tablet Take 50 mg by mouth 2 (two) times daily.   Yes [provider]  omeprazole (PRILOSEC) 20 MG capsule Take 1 capsule (20 mg total) by mouth daily. 06/21/18   Rolan Bucco, MD    Family  History Family History  Problem Relation Age of Onset  . Heart attack Mother   . Cancer Mother   . Cancer Sister     Social History Social History   Tobacco Use  . Smoking status: Never Smoker  . Smokeless tobacco: Never Used  Substance Use Topics  . Alcohol use: No    Alcohol/week: 0.0 oz  . Drug use: No     Allergies   Patient has no known allergies.   Review of Systems Review of Systems  Constitutional: Negative for chills, diaphoresis, fatigue and fever.  HENT: Negative for congestion, rhinorrhea and sneezing.   Eyes: Negative.   Respiratory: Negative for cough, chest tightness and shortness of breath.   Cardiovascular: Negative for chest pain and leg swelling.  Gastrointestinal: Positive for abdominal pain. Negative for blood in stool, diarrhea, nausea and vomiting.  Genitourinary: Negative for difficulty urinating, flank pain, frequency and hematuria.  Musculoskeletal: Negative for arthralgias and back pain.  Skin: Negative for rash.  Neurological: Negative for dizziness, speech difficulty, weakness, numbness and headaches.     Physical Exam Updated Vital Signs BP (!) 163/116 (BP Location: Right Arm)   Pulse 90   Temp 98.5 F (36.9 C) (Oral)   Resp 18   Ht 6\' 3"  (1.905 m)   Wt (!) 198.7 kg (438 lb)   SpO2 97%  BMI 54.75 kg/m   Physical Exam  Constitutional: He is oriented to person, place, and time. He appears well-developed and well-nourished.  HENT:  Head: Normocephalic and atraumatic.  Eyes: Pupils are equal, round, and reactive to light.  Neck: Normal range of motion. Neck supple.  Cardiovascular: Normal rate, regular rhythm and normal heart sounds.  Pulmonary/Chest: Effort normal and breath sounds normal. No respiratory distress. He has no wheezes. He has no rales. He exhibits no tenderness.  Abdominal: Soft. Bowel sounds are normal. There is tenderness. There is no rebound and no guarding.  Tenderness across the upper abdomen   Musculoskeletal: Normal range of motion. He exhibits no edema.  Lymphadenopathy:    He has no cervical adenopathy.  Neurological: He is alert and oriented to person, place, and time.  Skin: Skin is warm and dry. No rash noted.  Psychiatric: He has a normal mood and affect.     ED Treatments / Results  Labs (all labs ordered are listed, but only abnormal results are displayed) Labs Reviewed  LIPASE, BLOOD  CBC WITH DIFFERENTIAL/PLATELET    EKG None  Radiology No results found.  Procedures Procedures (including critical care time)  Medications Ordered in ED Medications  gi cocktail (Maalox,Lidocaine,Donnatal) (30 mLs Oral Given 06/21/18 2153)     Initial Impression / Assessment and Plan / ED Course  I have reviewed the triage vital signs and the nursing notes.  Pertinent labs & imaging results that were available during my care of the patient were reviewed by me and considered in my medical decision making (see chart for details).     Patient is a 37 year old male who presents with complaints of upper abdominal pain.  This was after he had 2 other complaints in triage.  The only thing he states now is he is having trouble with his upper abdomen.  He has a benign abdominal exam.  He had labs done last night which showed normal LFTs.  I rechecked a CBC which shows a normal white count.  His lipase is normal.  There is no suggestions of pancreatitis.  He has had no vomiting in the ED.  He is tolerating oral fluids.  He was given a GI cocktail with some improvement in symptoms.  He does not have pain around his gallbladder although his exam is somewhat limited due to his obesity.  At this point given his normal lab work and lack of other clinical findings, I do not feel that he needs further imaging.  He was given a prescription for Prilosec and encouraged to follow-up with her PCP.  Return precautions were given.  Final Clinical Impressions(s) / ED Diagnoses   Final diagnoses:   Upper abdominal pain    ED Discharge Orders        Ordered    omeprazole (PRILOSEC) 20 MG capsule  Daily     06/21/18 2313       Rolan BuccoBelfi, Juletta Berhe, MD 06/21/18 2316

## 2018-06-21 NOTE — Consult Note (Addendum)
[facility] Consult   Reason for Consult:  Auditory hallucinations Referring Physician:  EDP Patient Identification: Allen Sosa MRN:  [id number] Principal Diagnosis: Schizoaffective disorder, bipolar type [facility]) Diagnosis:   Patient Active Problem List   Diagnosis Date Noted  . Auditory hallucinations [R44.0]   . Depression [F32.9]   . Suicidal ideation [R45.851]   . Hypertension [I10] 01/09/2017  . Obesity [E66.9] 01/09/2017  . Schizoaffective disorder, bipolar type ([place]) [F25.0] 01/07/2017  . 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 Abdominal pain [R10.9] 11/20/2014  . Abdominal pain in male [R10.9] 11/18/2014    Total Time spent with patient: 45 minutes  Subjective:   Allen Sosa is a 37 y.o. male patient admitted with auditory hallucinations.  HPI:  Pt was seen and chart reviewed with treatment team and Dr Mariea Clonts. Pt denies suicidal/homicidal ideation, denies auditory/visual hallucinations and does not appear to be responding to internal stimuli. Pt was seen in the [facility] on 06-20-2018 and discharged. Pt returned to the [facility] the same evening stating that he was having auditory hallucinations. Pt is homeless and is followed by Dr Rosine Door for medication management and therapy. Pt was seen in that office on 06-17-2018 and has prescriptions waiting at [facility]. Pt has been demanding food, drink, and Seroquel. Pt was given a dose of Seroquel 50 mg this AM and will be discharged so he can get to the pharmacy and get his medications. Pt is at his baseline and is stable and psychiatrically clear for discharge.   Past Psychiatric History: As above  Risk to Self: None Risk to Others: None Prior Inpatient Therapy: Prior Inpatient Therapy: Yes Prior Therapy Dates:  D/C on 06/13/18; 3-4 years ago. From 06-03-2018-60-20-2019. Prior Therapy Facilty/Provider(s): [place]; [place], [place] and [place]. Reason for  Treatment: Paranoia, SI with plan.  Prior Outpatient Therapy: Prior Outpatient Therapy: Yes Prior Therapy Dates: Current. Prior Therapy Facilty/Provider(s): [facility]. Reason for Treatment: Medication management and counseling.  Does patient have an ACCT team?: No(Is interested in this service.) Does patient have Intensive In-House Services?  : No Does patient have [facility] services? : No Does patient have P4CC services?: No  Past Medical History:  Past Medical History:  Diagnosis Date  . Anxiety   . Depression   . Hyperlipemia   . Hypertension   . Obesity   . Schizo affective schizophrenia ([place])   . Sleep apnea     Past Surgical History:  Procedure Laterality Date  . VASECTOMY     Family History:  Family History  Problem Relation Age of Onset  . Heart attack Mother   . Cancer Mother   . Cancer Sister    Family Psychiatric  History: Unknown Social History:  Social History   Substance and Sexual Activity  Alcohol Use No  . Alcohol/week: 0.0 oz     Social History   Substance and Sexual Activity  Drug Use No    Social History   Socioeconomic History  . Marital status: Married    Spouse name: Not on file  . Number of children: 1  . Years of education: 42  . Highest education level: Not on file  Occupational History  . Occupation: [employer]  Social Needs  . Financial resource strain: Not on file  . Food insecurity:    Worry: Not on file    Inability: Not on file  . Transportation needs:    Medical: Not on file  Non-medical: Not on file  Tobacco Use  . Smoking status: Never Smoker  . Smokeless tobacco: Never Used  Substance and Sexual Activity  . Alcohol use: No    Alcohol/week: 0.0 oz  . Drug use: No  . Sexual activity: Yes    Birth control/protection: None  Lifestyle  . Physical activity:    Days per week: Not on file    Minutes per session: Not on file  . Stress: Not on file  Relationships  . Social connections:    Talks on phone:  Not on file    Gets together: Not on file    Attends religious service: Not on file    Active member of club or organization: Not on file    Attends meetings of clubs or organizations: Not on file    Relationship status: Not on file  Other Topics Concern  . Not on file  Social History Narrative   Drinks caffeine daily    Additional Social History: N/A    Allergies:  No Known Allergies  Labs:  Results for orders placed or performed during the hospital encounter of 06/20/18 (from the past 48 hour(s))  CBC with Differential/Platelet     Status: Abnormal   Collection Time: 06/20/18  9:51 PM  Result Value Ref Range   WBC 5.5 4.0 - 10.5 K/uL   RBC 4.61 4.22 - 5.81 MIL/uL   Hemoglobin 13.2 13.0 - 17.0 g/dL   HCT 38.9 (L) 39.0 - 52.0 %   MCV 84.4 78.0 - 100.0 fL   MCH 28.6 26.0 - 34.0 pg   MCHC 33.9 30.0 - 36.0 g/dL   RDW 13.2 11.5 - 15.5 %   Platelets 257 150 - 400 K/uL   Neutrophils Relative % 46 %   Neutro Abs 2.6 1.7 - 7.7 K/uL   Lymphocytes Relative 39 %   Lymphs Abs 2.2 0.7 - 4.0 K/uL   Monocytes Relative 13 %   Monocytes Absolute 0.7 0.1 - 1.0 K/uL   Eosinophils Relative 2 %   Eosinophils Absolute 0.1 0.0 - 0.7 K/uL   Basophils Relative 0 %   Basophils Absolute 0.0 0.0 - 0.1 K/uL    Comment: Performed at [facility], [place] [address] [place], [place] [address]  Comprehensive metabolic panel     Status: Abnormal   Collection Time: 06/20/18  9:51 PM  Result Value Ref Range   Sodium 136 135 - 145 mmol/L   Potassium 3.6 3.5 - 5.1 mmol/L   Chloride 102 98 - 111 mmol/L    Comment: Please note change in reference range.   CO2 24 22 - 32 mmol/L   Glucose, Bld 103 (H) 70 - 99 mg/dL    Comment: Please note change in reference range.   BUN 11 6 - 20 mg/dL    Comment: Please note change in reference range.   Creatinine, Ser 1.31 (H) 0.61 - 1.24 mg/dL   Calcium 9.2 8.9 - 10.3 mg/dL   Total Protein 7.6 6.5 - 8.1 g/dL   Albumin 3.9 3.5 - 5.0 g/dL   AST 24  15 - 41 U/L   ALT 28 0 - 44 U/L    Comment: Please note change in reference range.   Alkaline Phosphatase 81 38 - 126 U/L   Total Bilirubin 0.6 0.3 - 1.2 mg/dL   GFR calc non Af Amer >60 >60 mL/min   GFR calc Af Amer >60 >60 mL/min    Comment: (NOTE) The eGFR has been calculated  using the CKD EPI equation. This calculation has not been validated in all clinical situations. eGFR's persistently <60 mL/min signify possible Chronic Kidney Disease.    Anion gap 10 5 - 15    Comment: Performed at [facility], [place] [address] [place], [place] [address]  Ethanol     Status: None   Collection Time: 06/20/18  9:51 PM  Result Value Ref Range   Alcohol, Ethyl (B) <10 <10 mg/dL    Comment: (NOTE) Lowest detectable limit for serum alcohol is 10 mg/dL. For medical purposes only. Performed at [facility], [place] [address], [place], [place] [address]   Rapid urine drug screen (hospital performed)     Status: Abnormal   Collection Time: 06/21/18  1:06 AM  Result Value Ref Range   Opiates NONE DETECTED NONE DETECTED   Cocaine NONE DETECTED NONE DETECTED   Benzodiazepines NONE DETECTED NONE DETECTED   Amphetamines NONE DETECTED NONE DETECTED   Tetrahydrocannabinol NONE DETECTED NONE DETECTED   Barbiturates (A) NONE DETECTED    Result not available. Reagent lot number recalled by manufacturer.    Comment: Performed at [facility], [place] [address], [place], [place] [address]    Current Facility-Administered Medications  Medication Dose Route Frequency Provider Last Rate Last Dose  . amLODipine (NORVASC) tablet 10 mg  10 mg Oral Daily Milton Ferguson, MD   10 mg at 06/21/18 1102   Current Outpatient Medications  Medication Sig Dispense Refill  . amLODipine (NORVASC) 10 MG tablet Take 1 tablet (10 mg total) by mouth daily. For high blood pressure 10 tablet 0    Musculoskeletal: Strength & Muscle Tone: within normal limits Gait &  Station: normal Patient leans: N/A  Psychiatric Specialty Exam: Physical Exam  Nursing note and vitals reviewed. Constitutional: He is oriented to person, place, and time. He appears well-developed and well-nourished.  HENT:  Head: Normocephalic and atraumatic.  Neck: Normal range of motion.  Respiratory: Effort normal.  Musculoskeletal: Normal range of motion.  Neurological: He is alert and oriented to person, place, and time.  Psychiatric: His speech is normal and behavior is normal. Thought content normal. His mood appears anxious. Cognition and memory are normal. He expresses impulsivity. He exhibits a depressed mood.    Review of Systems  Psychiatric/Behavioral: Positive for depression. Negative for hallucinations, memory loss, substance abuse and suicidal ideas. The patient is nervous/anxious. The patient does not have insomnia.   All other systems reviewed and are negative.   Blood pressure 124/90, pulse 76, temperature 98.1 F (36.7 C), resp. rate 20, SpO2 99 %.There is no height or weight on file to calculate BMI.  General Appearance: Casual  Eye Contact:  Good  Speech:  Clear and Coherent  Volume:  Normal  Mood:  Anxious and Depressed  Affect:  Congruent and Depressed  Thought Process:  Coherent, Goal Directed and Linear  Orientation:  Full (Time, Place, and Person)  Thought Content:  Logical  Suicidal Thoughts:  No  Homicidal Thoughts:  No  Memory:  Immediate;   Good Recent;   Good Remote;   Fair  Judgement:  Fair  Insight:  Lacking  Psychomotor Activity:  Normal  Concentration:  Concentration: Good and Attention Span: Good  Recall:  Good  Fund of Knowledge:  Good  Language:  Good  Akathisia:  No  Handed:  Right  AIMS (if indicated):   N/A  Assets:  Communication Skills Resilience Social Support  ADL's:  Intact  Cognition:  WNL  Sleep:   Good     Treatment Plan Summary: Plan Schizoaffective disorder, bipolar type [facility])  Discharge Home Take all  medications as prescribed Follow up with Dr Rosine Door for medication management  Disposition: No evidence of imminent risk to self or others at present.   Patient does not meet criteria for psychiatric inpatient admission. Supportive therapy provided about ongoing stressors. Discussed crisis plan, support from social network, calling 911, coming to the Emergency Department, and calling Suicide Hotline.  Ethelene Hal, NP 06/21/2018 1:10 PM   Patient seen face-to-face for psychiatric evaluation, chart reviewed and case discussed with the physician extender and developed treatment plan. Reviewed the information documented and agree with the treatment plan.  Buford Dresser, DO 06/21/18 4:20 PM

## 2018-06-21 NOTE — ED Triage Notes (Signed)
Patient c/o headache when he woke this AM.Patient denies N/V or sensitivity to light.

## 2018-06-22 ENCOUNTER — Other Ambulatory Visit: Payer: Self-pay

## 2018-06-22 ENCOUNTER — Emergency Department (HOSPITAL_COMMUNITY)
Admission: EM | Admit: 2018-06-22 | Discharge: 2018-06-23 | Disposition: A | Discharge status: Home / Self Care | Payer: Medicaid Other | Source: Home / Self Care | Attending: Emergency Medicine | Admitting: Emergency Medicine

## 2018-06-22 ENCOUNTER — Encounter (HOSPITAL_COMMUNITY): Payer: Self-pay | Admitting: Emergency Medicine

## 2018-06-22 DIAGNOSIS — F332 Major depressive disorder, recurrent severe without psychotic features: Secondary | ICD-10-CM

## 2018-06-22 DIAGNOSIS — R45851 Suicidal ideations: Secondary | ICD-10-CM

## 2018-06-22 LAB — ETHANOL: Alcohol, Ethyl (B): <10 mg/dL (ref <10)

## 2018-06-22 LAB — COMPREHENSIVE METABOLIC PANEL
ALBUMIN: 3.9 g/dL (ref 3.5–5.0)
ALK PHOS: 82 U/L (ref 38–126)
ALT: 31 U/L (ref 0–44)
AST: 27 U/L (ref 15–41)
Anion gap: 10 (ref 5–15)
BUN: 9 mg/dL (ref 6–20)
CALCIUM: 9.2 mg/dL (ref 8.9–10.3)
CHLORIDE: 102 mmol/L (ref 98–111)
CO2: 24 mmol/L (ref 22–32)
Creatinine, Ser: 1.42 mg/dL — ABNORMAL HIGH (ref 0.61–1.24)
GFR calc non Af Amer: >60 mL/min (ref >60)
GLUCOSE: 114 mg/dL — AB (ref 70–99)
Potassium: 3.4 mmol/L — ABNORMAL LOW (ref 3.5–5.1)
SODIUM: 136 mmol/L (ref 135–145)
Total Bilirubin: 0.7 mg/dL (ref 0.3–1.2)
Total Protein: 7.7 g/dL (ref 6.5–8.1)

## 2018-06-22 LAB — RAPID URINE DRUG SCREEN, HOSP PERFORMED
AMPHETAMINES: NOT DETECTED
Benzodiazepines: NOT DETECTED
Cocaine: NOT DETECTED
OPIATES: NOT DETECTED
TETRAHYDROCANNABINOL: NOT DETECTED

## 2018-06-22 LAB — CBC
HEMATOCRIT: 39.5 % (ref 39.0–52.0)
HEMOGLOBIN: 13.5 g/dL (ref 13.0–17.0)
MCH: 28.5 pg (ref 26.0–34.0)
MCHC: 34.2 g/dL (ref 30.0–36.0)
MCV: 83.3 fL (ref 78.0–100.0)
Platelets: 254 10*3/uL (ref 150–400)
RBC: 4.74 MIL/uL (ref 4.22–5.81)
RDW: 12.5 % (ref 11.5–15.5)
WBC: 6.7 10*3/uL (ref 4.0–10.5)

## 2018-06-22 LAB — SALICYLATE LEVEL

## 2018-06-22 LAB — ACETAMINOPHEN LEVEL: Acetaminophen (Tylenol), Serum: <10 ug/mL — ABNORMAL LOW (ref 10–30)

## 2018-06-22 MED ORDER — LAMOTRIGINE 100 MG PO TABS
200.0000 mg | ORAL_TABLET | Freq: Two times a day (BID) | ORAL | Status: DC
  Administered 2018-06-22 – 2018-06-23 (×2): 200 mg via ORAL
  Filled 2018-06-22 (×2): qty 2

## 2018-06-22 MED ORDER — AMLODIPINE BESYLATE 5 MG PO TABS
10.0000 mg | ORAL_TABLET | Freq: Every day | ORAL | Status: DC
Start: 2018-06-22 — End: 2018-06-23
  Administered 2018-06-22 – 2018-06-23 (×2): 10 mg via ORAL
  Filled 2018-06-22 (×2): qty 2

## 2018-06-22 MED ORDER — QUETIAPINE FUMARATE 50 MG PO TABS
50.0000 mg | ORAL_TABLET | Freq: Three times a day (TID) | ORAL | Status: DC
  Administered 2018-06-22 – 2018-06-23 (×2): 50 mg via ORAL
  Filled 2018-06-22 (×2): qty 1

## 2018-06-22 MED ORDER — PANTOPRAZOLE SODIUM 40 MG PO TBEC
40.0000 mg | DELAYED_RELEASE_TABLET | Freq: Every day | ORAL | Status: DC
  Administered 2018-06-22 – 2018-06-23 (×2): 40 mg via ORAL
  Filled 2018-06-22 (×2): qty 1

## 2018-06-22 MED ORDER — QUETIAPINE FUMARATE 50 MG PO TABS
50.0000 mg | ORAL_TABLET | Freq: Two times a day (BID) | ORAL | Status: DC
  Administered 2018-06-22 (×2): 50 mg via ORAL
  Filled 2018-06-22 (×2): qty 1

## 2018-06-22 NOTE — Progress Notes (Signed)
TTS consult ordered for pt. Spoke with Dorathy DaftKayla, RN who states the pt is currently in the hallway and requested 15 minutes in order to allow for time to move the pt to a private room for the assessment.  Princess BruinsAquicha Bethann Qualley, MSW, LCSW Therapeutic Triage Specialist  [phone]

## 2018-06-22 NOTE — ED Triage Notes (Signed)
Pt arrived to triage with [facility].  States he is suicidal and hearing voices.  [facility]

## 2018-06-22 NOTE — ED Provider Notes (Signed)
[facility] EMERGENCY DEPARTMENT Provider Note   CSN: [id number] Arrival date & time: 06/22/18  0156     History   Chief Complaint Chief Complaint  Patient presents with  . Suicidal    HPI Allen Sosa is a 37 y.o. male.  The history is provided by the patient and medical records.    37 year old male with history of anxiety, depression, hyperlipidemia, hypertension, obesity, schizoaffective schizophrenia, sleep apnea, presenting to the ED for suicidal ideation.  Patient reports worsening depression over the past several weeks.  When asked about if there is anything specific he states "life in general".  He will not give me any specific examples.  He does tell me that there is some issues in his family right now.  States it is getting to the point where he thinks it would be easier if he just killed himself.  He does not give me any specific plan of suicide.  Patient states he is no longer caring for himself, not bathing regularly, not eating well, and sleeping all the time.  States he just has no motivation to get up and do anything.  Does report he has been hearing voices, at times he hears conversations, other times he hears the voices telling him commands.  He denies any visual hallucinations.  When asked about homicidal ideation, patient states now, then retracts this statement and reports "well if I can get my hand on a cops gun...." but then never finished his sentence.  Reports he was restarted on Seroquel but has not picked up his prescription from the pharmacy.  Denies illicit drug use, denies alcohol abuse.  Past Medical History:  Diagnosis Date  . Anxiety   . Depression   . Hyperlipemia   . Hypertension   . Obesity   . Schizo affective schizophrenia (HCC)   . Sleep apnea     Patient Active Problem List   Diagnosis Date Noted  . Auditory hallucinations   . Depression   . Suicidal ideation   . Hypertension 01/09/2017  . Obesity 01/09/2017  .  Schizoaffective disorder, bipolar type (HCC) 01/07/2017  . 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 Abdominal pain 11/20/2014  . Abdominal pain in male 11/18/2014    Past Surgical History:  Procedure Laterality Date  . VASECTOMY          Home Medications    Prior to Admission medications   Medication Sig Start Date End Date Taking? Authorizing Provider  amLODipine (NORVASC) 10 MG tablet Take 1 tablet (10 mg total) by mouth daily. For high blood pressure 06/13/18   Nwoko, Nicole Kindred I, NP  omeprazole (PRILOSEC) 20 MG capsule Take 1 capsule (20 mg total) by mouth daily. 06/21/18   Rolan Bucco, MD  QUEtiapine (SEROQUEL) 50 MG tablet Take 50 mg by mouth 2 (two) times daily.    [provider]    Family History Family History  Problem Relation Age of Onset  . Heart attack Mother   . Cancer Mother   . Cancer Sister     Social History Social History   Tobacco Use  . Smoking status: Never Smoker  . Smokeless tobacco: Never Used  Substance Use Topics  . Alcohol use: No    Alcohol/week: 0.0 oz  . Drug use: No     Allergies   Patient has no known allergies.   Review of Systems Review of Systems  Psychiatric/Behavioral: Positive for suicidal ideas.  Physical Exam Updated Vital Signs BP (!) 142/101 (BP Location: Right Arm)   Pulse (!) 105   Temp 98.1 F (36.7 C) (Oral)   Resp 20   SpO2 97%   Physical Exam  Constitutional: He is oriented to person, place, and time. He appears well-developed and well-nourished.  HENT:  Head: Normocephalic and atraumatic.  Mouth/Throat: Oropharynx is clear and moist.  Eyes: Pupils are equal, round, and reactive to light. Conjunctivae and EOM are normal.  Neck: Normal range of motion.  Cardiovascular: Normal rate, regular rhythm and normal heart sounds.  Pulmonary/Chest: Effort normal and breath sounds normal.  Abdominal: Soft. Bowel sounds are normal.  Musculoskeletal: Normal  range of motion.  Neurological: He is alert and oriented to person, place, and time.  Skin: Skin is warm and dry.  Psychiatric: He has a normal mood and affect. He is actively hallucinating. He expresses suicidal ideation. He expresses no suicidal plans and no homicidal plans.  Nursing note and vitals reviewed.    ED Treatments / Results  Labs (all labs ordered are listed, but only abnormal results are displayed) Labs Reviewed  COMPREHENSIVE METABOLIC PANEL - Abnormal; Notable for the following components:      Result Value   Potassium 3.4 (*)    Glucose, Bld 114 (*)    Creatinine, Ser 1.42 (*)    All other components within normal limits  ACETAMINOPHEN LEVEL - Abnormal; Notable for the following components:   Acetaminophen (Tylenol), Serum <10 (*)    All other components within normal limits  RAPID URINE DRUG SCREEN, HOSP PERFORMED - Abnormal; Notable for the following components:   Barbiturates   (*)    Value: Result not available. Reagent lot number recalled by manufacturer.   All other components within normal limits  ETHANOL  SALICYLATE LEVEL  CBC    EKG None  Radiology No results found.  Procedures Procedures (including critical care time)  Medications Ordered in ED Medications - No data to display   Initial Impression / Assessment and Plan / ED Course  I have reviewed the triage vital signs and the nursing notes.  Pertinent labs & imaging results that were available during my care of the patient were reviewed by me and considered in my medical decision making (see chart for details).  37 year old male here with worsening depression.  Now reports suicidal ideation and some hallucinations.  Was to be restarted on Seroquel, however has not picked this up from the pharmacy.  He denies any physical complaints at this time.  Screening labs overall reassuring.  Patient medically cleared.  TTS has evaluated and recommends inpatient placement.  Home meds ordered. Patient  remains stable at this time.  Final Clinical Impressions(s) / ED Diagnoses   Final diagnoses:  Severe episode of recurrent major depressive disorder, without psychotic features [facility](HCC)  Suicidal ideation    ED Discharge Orders    None       Garlon HatchetSanders, Camil Hausmann M, PA-C 06/22/18 0555    Nira Connardama, Pedro Eduardo, MD 06/23/18 [phone]

## 2018-06-22 NOTE — BH Assessment (Addendum)
Tele Assessment Note   Patient Name: Allen Sosa MRN: [id number] Referring Physician: Oletha Blend Location of Patient: [facility] Location of Provider: Behavioral Health TTS Department  Carney Saxton is an 37 y.o. male who presents to the ED voluntarily. Pt has 12 ED visits in the past 6 months. Pt has been assessed by TTS on 05/31/18, 06/03/18, 06/20/18, and 06/19/18. This makes the pt's 5th TTS assessment during the month of June. Pt was also admitted to [facility] for inpt treatment from 06/03/18-06/13/18. Pt states he is now back in the ED because he is suicidal with a plan to grab a gun from a police officer and shoot himself. Pt also states he wants to shoot others. When asked who he states "just anyone." Pt states his wife put him out of the home and he has been living in a shelter. Pt states he feels he is at rock bottom. Pt is followed by Dr. Omelia Blackwater, MD for med management needs. Pt states he is prescribed Seroquel but states he has not been consistent with taking his medication. Pt states he has attempted suicide in the past in 2001. Pt states he cut his wrists in 2001 in a suicide attempt. Pt endorses AVH and states he hears people having conversations in his head.   TTS consulted with Donell Sievert, PA who recommends inpt treatment. Pt's nurse Thurnell Garbe, RN and EDP Garlon Hatchet, PA-C have been advised of the disposition. TTS to seek placement.  Diagnosis: MDD, recurrent, severe, w/ psychosis   Past Medical History:  Past Medical History:  Diagnosis Date  . Anxiety   . Depression   . Hyperlipemia   . Hypertension   . Obesity   . Schizo affective schizophrenia (HCC)   . Sleep apnea     Past Surgical History:  Procedure Laterality Date  . VASECTOMY      Family History:  Family History  Problem Relation Age of Onset  . Heart attack Mother   . Cancer Mother   . Cancer Sister     Social History:  reports that he has never smoked. He has never used smokeless  tobacco. He reports that he does not drink alcohol or use drugs.  Additional Social History:  Alcohol / Drug Use Pain Medications: See MAR Prescriptions: See MAR Over the Counter: See MAR History of alcohol / drug use?: No history of alcohol / drug abuse  CIWA: CIWA-Ar BP: 132/78 Pulse Rate: 77 COWS:    Allergies: No Known Allergies  Home Medications:  (Not in a hospital admission)  OB/GYN Status:  No LMP for male patient.  General Assessment Data Location of Assessment: [facility] ED TTS Assessment: In system Is this a Tele or Face-to-Face Assessment?: Tele Assessment Is this an Initial Assessment or a Re-assessment for this encounter?: Initial Assessment Marital status: Separated Is patient pregnant?: No Pregnancy Status: No Living Arrangements: Other (Comment)(homeless) Can pt return to current living arrangement?: Yes Admission Status: Voluntary Is patient capable of signing voluntary admission?: Yes Referral Source: Self/Family/Friend Insurance type: Medicaid     Crisis Care Plan Living Arrangements: Other (Comment)(homeless) Name of Psychiatrist: Dr. Omelia Blackwater, MD - [facility].  Name of Therapist: [facility].   Education Status Is patient currently in school?: No Is the patient employed, unemployed or receiving disability?: Receiving disability income  Risk to self with the past 6 months Suicidal Ideation: Yes-Currently Present Has patient been a risk to self within the past 6 months prior to  admission? : Yes Suicidal Intent: Yes-Currently Present Has patient had any suicidal intent within the past 6 months prior to admission? : Yes Is patient at risk for suicide?: Yes Suicidal Plan?: Yes-Currently Present Has patient had any suicidal plan within the past 6 months prior to admission? : Yes Specify Current Suicidal Plan: pt states he has a plan to grab a police officers gun and shoot himself  Access to Means: Yes Specify Access to Suicidal Means: pt has  access to police officers What has been your use of drugs/alcohol within the last 12 months?: denies use  Previous Attempts/Gestures: Yes How many times?: 1 Triggers for Past Attempts: Unpredictable Intentional Self Injurious Behavior: Cutting Comment - Self Injurious Behavior: pt reports a hx of cutting in 2001 Family Suicide History: No Recent stressful life event(s): Conflict (Comment), Divorce, Recent negative physical changes(conflict with wife) Persecutory voices/beliefs?: No Depression: Yes Depression Symptoms: Insomnia, Tearfulness, Despondent, Isolating, Loss of interest in usual pleasures, Feeling worthless/self pity, Feeling angry/irritable, Guilt, Fatigue Substance abuse history and/or treatment for substance abuse?: No Suicide prevention information given to non-admitted patients: Not applicable  Risk to Others within the past 6 months Homicidal Ideation: Yes-Currently Present Does patient have any lifetime risk of violence toward others beyond the six months prior to admission? : No Thoughts of Harm to Others: Yes-Currently Present Comment - Thoughts of Harm to Others: pt says he wants to get a gun and start shooting random people  Current Homicidal Intent: No Current Homicidal Plan: Yes-Currently Present Describe Current Homicidal Plan: pt states he wants to grab a police officers gun and shot people  Access to Homicidal Means: Yes Describe Access to Homicidal Means: pt has access to police officers  Identified Victim: "anyone" History of harm to others?: No Assessment of Violence: None Noted Does patient have access to weapons?: No Criminal Charges Pending?: No Does patient have a court date: No Is patient on probation?: No  Psychosis Hallucinations: Auditory, With command Delusions: None noted  Mental Status Report Appearance/Hygiene: In scrubs Eye Contact: Good Motor Activity: Freedom of movement Speech: Logical/coherent Level of Consciousness: Alert Mood:  Depressed, Helpless Affect: Depressed Anxiety Level: None Thought Processes: Relevant, Coherent Judgement: Impaired Orientation: Person, Place, Time, Situation, Appropriate for developmental age Obsessive Compulsive Thoughts/Behaviors: None  Cognitive Functioning Concentration: Normal Memory: Recent Intact, Remote Intact Is patient IDD: No Is patient DD?: No Insight: Poor Impulse Control: Poor Appetite: Fair Have you had any weight changes? : No Change Sleep: Decreased Total Hours of Sleep: 3 Vegetative Symptoms: Decreased grooming  ADLScreening [facility] Assessment Services) Patient's cognitive ability adequate to safely complete daily activities?: Yes Patient able to express need for assistance with ADLs?: Yes Independently performs ADLs?: Yes (appropriate for developmental age)  Prior Inpatient Therapy Prior Inpatient Therapy: Yes Prior Therapy Dates: 2019, 2018 Prior [facility] Reason for Treatment: SCHIZOAFFECTIVE  Prior Outpatient Therapy Prior Outpatient Therapy: Yes Prior Therapy Dates: Current. Prior Therapy [facility]ilty[facility]Provider([facility]Dr. Omelia BlackwaterHeaden, MD) Reason for Treatment: Medication management and counseling.  Does patient have an ACCT team?: No Does patient have Intensive In-House Services?  : No Does patient have [facility] services? : No Does patient have P4CC services?: No  ADL Screening (condition at time of admission) Patient's cognitive ability adequate to safely complete daily activities?: Yes Is the patient deaf or have difficulty hearing?: No Does the patient have difficulty seeing, even when wearing glasses/contacts?: No Does the patient have difficulty concentrating, remembering, or making decisions?: No Patient able to express need for  assistance with ADLs?: Yes Does the patient have difficulty dressing or bathing?: No Independently performs ADLs?: Yes (appropriate for developmental age) Does the patient have  difficulty walking or climbing stairs?: Yes(pt obese ) Weakness of Legs: Both(pt obese) Weakness of Arms/Hands: None  Home Assistive Devices/Equipment Home Assistive Devices/Equipment: None    Abuse/Neglect Assessment (Assessment to be complete while patient is alone) Abuse/Neglect Assessment Can Be Completed: Yes Physical Abuse: Denies Verbal Abuse: Denies Sexual Abuse: Denies Exploitation of patient/patient's resources: Denies Self-Neglect: Denies     Merchant navy officer (For Healthcare) Does Patient Have a Medical Advance Directive?: No Would patient like information on creating a medical advance directive?: No - Patient declined    Additional Information 1:1 In Past 12 Months?: No CIRT Risk: No Elopement Risk: No Does patient have medical clearance?: Yes     Disposition: TTS consulted with Donell Sievert, PA who recommends inpt treatment. Pt's nurse Thurnell Garbe, RN and EDP Garlon Hatchet, PA-C have been advised of the disposition. TTS to seek placement.  Disposition Initial Assessment Completed for this Encounter: Yes Disposition of Patient: Admit Type of inpatient treatment program: Adult(per Donell Sievert, PA) Patient refused recommended treatment: No  This service was provided via telemedicine using a 2-way, interactive audio and video technology.  Names of all persons participating in this telemedicine service and their role in this encounter. Name:  Allen Sosa Role: Patient  Name: Princess Bruins Role: TTS          Karolee Ohs 06/22/2018 5:18 AM

## 2018-06-22 NOTE — Progress Notes (Addendum)
37 yo male who returned to the ED again with suicidal ideations earlier.  [facility] and when discharged, returns with psychiatric issues.  Cleared yesterday [facility]  Appears to be feigning symptoms to obtain shelter as he is homeless at this time.  He is not responding to internal stimuli on assessment and agreeable to plan to discharge in the morning and follow [facility].  Medications restarted, adjusted (Seroquel increased) and should discharge in the morning.  Social work consult placed as his big issue is shelter.    Nanine MeansJamison Kamaree Sosa, PMHNP

## 2018-06-22 NOTE — Progress Notes (Signed)
TTS consulted with Allen SievertSpencer Simon, PA who recommends inpt treatment. Pt's nurse Thurnell Garbearter, Kayla H, RN and EDP Garlon HatchetSanders, Lisa M, PA-C have been advised of the disposition. TTS to seek placement.  Princess BruinsAquicha Saliha Salts, MSW, LCSW Therapeutic Triage Specialist  [phone]

## 2018-06-22 NOTE — ED Notes (Signed)
Diet coke and Malawiturkey sandwich given.

## 2018-06-22 NOTE — Progress Notes (Signed)
Patient meets criteria for inpatient treatment. There are currently no [facility] beds available at [facility]. CSW faxed referrals to the following facilities for review.  [facility] [place], [address] [facility], [place], [place], [address] [place], [place], [facility] [place] [place], [place], [address], [place], [address], [place], [place], [place], [place], [facility], [place], [facility], [place], and [place].    TTS will continue to seek bed placement.     Moss McKy-sha Jesilyn Easom, MSW, LCSW, LCAS 06/22/2018 8:56 AM

## 2018-06-22 NOTE — ED Notes (Signed)
Pt ambulatory to bathroom

## 2018-06-22 NOTE — B[facility] Counselor (Addendum)
Re-Assessment:   During re-assessment pt appeared to be very depressed. Pt denied HI. When asked "what brought him into the hospital" pt responded " I just separated from wife and I can't find a job" . Pt stated he has been depressed all his life. Pt stated that he did just fell asleep a few minutes ago. Pt stated that he continues to feel suicidal. Pt stated that his plan is to "grab a cop gun". Pt stated " I feel like this is a dream". Writer asked pt if he was hearing or seeing things that others do not, pt responded" I hear signs" . Pt also stated he hears "conversations" however could not give any detail. Pt stated he has been hospitalized previous for SI attempt however he could not give any detail as to his plan. Pt stated [facility] and that his medications were recently changed, so he is not sure if they are working yet. Pt stated if discharged he cannot contract for safety.   Pt continues to meet inpatient criteria.   Allen Sosa [facility], [facility]  Therapeutic Triage Specialist  [phone]

## 2018-06-22 NOTE — ED Notes (Signed)
Pt changed into burgundy scrubs, belongings bagged and labeled- placed in LOCKER 4.Security called to wand pt.  Staffing notified of BH sitter need and charge RN notified of no sitter available from staffing at this time.

## 2018-06-22 NOTE — ED Notes (Signed)
[facility]

## 2018-06-23 ENCOUNTER — Other Ambulatory Visit: Payer: Self-pay

## 2018-06-23 NOTE — ED Notes (Signed)
ALL belongings - 1 belongings bag, suitcase, and 1 valuables envelope - returned to pt. Pt signed verifying all items present. Pt declined to shower. Pt given wash cloth so he may wash his face as requested. Bus pass given as requested. Pt called [facility] - [address] - and verified his prescriptions are ready for him to pick up. Pt voiced understanding of taking meds and following up as recommended.

## 2018-06-23 NOTE — Discharge Instructions (Signed)
Follow up with the recommendations provided by the psychiatry team

## 2018-06-23 NOTE — ED Provider Notes (Signed)
Pt presented with recurrent depression.  Cleared by psychiatry.  Stable for discharge   Allen Sosa, Allen Meisenheimer, MD 06/23/18 [phone]

## 2018-06-23 NOTE — ED Notes (Addendum)
Woke pt - advised breakfast tray on bedside table - encouraged pt to eat prior to be d/c'd. Pt voiced understanding of d/c and advised he spoke w/J Shaune PollackLord, NP, [facility], yesterday. Pt denies SI/HI, AH/VH. Pt asking to be given his meds prior to being d/c'd.

## 2019-05-09 IMAGING — DX DG CHEST 2V
2 series · 2 of 2 positions shown · non-contrast
Comparison: Chest radiograph performed 11/04/2017

CLINICAL DATA: Acute onset of generalized chest pain and shortness
of breath. High blood pressure.

EXAM:
CHEST - 2 VIEW

[chest pa]
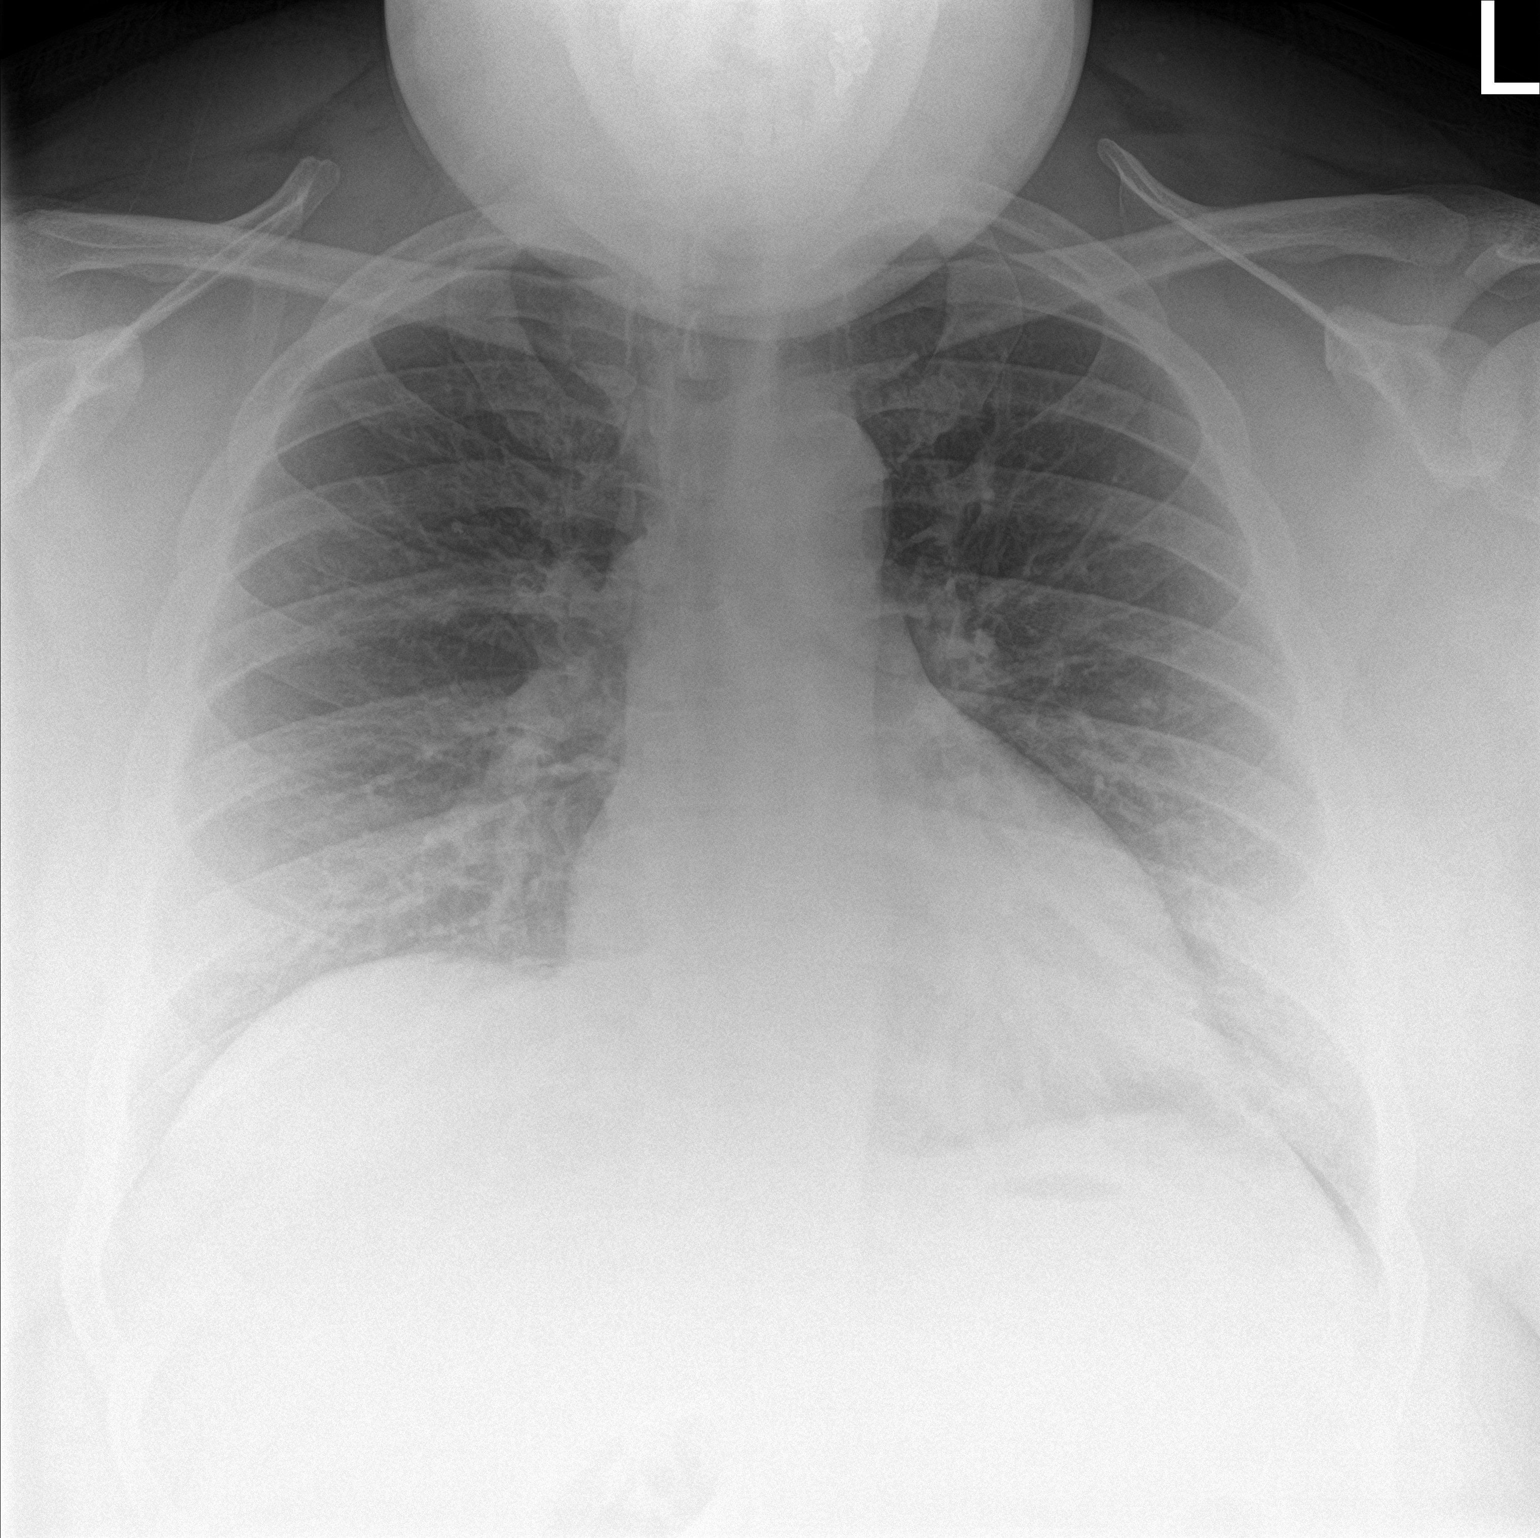

[chest lat]
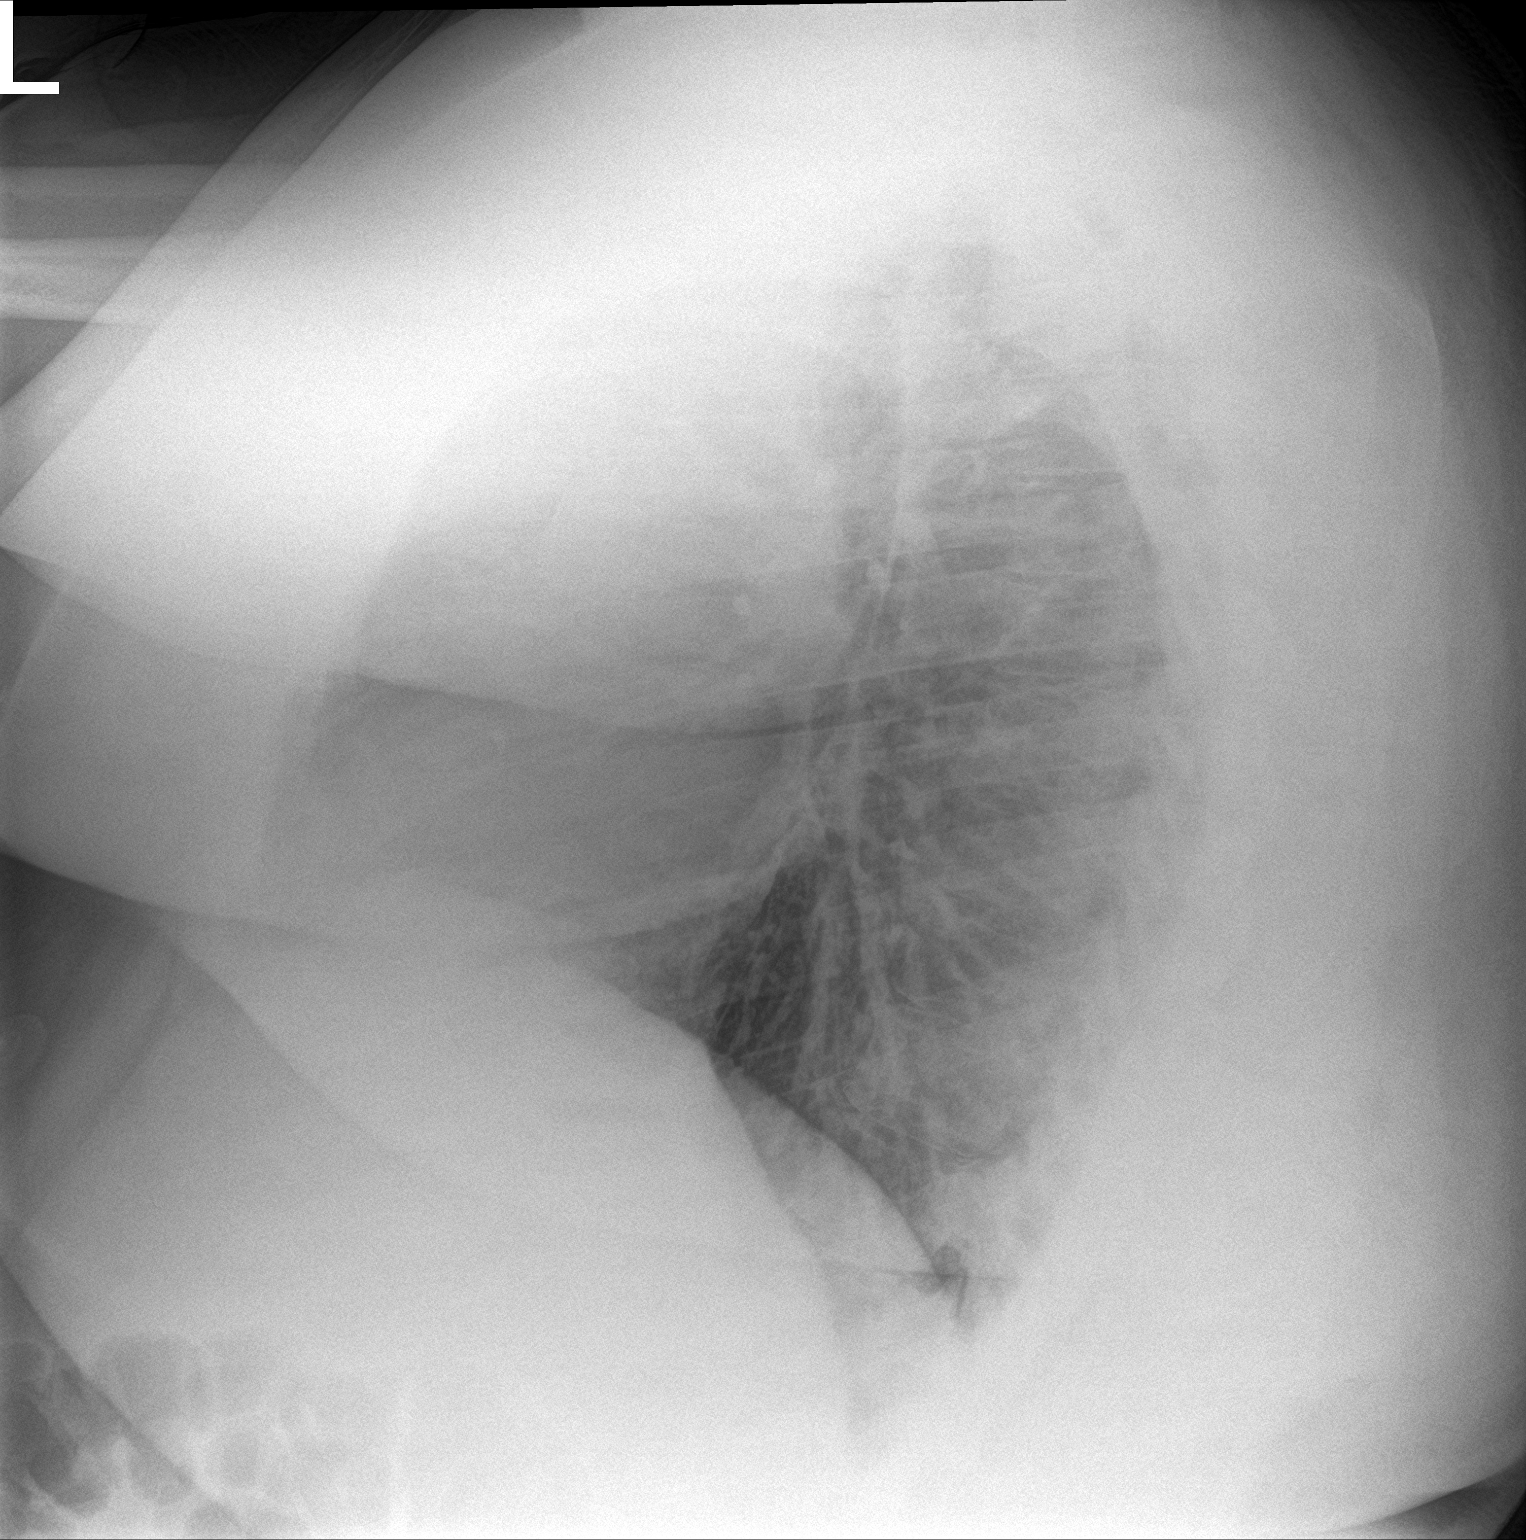

[2 of 2 positions shown; findings below may reference images not displayed]

FINDINGS: There is mild elevation of the right hemidiaphragm.

Mild vascular congestion is noted. No pleural effusion or
pneumothorax is seen.

The heart is normal in size. No acute osseous abnormalities are
seen.
IMPRESSION: Mild elevation of the right hemidiaphragm. Mild vascular congestion
noted. Lungs remain grossly clear.

## 2019-05-09 IMAGING — CT CT ANGIO CHEST
2 of 9 series · 18 of 36 positions shown · IV contrast (OMNI)
Comparison: CT abdomen 01/22/2018

CLINICAL DATA: Chest pain radiating to left side which
shortness-of-breath beginning yesterday.

EXAM:
CT ANGIOGRAPHY CHEST WITH CONTRAST
TECHNIQUE: Multidetector CT imaging of the chest was performed using the
standard protocol during bolus administration of intravenous
contrast. Multiplanar CT image reconstructions and MIPs were
obtained to evaluate the vascular anatomy.
CONTRAST:  100mL 1L4MLM-43L IOPAMIDOL (1L4MLM-43L) INJECTION 76%

[Series 7: thins · axial · 0.82mm/px · z∈[+1119,+1395]mm · 17 of 312 slices shown]
[im 18/312  lung]
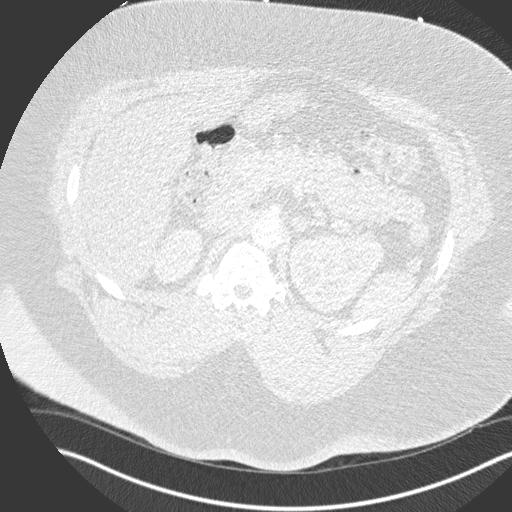
[im 35/312  mediastinal]
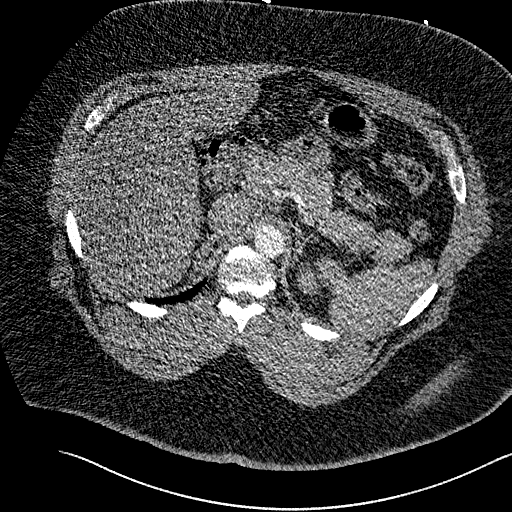
[im 52/312  lung]
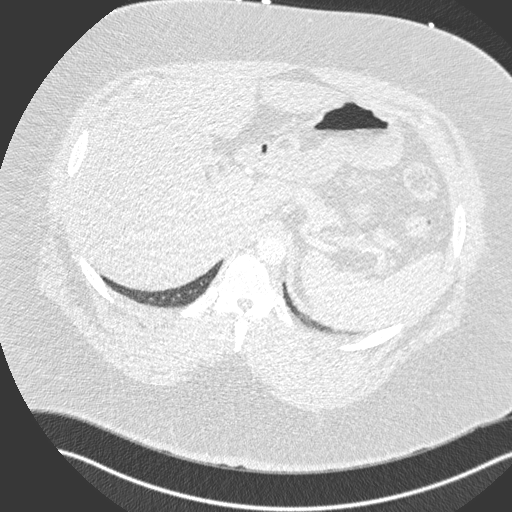
[im 70/312  mediastinal]
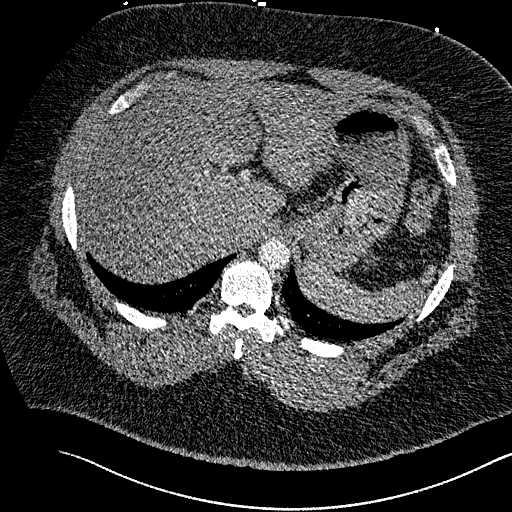
[im 87/312  lung]
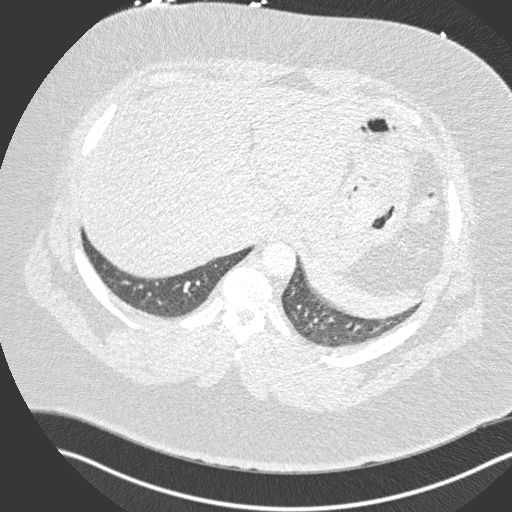
[im 104/312  mediastinal]
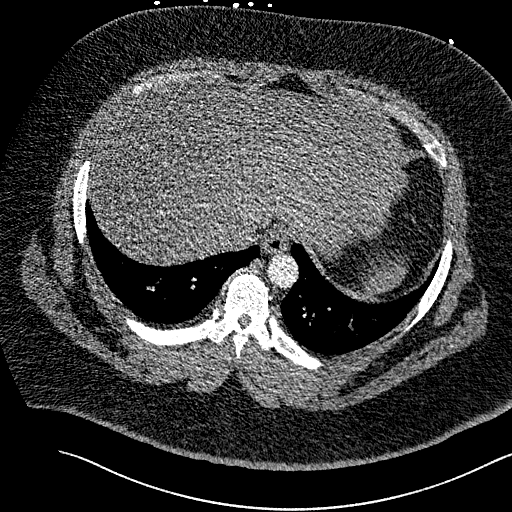
[im 121/312  lung]
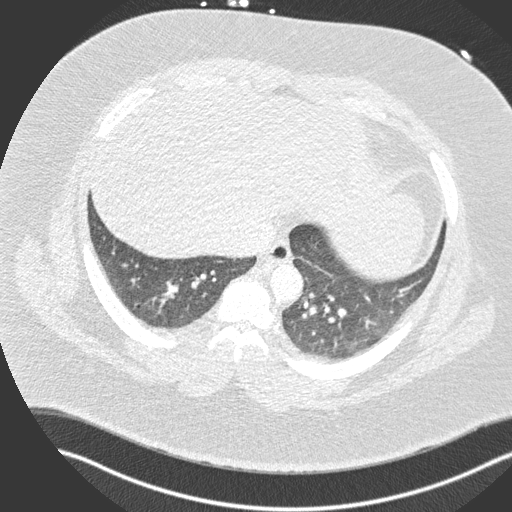
[im 139/312  mediastinal]
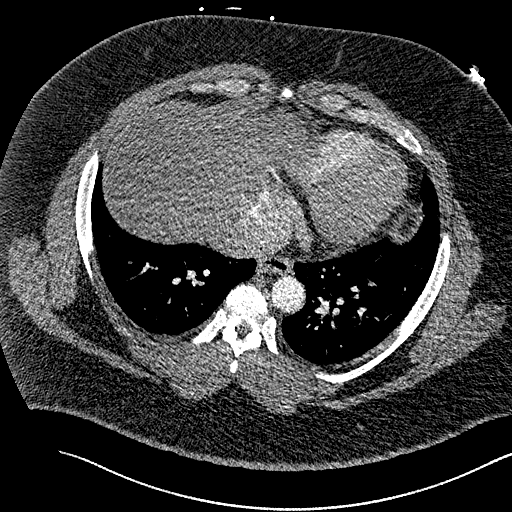
[im 156/312  lung]
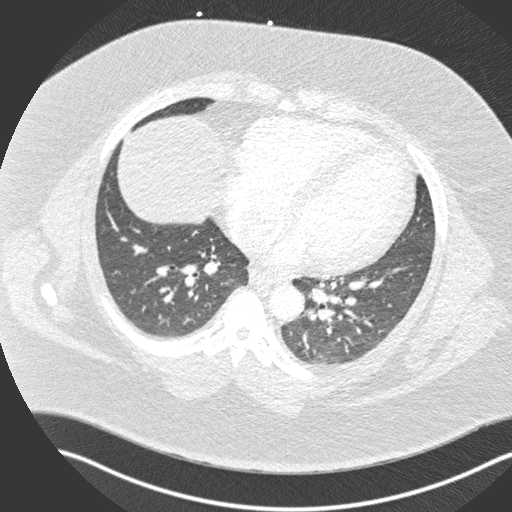
[im 173/312  mediastinal]
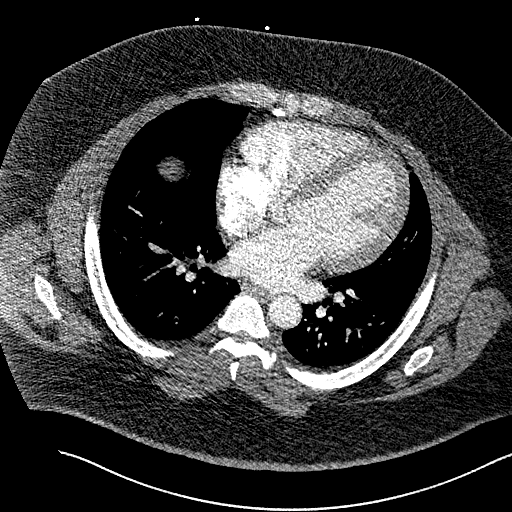
[im 191/312  lung]
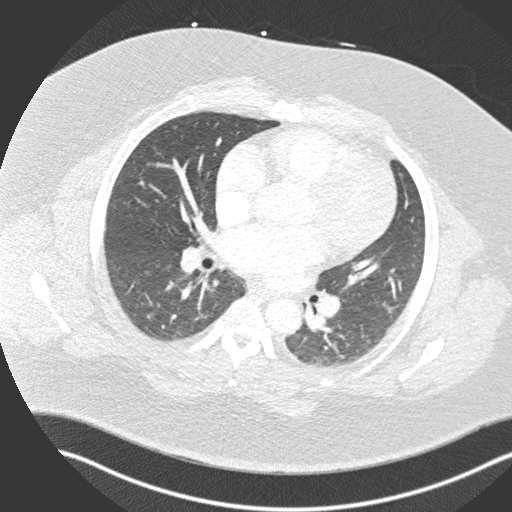
[im 208/312  mediastinal]
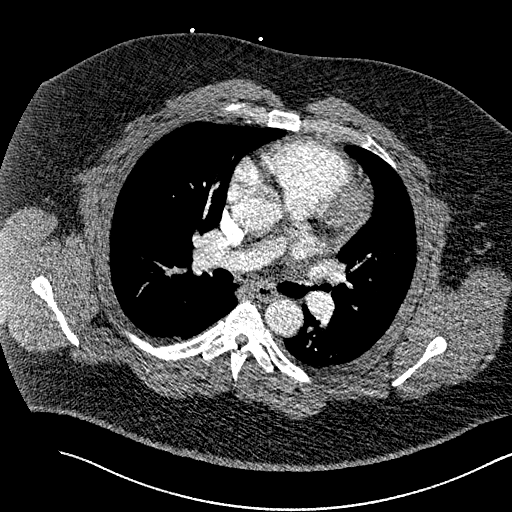
[im 225/312  lung]
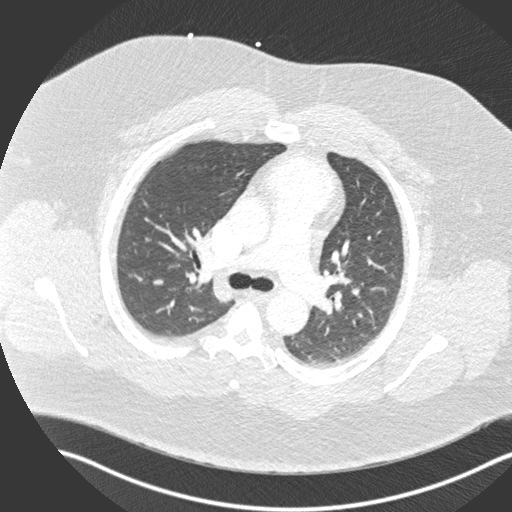
[im 242/312  mediastinal]
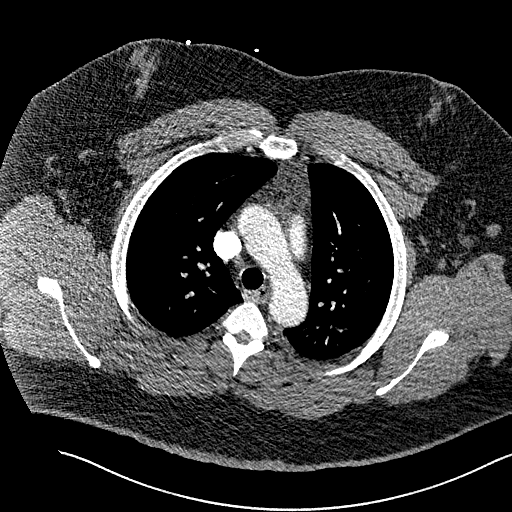
[im 260/312  lung]
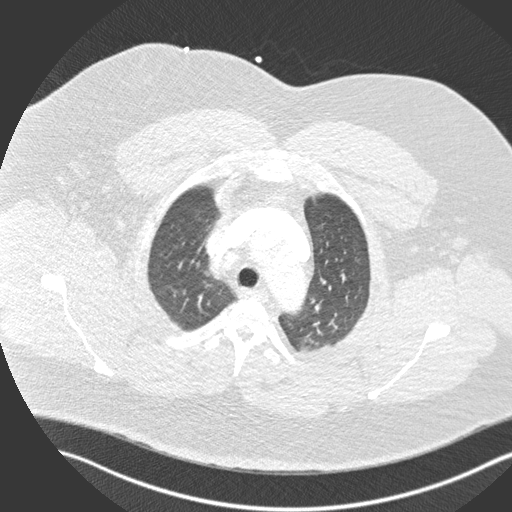
[im 277/312  mediastinal]
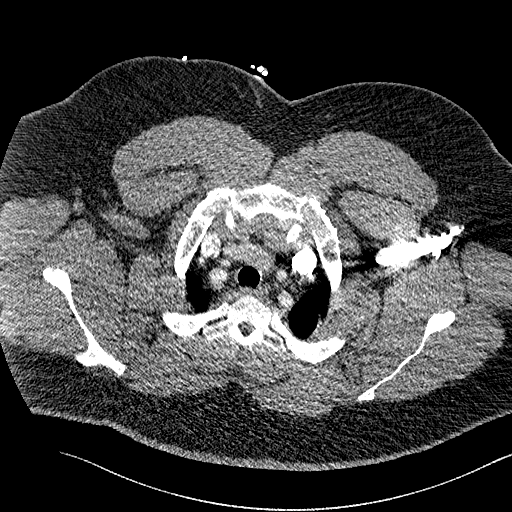
[im 294/312  lung]
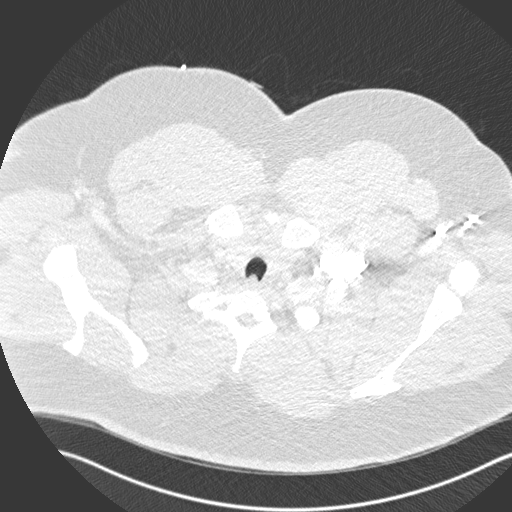

[Series 9: coronal mpr · coronal · 0.61mm/px · 1 of 133 slices shown]
[im 67/133  mediastinal]
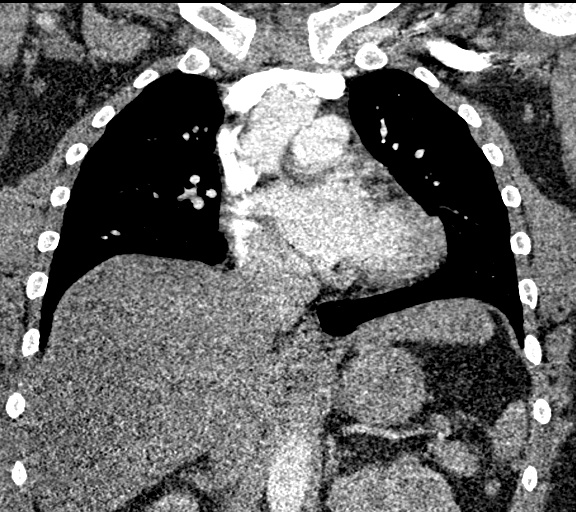

[18 of 36 positions shown; findings below may reference images not displayed]

FINDINGS: Cardiovascular: Mild cardiomegaly. Thoracic aorta is normal.
Opacification of the pulmonary arterial system is not optimal,
although is diagnostic as there is no evidence of pulmonary emboli.

Mediastinum/Nodes: No mediastinal or hilar adenopathy. Remaining
mediastinal structures are normal.

Lungs/Pleura: Lungs are adequately inflated without consolidation or
effusion. No pneumothorax. Airways are normal

Upper Abdomen: No acute abnormality.

Musculoskeletal: Unremarkable bony structures. Prominent overlying
soft tissues.

Review of the MIP images confirms the above findings.
IMPRESSION: No acute cardiopulmonary disease and no evidence of pulmonary
embolism.
